# Patient Record
Sex: Female | Born: 1940 | Race: White | Hispanic: No | Smoking: Former smoker
Health system: Southern US, Community
[De-identification: ages and names within clinical notes are randomized; demographics above are authoritative.]

## PROBLEM LIST (undated history)

## (undated) DIAGNOSIS — E119 Type 2 diabetes mellitus without complications: Secondary | ICD-10-CM

## (undated) DIAGNOSIS — Z972 Presence of dental prosthetic device (complete) (partial): Secondary | ICD-10-CM

## (undated) DIAGNOSIS — I2694 Multiple subsegmental pulmonary emboli without acute cor pulmonale: Secondary | ICD-10-CM

## (undated) DIAGNOSIS — F329 Major depressive disorder, single episode, unspecified: Secondary | ICD-10-CM

## (undated) DIAGNOSIS — J449 Chronic obstructive pulmonary disease, unspecified: Secondary | ICD-10-CM

## (undated) DIAGNOSIS — H353131 Nonexudative age-related macular degeneration, bilateral, early dry stage: Secondary | ICD-10-CM

## (undated) DIAGNOSIS — J45909 Unspecified asthma, uncomplicated: Secondary | ICD-10-CM

## (undated) DIAGNOSIS — F419 Anxiety disorder, unspecified: Secondary | ICD-10-CM

## (undated) DIAGNOSIS — C3490 Malignant neoplasm of unspecified part of unspecified bronchus or lung: Secondary | ICD-10-CM

## (undated) DIAGNOSIS — I10 Essential (primary) hypertension: Secondary | ICD-10-CM

## (undated) DIAGNOSIS — K08109 Complete loss of teeth, unspecified cause, unspecified class: Secondary | ICD-10-CM

## (undated) DIAGNOSIS — M8580 Other specified disorders of bone density and structure, unspecified site: Secondary | ICD-10-CM

## (undated) DIAGNOSIS — I719 Aortic aneurysm of unspecified site, without rupture: Secondary | ICD-10-CM

## (undated) DIAGNOSIS — H9191 Unspecified hearing loss, right ear: Secondary | ICD-10-CM

## (undated) DIAGNOSIS — Z974 Presence of external hearing-aid: Secondary | ICD-10-CM

## (undated) DIAGNOSIS — R42 Dizziness and giddiness: Secondary | ICD-10-CM

## (undated) DIAGNOSIS — E1169 Type 2 diabetes mellitus with other specified complication: Secondary | ICD-10-CM

## (undated) DIAGNOSIS — F32A Depression, unspecified: Secondary | ICD-10-CM

## (undated) DIAGNOSIS — Z7901 Long term (current) use of anticoagulants: Secondary | ICD-10-CM

## (undated) DIAGNOSIS — I8393 Asymptomatic varicose veins of bilateral lower extremities: Secondary | ICD-10-CM

## (undated) DIAGNOSIS — E785 Hyperlipidemia, unspecified: Secondary | ICD-10-CM

## (undated) DIAGNOSIS — C801 Malignant (primary) neoplasm, unspecified: Secondary | ICD-10-CM

## (undated) DIAGNOSIS — G510 Bell's palsy: Secondary | ICD-10-CM

## (undated) HISTORY — DX: Malignant (primary) neoplasm, unspecified: C80.1

## (undated) HISTORY — DX: Type 2 diabetes mellitus without complications: E11.9

## (undated) HISTORY — PX: PARTIAL HYSTERECTOMY: SHX80

## (undated) HISTORY — DX: Depression, unspecified: F32.A

## (undated) HISTORY — PX: TUBAL LIGATION: SHX77

## (undated) HISTORY — DX: Bell's palsy: G51.0

## (undated) HISTORY — PX: OTHER SURGICAL HISTORY: SHX169

## (undated) HISTORY — DX: Chronic obstructive pulmonary disease, unspecified: J44.9

## (undated) HISTORY — DX: Essential (primary) hypertension: I10

## (undated) HISTORY — PX: CATARACT EXTRACTION W/ INTRAOCULAR LENS  IMPLANT, BILATERAL: SHX1307

## (undated) HISTORY — DX: Anxiety disorder, unspecified: F41.9

## (undated) HISTORY — DX: Aortic aneurysm of unspecified site, without rupture: I71.9

## (undated) HISTORY — DX: Hyperlipidemia, unspecified: E78.5

## (undated) HISTORY — PX: ABDOMINAL HYSTERECTOMY: SHX81

---
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
## 1974-09-07 HISTORY — PX: BREAST EXCISIONAL BIOPSY: SUR124

## 2005-02-18 ENCOUNTER — Ambulatory Visit: Payer: Self-pay | Admitting: General Practice

## 2007-02-09 ENCOUNTER — Ambulatory Visit: Payer: Self-pay

## 2008-01-26 ENCOUNTER — Ambulatory Visit: Payer: Self-pay | Admitting: Gastroenterology

## 2009-02-07 ENCOUNTER — Ambulatory Visit: Payer: Self-pay | Admitting: Family Medicine

## 2010-01-26 ENCOUNTER — Emergency Department: Payer: Self-pay | Admitting: Emergency Medicine

## 2010-02-06 ENCOUNTER — Ambulatory Visit: Payer: Self-pay | Admitting: Family Medicine

## 2012-09-07 HISTORY — PX: COLONOSCOPY: SHX174

## 2012-12-19 ENCOUNTER — Ambulatory Visit: Payer: Self-pay | Admitting: Family Medicine

## 2013-01-05 ENCOUNTER — Ambulatory Visit: Payer: Self-pay | Admitting: Family Medicine

## 2013-02-05 ENCOUNTER — Ambulatory Visit: Payer: Self-pay | Admitting: Family Medicine

## 2013-03-07 ENCOUNTER — Ambulatory Visit: Payer: Self-pay | Admitting: Family Medicine

## 2013-04-11 ENCOUNTER — Ambulatory Visit: Payer: Self-pay | Admitting: Family Medicine

## 2013-05-04 LAB — HM DEXA SCAN

## 2013-05-08 ENCOUNTER — Ambulatory Visit: Payer: Self-pay | Admitting: Family Medicine

## 2013-05-11 ENCOUNTER — Ambulatory Visit: Payer: Self-pay | Admitting: Family Medicine

## 2013-06-01 ENCOUNTER — Ambulatory Visit: Payer: Self-pay | Admitting: Family Medicine

## 2013-11-23 ENCOUNTER — Ambulatory Visit: Payer: Self-pay | Admitting: Family Medicine

## 2013-11-23 LAB — HM MAMMOGRAPHY

## 2014-06-14 LAB — LIPID PANEL
Cholesterol: 269 mg/dL — AB (ref 0–200)
HDL: 57 mg/dL (ref 35–70)
LDL Cholesterol: 181 mg/dL
Triglycerides: 156 mg/dL (ref 40–160)

## 2014-06-14 LAB — BASIC METABOLIC PANEL
Creatinine: 0.8 mg/dL (ref <=1.1)
Glucose: 129 mg/dL

## 2014-06-14 LAB — HEMOGLOBIN A1C: HEMOGLOBIN A1C: 6.6 % — AB (ref 4.0–6.0)

## 2014-07-20 ENCOUNTER — Ambulatory Visit: Payer: Self-pay | Admitting: Gastroenterology

## 2014-07-20 LAB — HM COLONOSCOPY

## 2014-12-31 DIAGNOSIS — E119 Type 2 diabetes mellitus without complications: Secondary | ICD-10-CM | POA: Insufficient documentation

## 2014-12-31 DIAGNOSIS — M8580 Other specified disorders of bone density and structure, unspecified site: Secondary | ICD-10-CM | POA: Insufficient documentation

## 2014-12-31 DIAGNOSIS — F329 Major depressive disorder, single episode, unspecified: Secondary | ICD-10-CM | POA: Insufficient documentation

## 2014-12-31 DIAGNOSIS — J45909 Unspecified asthma, uncomplicated: Secondary | ICD-10-CM | POA: Insufficient documentation

## 2014-12-31 DIAGNOSIS — I10 Essential (primary) hypertension: Secondary | ICD-10-CM | POA: Insufficient documentation

## 2014-12-31 DIAGNOSIS — F32A Depression, unspecified: Secondary | ICD-10-CM | POA: Insufficient documentation

## 2014-12-31 DIAGNOSIS — Z0000 Encounter for general adult medical examination without abnormal findings: Secondary | ICD-10-CM | POA: Insufficient documentation

## 2014-12-31 DIAGNOSIS — E7849 Other hyperlipidemia: Secondary | ICD-10-CM | POA: Insufficient documentation

## 2015-01-07 DIAGNOSIS — E1143 Type 2 diabetes mellitus with diabetic autonomic (poly)neuropathy: Secondary | ICD-10-CM | POA: Diagnosis not present

## 2015-01-07 DIAGNOSIS — E784 Other hyperlipidemia: Secondary | ICD-10-CM | POA: Diagnosis not present

## 2015-01-07 DIAGNOSIS — F325 Major depressive disorder, single episode, in full remission: Secondary | ICD-10-CM | POA: Diagnosis not present

## 2015-01-07 DIAGNOSIS — I10 Essential (primary) hypertension: Secondary | ICD-10-CM | POA: Diagnosis not present

## 2015-01-07 DIAGNOSIS — M8580 Other specified disorders of bone density and structure, unspecified site: Secondary | ICD-10-CM | POA: Diagnosis not present

## 2015-04-04 ENCOUNTER — Other Ambulatory Visit: Payer: Self-pay | Admitting: Family Medicine

## 2015-04-04 DIAGNOSIS — E119 Type 2 diabetes mellitus without complications: Secondary | ICD-10-CM

## 2015-05-30 ENCOUNTER — Other Ambulatory Visit: Payer: Self-pay | Admitting: Family Medicine

## 2015-06-17 ENCOUNTER — Encounter: Payer: Self-pay | Admitting: Family Medicine

## 2015-06-17 ENCOUNTER — Ambulatory Visit (INDEPENDENT_AMBULATORY_CARE_PROVIDER_SITE_OTHER): Payer: Medicare Other | Admitting: Family Medicine

## 2015-06-17 VITALS — BP 120/82 | HR 64 | Ht 63.0 in | Wt 139.0 lb

## 2015-06-17 DIAGNOSIS — Z1239 Encounter for other screening for malignant neoplasm of breast: Secondary | ICD-10-CM | POA: Diagnosis not present

## 2015-06-17 DIAGNOSIS — R928 Other abnormal and inconclusive findings on diagnostic imaging of breast: Secondary | ICD-10-CM

## 2015-06-17 DIAGNOSIS — E119 Type 2 diabetes mellitus without complications: Secondary | ICD-10-CM

## 2015-06-17 DIAGNOSIS — Z23 Encounter for immunization: Secondary | ICD-10-CM | POA: Diagnosis not present

## 2015-06-17 DIAGNOSIS — E784 Other hyperlipidemia: Secondary | ICD-10-CM

## 2015-06-17 DIAGNOSIS — E7849 Other hyperlipidemia: Secondary | ICD-10-CM

## 2015-06-17 DIAGNOSIS — I10 Essential (primary) hypertension: Secondary | ICD-10-CM | POA: Diagnosis not present

## 2015-06-17 DIAGNOSIS — F419 Anxiety disorder, unspecified: Secondary | ICD-10-CM | POA: Diagnosis not present

## 2015-06-17 DIAGNOSIS — J4520 Mild intermittent asthma, uncomplicated: Secondary | ICD-10-CM

## 2015-06-17 DIAGNOSIS — M8580 Other specified disorders of bone density and structure, unspecified site: Secondary | ICD-10-CM | POA: Diagnosis not present

## 2015-06-17 LAB — POCT URINALYSIS DIPSTICK
Bilirubin, UA: NEGATIVE
GLUCOSE UA: NEGATIVE
Ketones, UA: NEGATIVE
Leukocytes, UA: NEGATIVE
Nitrite, UA: NEGATIVE
PH UA: 6
Protein, UA: NEGATIVE
RBC UA: NEGATIVE
SPEC GRAV UA: 1.01
UROBILINOGEN UA: 0.2

## 2015-06-17 MED ORDER — LISINOPRIL-HYDROCHLOROTHIAZIDE 20-25 MG PO TABS: 1.0000 | ORAL_TABLET | Freq: Every day | ORAL | Status: DC

## 2015-06-17 MED ORDER — ALBUTEROL SULFATE HFA 108 (90 BASE) MCG/ACT IN AERS: 1.0000 | INHALATION_SPRAY | Freq: Four times a day (QID) | RESPIRATORY_TRACT | Status: DC

## 2015-06-17 MED ORDER — METFORMIN HCL 500 MG PO TABS: 500.0000 mg | ORAL_TABLET | Freq: Two times a day (BID) | ORAL | Status: DC

## 2015-06-17 MED ORDER — FLUOXETINE HCL 10 MG PO CAPS: 10.0000 mg | ORAL_CAPSULE | Freq: Every day | ORAL | Status: DC

## 2015-06-17 MED ORDER — ALENDRONATE SODIUM 70 MG PO TABS: 70.0000 mg | ORAL_TABLET | ORAL | Status: DC

## 2015-06-17 NOTE — Progress Notes (Signed)
Patient: Jill Guzman, Female    DOB: 09-24-1940, 74 y.o.   MRN: [id number] Visit Date: 06/17/2015  Today's Provider: Otilio Miu, MD   Chief Complaint  Patient presents with  . Medicare Wellness    needs mammo  . Diabetes  . Hypertension  . Anxiety   Subjective:   Initial preventative physical exam Jill Guzman is a 74 y.o. female who presents today for her Initial Preventative Physical Exam. She feels well. She reports exercising as with yard work. She reports she is sleeping well.  Diabetes She presents for her follow-up diabetic visit. She has type 2 diabetes mellitus. Her disease course has been improving. Pertinent negatives for hypoglycemia include no confusion, dizziness, headaches, nervousness/anxiousness, seizures, speech difficulty or tremors. Pertinent negatives for diabetes include no blurred vision, no chest pain, no fatigue, no foot paresthesias, no foot ulcerations, no polydipsia, no polyphagia, no polyuria, no visual change, no weakness and no weight loss. There are no hypoglycemic complications. Symptoms are stable. There are no diabetic complications. Pertinent negatives for diabetic complications include no CVA, PVD or retinopathy. Current diabetic treatment includes oral agent (monotherapy). Her weight is stable. She is following a generally healthy diet. She participates in exercise intermittently. Her breakfast blood glucose is taken between 7-8 am. Her breakfast blood glucose range is generally 130-140 mg/dl. An ACE inhibitor/angiotensin II receptor blocker is being taken. Eye exam is not current.  Hypertension This is a chronic problem. The current episode started more than 1 year ago. The problem has been gradually improving since onset. The problem is controlled. Associated symptoms include anxiety. Pertinent negatives include no blurred vision, chest pain, headaches, malaise/fatigue, neck pain, orthopnea, palpitations, PND or shortness of breath. There are no  associated agents to hypertension. Risk factors for coronary artery disease include diabetes mellitus, post-menopausal state and dyslipidemia. Past treatments include diuretics. The current treatment provides mild improvement. There is no history of angina, kidney disease, CAD/MI, CVA, heart failure, left ventricular hypertrophy, PVD, renovascular disease or retinopathy. There is no history of chronic renal disease.  Anxiety Presents for follow-up visit. Patient reports no chest pain, compulsions, confusion, depressed mood, dizziness, excessive worry, feeling of choking, insomnia, muscle tension, nausea, nervous/anxious behavior, palpitations, shortness of breath or suicidal ideas. Symptoms occur occasionally. The severity of symptoms is mild. The quality of sleep is fair.   There are no known risk factors. There is no history of anemia, anxiety/panic attacks, arrhythmia, asthma, bipolar disorder, CAD, CHF or depression. Past treatments include SSRIs.    Review of Systems  Constitutional: Negative for fever, chills, weight loss, malaise/fatigue, fatigue and unexpected weight change.  HENT: Negative for ear pain, hearing loss, nosebleeds, sneezing, sore throat and trouble swallowing.   Eyes: Negative for blurred vision, photophobia, pain, redness, itching and visual disturbance.  Respiratory: Negative for cough, chest tightness, shortness of breath and wheezing.   Cardiovascular: Negative for chest pain, palpitations, orthopnea, leg swelling and PND.  Gastrointestinal: Negative for nausea, vomiting, abdominal pain, diarrhea, constipation, blood in stool and rectal pain.  Endocrine: Negative for cold intolerance, heat intolerance, polydipsia, polyphagia and polyuria.  Genitourinary: Negative for dysuria, hematuria, flank pain, vaginal bleeding, vaginal discharge, difficulty urinating, menstrual problem and pelvic pain.  Musculoskeletal: Negative for back pain, joint swelling, neck pain and neck  stiffness.  Skin: Negative for color change and rash.  Allergic/Immunologic: Negative for immunocompromised state.  Neurological: Negative for dizziness, tremors, seizures, syncope, facial asymmetry, speech difficulty, weakness, light-headedness, numbness and headaches.  Hematological: Does not bruise/bleed  easily.  Psychiatric/Behavioral: Negative for suicidal ideas, hallucinations, behavioral problems, confusion, sleep disturbance, self-injury and agitation. The patient is not nervous/anxious and does not have insomnia.     Social History   Social History  . Marital Status: Married    Spouse Name: N/A  . Number of Children: N/A  . Years of Education: N/A   Occupational History  . Not on file.   Social History Main Topics  . Smoking status: Former Research scientist (life sciences)  . Smokeless tobacco: Not on file  . Alcohol Use: No  . Drug Use: No  . Sexual Activity: No   Other Topics Concern  . Not on file   Social History Narrative    Patient Active Problem List   Diagnosis Date Noted  . 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 Routine general medical examination at a health care facility 12/31/2014  . Diabetes ([place]) 12/31/2014  . BP (high blood pressure) 12/31/2014  . Osteopenia 12/31/2014    Past Surgical History  Procedure Laterality Date  . Cyst on bladder removed    . Partial hysterectomy    . Cyst removed breast Left   . Colonoscopy  2014    normal    Her family history includes Diabetes in her mother; Stroke in her mother.    Previous Medications   ACCU-CHEK AVIVA PLUS TEST STRIP    USE ONE STRIP IN VITRO ONCE DAILY   FLAXSEED, LINSEED, (FLAX SEED OIL) 1000 MG CAPS    Take 1 capsule by mouth daily.    Patient Care Team: Juline Patch, MD as PCP - General (Family Medicine)     Objective:   Vitals: BP 120/82 mmHg  Pulse 64  Ht '5\' 3"'$  (1.6 m)  Wt 139 lb (63.05 kg)  BMI  24.63 kg/m2  Physical Exam  Constitutional: She is oriented to person, place, and time. She appears well-developed and well-nourished.  HENT:  Head: Normocephalic.  Right Ear: External ear normal.  Left Ear: External ear normal.  Nose: Nose normal.  Eyes: Conjunctivae and EOM are normal. Pupils are equal, round, and reactive to light.  Neck: Normal range of motion. Neck supple.  Cardiovascular: Normal rate, regular rhythm, normal heart sounds and intact distal pulses.   Pulmonary/Chest: Effort normal and breath sounds normal. Right breast exhibits no inverted nipple, no mass, no nipple discharge, no skin change and no tenderness. Left breast exhibits no inverted nipple, no mass, no nipple discharge, no skin change and no tenderness. Breasts are symmetrical.  Abdominal: Soft. Bowel sounds are normal.  Genitourinary: Vagina normal and uterus normal.  Musculoskeletal: Normal range of motion.  Neurological: She is alert and oriented to person, place, and time. She has normal reflexes.  Skin: Skin is warm and dry.  Psychiatric: She has a normal mood and affect. Her behavior is normal. Judgment and thought content normal.     No exam data present  Activities of Daily Living In your present state of health, do you have any difficulty performing the following activities: 06/17/2015  Hearing? N  Vision? N  Difficulty concentrating or making decisions? N  Walking or climbing stairs? N  Dressing or bathing? N  Doing errands, shopping? N    Fall Risk Assessment Fall Risk  06/17/2015  Falls in the past year? No     Patient reports there are safety devices in place in shower at home.  Depression Screen PHQ 2/9 Scores 06/17/2015  PHQ -  2 Score 1    Cognitive Testing - 6-CIT   Correct? Score   What year is it? yes 0 Yes = 0    No = 4  What month is it? yes 0 Yes = 0    No = 3  Remember:     Pia [address], [place], [place]     What time is it? yes 0 Yes = 0    No = 3  Count  backwards from 20 to 1 yes 0 Correct = 0    1 error = 2   More than 1 error = 4  Say the months of the year in reverse. yes 0 Correct = 0    1 error = 2   More than 1 error = 4  What address did I ask you to remember? yes 0 Correct = 0  1 error = 2    2 error = 4    3 error = 6    4 error = 8    All wrong = 10       TOTAL SCORE  0/28   Interpretation:  Normal  Normal (0-7) Abnormal (8-28)     Assessment & Plan:     Initial Preventative Physical Exam  Reviewed patient's Family Medical History Reviewed and updated list of patient's medical providers Assessment of cognitive impairment was done Assessed patient's functional ability Established a written schedule for health screening [place] Completed and Reviewed  Exercise Activities and Dietary recommendations Goals    None      Immunization History  Administered Date(s) Administered  . Influenza-Unspecified 06/07/2015  . Pneumococcal Conjugate-13 06/17/2015  . Pneumococcal Polysaccharide-23 06/14/2014  . Td 09/07/2006  . Zoster 09/07/2012    Health Maintenance  Topic Date Due  . FOOT EXAM  12/11/1952  . OPHTHALMOLOGY EXAM  12/11/1952  . URINE MICROALBUMIN  12/11/1952  . HEMOGLOBIN A1C  12/14/2014  . MAMMOGRAM  11/24/2015  . INFLUENZA VACCINE  04/07/2016  . TETANUS/TDAP  09/07/2016  . COLONOSCOPY  07/20/2024  . DEXA SCAN  Completed  . ZOSTAVAX  Completed  . PNA vac Low Risk Adult  Completed      Discussed health benefits of physical activity, and encouraged her to engage in regular exercise appropriate for her age and condition.    ------------------------------------------------------------------------------------------------------------   Problem List Items Addressed This Visit      Cardiovascular and Mediastinum   BP (high blood pressure) - Primary   Relevant Medications   lisinopril-hydrochlorothiazide (PRINZIDE,ZESTORETIC) 20-25 MG tablet   Other Relevant Orders   Lipid Profile    Renal Function Panel   POCT Urinalysis Dipstick (Completed)     Respiratory   AB (asthmatic bronchitis)   Relevant Medications   albuterol (PROVENTIL HFA;VENTOLIN HFA) 108 (90 BASE) MCG/ACT inhaler     Endocrine   Diabetes (HCC)   Relevant Medications   lisinopril-hydrochlorothiazide (PRINZIDE,ZESTORETIC) 20-25 MG tablet   metFORMIN (GLUCOPHAGE) 500 MG tablet   Other Relevant Orders   HgB A1c   Lipid Profile     Musculoskeletal and Integument   Osteopenia   Relevant Medications   alendronate (FOSAMAX) 70 MG tablet   Other Relevant Orders   DG Bone Density     Other   Familial multiple lipoprotein-type hyperlipidemia   Relevant Medications   lisinopril-hydrochlorothiazide (PRINZIDE,ZESTORETIC) 20-25 MG tablet   Other Relevant Orders   Lipid Profile    Other Visit Diagnoses    Chronic anxiety  Relevant Medications    FLUoxetine (PROZAC) 10 MG capsule    Breast cancer screening        Relevant Orders    MM Digital Screening    Need for pneumococcal vaccination        Relevant Orders    Pneumococcal conjugate vaccine 13-valent (Completed)    Abnormal mammogram of both breasts        Relevant Orders    MM Digital Diagnostic Bilat    US BREAST LTD UNI LEFT INC AXILLA    US BREAST LTD UNI RIGHT INC AXILLA        Otilio Miu, MD [facility]  06/17/2015

## 2015-06-18 LAB — LIPID PANEL
Chol/HDL Ratio: 4.5 ratio units — ABNORMAL HIGH (ref 0.0–4.4)
Cholesterol, Total: 272 mg/dL — ABNORMAL HIGH (ref 100–199)
HDL: 60 mg/dL (ref >39)
LDL CALC: 185 mg/dL — AB (ref 0–99)
Triglycerides: 137 mg/dL (ref 0–149)
VLDL CHOLESTEROL CAL: 27 mg/dL (ref 5–40)

## 2015-06-18 LAB — RENAL FUNCTION PANEL
ALBUMIN: 4.7 g/dL (ref 3.5–4.8)
BUN/Creatinine Ratio: 21 (ref 11–26)
BUN: 15 mg/dL (ref 8–27)
CO2: 26 mmol/L (ref 18–29)
CREATININE: 0.71 mg/dL (ref 0.57–1.00)
Calcium: 10.6 mg/dL — ABNORMAL HIGH (ref 8.7–10.3)
Chloride: 97 mmol/L (ref 97–108)
GFR calc Af Amer: 98 mL/min/{1.73_m2} (ref >59)
GFR calc non Af Amer: 85 mL/min/{1.73_m2} (ref >59)
GLUCOSE: 148 mg/dL — AB (ref 65–99)
Phosphorus: 2.9 mg/dL (ref 2.5–4.5)
Potassium: 4.6 mmol/L (ref 3.5–5.2)
SODIUM: 140 mmol/L (ref 134–144)

## 2015-06-18 LAB — HEMOGLOBIN A1C
Est. average glucose Bld gHb Est-mCnc: 160 mg/dL
Hgb A1c MFr Bld: 7.2 % — ABNORMAL HIGH (ref 4.8–5.6)

## 2015-06-28 ENCOUNTER — Ambulatory Visit
Admission: RE | Admit: 2015-06-28 | Discharge: 2015-06-28 | Disposition: A | Discharge status: Home / Self Care | Payer: Medicare Other | Source: Ambulatory Visit | Attending: Family Medicine | Admitting: Family Medicine

## 2015-06-28 ENCOUNTER — Other Ambulatory Visit: Payer: Self-pay | Admitting: Family Medicine

## 2015-06-28 DIAGNOSIS — R928 Other abnormal and inconclusive findings on diagnostic imaging of breast: Secondary | ICD-10-CM | POA: Diagnosis not present

## 2015-06-28 DIAGNOSIS — R921 Mammographic calcification found on diagnostic imaging of breast: Secondary | ICD-10-CM | POA: Diagnosis not present

## 2015-07-03 ENCOUNTER — Ambulatory Visit
Admission: RE | Admit: 2015-07-03 | Discharge: 2015-07-03 | Disposition: A | Discharge status: Home / Self Care | Payer: Medicare Other | Source: Ambulatory Visit | Attending: Family Medicine | Admitting: Family Medicine

## 2015-07-03 DIAGNOSIS — Z780 Asymptomatic menopausal state: Secondary | ICD-10-CM | POA: Diagnosis not present

## 2015-07-03 DIAGNOSIS — M8580 Other specified disorders of bone density and structure, unspecified site: Secondary | ICD-10-CM | POA: Diagnosis not present

## 2015-08-19 ENCOUNTER — Other Ambulatory Visit: Payer: Self-pay

## 2015-08-20 ENCOUNTER — Other Ambulatory Visit: Payer: Medicare Other

## 2015-08-20 DIAGNOSIS — E785 Hyperlipidemia, unspecified: Secondary | ICD-10-CM | POA: Diagnosis not present

## 2015-08-20 DIAGNOSIS — E119 Type 2 diabetes mellitus without complications: Secondary | ICD-10-CM

## 2015-08-21 LAB — LIPID PANEL
CHOL/HDL RATIO: 4.7 ratio — AB (ref 0.0–4.4)
Cholesterol, Total: 238 mg/dL — ABNORMAL HIGH (ref 100–199)
HDL: 51 mg/dL (ref >39)
LDL CALC: 161 mg/dL — AB (ref 0–99)
TRIGLYCERIDES: 132 mg/dL (ref 0–149)
VLDL CHOLESTEROL CAL: 26 mg/dL (ref 5–40)

## 2015-08-21 LAB — HEMOGLOBIN A1C
Est. average glucose Bld gHb Est-mCnc: 157 mg/dL
HEMOGLOBIN A1C: 7.1 % — AB (ref 4.8–5.6)

## 2015-08-23 ENCOUNTER — Other Ambulatory Visit: Payer: Self-pay

## 2015-10-25 ENCOUNTER — Other Ambulatory Visit: Payer: Self-pay | Admitting: Family Medicine

## 2015-11-04 ENCOUNTER — Encounter: Payer: Self-pay | Admitting: Family Medicine

## 2015-11-04 ENCOUNTER — Ambulatory Visit (INDEPENDENT_AMBULATORY_CARE_PROVIDER_SITE_OTHER): Payer: Medicare Other | Admitting: Family Medicine

## 2015-11-04 VITALS — BP 138/80 | HR 76 | Temp 98.1°F | Ht 63.0 in | Wt 135.0 lb

## 2015-11-04 DIAGNOSIS — J219 Acute bronchiolitis, unspecified: Secondary | ICD-10-CM

## 2015-11-04 MED ORDER — ALBUTEROL SULFATE HFA 108 (90 BASE) MCG/ACT IN AERS: 2.0000 | INHALATION_SPRAY | Freq: Four times a day (QID) | RESPIRATORY_TRACT | Status: DC | PRN

## 2015-11-04 MED ORDER — GUAIFENESIN-CODEINE 100-10 MG/5ML PO SYRP: 5.0000 mL | ORAL_SOLUTION | Freq: Three times a day (TID) | ORAL | Status: DC | PRN

## 2015-11-04 MED ORDER — DOXYCYCLINE HYCLATE 100 MG PO TABS: 100.0000 mg | ORAL_TABLET | Freq: Two times a day (BID) | ORAL | Status: DC

## 2015-11-04 NOTE — Progress Notes (Signed)
Name: Jill Guzman   MRN: [id number]    DOB: [birth year]   Date:11/04/2015       Progress Note  Subjective  Chief Complaint  Chief Complaint  Patient presents with  . Bronchitis    cough and cong- yellow production    Cough This is a new problem. The current episode started in the past 7 days. The problem has been gradually worsening. The problem occurs every few minutes. The cough is productive of purulent sputum (yellow-green). Associated symptoms include chills, a fever, myalgias, nasal congestion, postnasal drip, shortness of breath and wheezing. Pertinent negatives include no chest pain, ear congestion, ear pain, headaches, heartburn, hemoptysis, rash, sore throat or weight loss. The symptoms are aggravated by cold air. She has tried a beta-agonist inhaler for the symptoms. The treatment provided mild relief. Her past medical history is significant for asthma. There is no history of environmental allergies.    No problem-specific assessment & plan notes found for this encounter.   Past Medical History  Diagnosis Date  . COPD (chronic obstructive pulmonary disease) ([place])   . Hypertension   . Hyperlipidemia   . Diabetes mellitus without complication ([place])   . Anxiety     Past Surgical History  Procedure Laterality Date  . Cyst on bladder removed    . Partial hysterectomy    . Cyst removed breast Left   . Colonoscopy  2014    normal  . Breast excisional biopsy Left 1976    neg    Family History  Problem Relation Age of Onset  . Diabetes Mother   . Stroke Mother     Social History   Social History  . Marital Status: Married    Spouse Name: N/A  . Number of Children: N/A  . Years of Education: N/A   Occupational History  . Not on file.   Social History Main Topics  . Smoking status: Former Research scientist (life sciences)  . Smokeless tobacco: Not on file  . Alcohol Use: No  . Drug Use: No  . Sexual Activity: No   Other Topics Concern  . Not on file   Social History  Narrative    Allergies  Allergen Reactions  . Penicillins   . Sulfa Antibiotics      Review of Systems  Constitutional: Positive for fever and chills. Negative for weight loss and malaise/fatigue.  HENT: Positive for postnasal drip. Negative for ear discharge, ear pain and sore throat.   Eyes: Negative for blurred vision.  Respiratory: Positive for cough, shortness of breath and wheezing. Negative for hemoptysis and sputum production.   Cardiovascular: Negative for chest pain, palpitations and leg swelling.  Gastrointestinal: Negative for heartburn, nausea, abdominal pain, diarrhea, constipation, blood in stool and melena.  Genitourinary: Negative for dysuria, urgency, frequency and hematuria.  Musculoskeletal: Positive for myalgias. Negative for back pain, joint pain and neck pain.  Skin: Negative for rash.  Neurological: Negative for dizziness, tingling, sensory change, focal weakness and headaches.  Endo/Heme/Allergies: Negative for environmental allergies and polydipsia. Does not bruise/bleed easily.  Psychiatric/Behavioral: Negative for depression and suicidal ideas. The patient is not nervous/anxious and does not have insomnia.      Objective  Filed Vitals:   11/04/15 1141  BP: 138/80  Pulse: 76  Temp: 98.1 F (36.7 C)  TempSrc: Oral  Height: '5\' 3"'$  (1.6 m)  Weight: 135 lb (61.236 kg)    Physical Exam  Constitutional: She is well-developed, well-nourished, and in no distress. No distress.  HENT:  Head:  Normocephalic and atraumatic.  Right Ear: External ear normal.  Left Ear: External ear normal.  Nose: Nose normal.  Mouth/Throat: Oropharynx is clear and moist.  Eyes: Conjunctivae and EOM are normal. Pupils are equal, round, and reactive to light. Right eye exhibits no discharge. Left eye exhibits no discharge.  Neck: Normal range of motion. Neck supple. No JVD present. No thyromegaly present.  Cardiovascular: Normal rate, regular rhythm, normal heart sounds and  intact distal pulses.  Exam reveals no gallop and no friction rub.   No murmur heard. Pulmonary/Chest: Effort normal. No respiratory distress. She has wheezes. She has no rales. She exhibits no tenderness.  Abdominal: Soft. Bowel sounds are normal. She exhibits no mass. There is no tenderness. There is no guarding.  Musculoskeletal: Normal range of motion. She exhibits no edema.  Lymphadenopathy:    She has no cervical adenopathy.  Neurological: She is alert. She has normal reflexes.  Skin: Skin is warm and dry. She is not diaphoretic.  Psychiatric: Mood and affect normal.      Assessment & Plan  Problem List Items Addressed This Visit    None    Visit Diagnoses    Bronchiolitis    -  Primary    Relevant Medications    guaiFENesin-codeine (ROBITUSSIN AC) 100-10 MG/5ML syrup    doxycycline (VIBRA-TABS) 100 MG tablet    albuterol (PROVENTIL HFA;VENTOLIN HFA) 108 (90 Base) MCG/ACT inhaler         Dr. Ashlyne Olenick [facility]  11/04/2015

## 2015-12-02 ENCOUNTER — Encounter: Payer: Self-pay | Admitting: Family Medicine

## 2015-12-02 ENCOUNTER — Ambulatory Visit (INDEPENDENT_AMBULATORY_CARE_PROVIDER_SITE_OTHER): Payer: Medicare Other | Admitting: Family Medicine

## 2015-12-02 VITALS — BP 138/80 | HR 60 | Ht 63.0 in | Wt 138.0 lb

## 2015-12-02 DIAGNOSIS — M8580 Other specified disorders of bone density and structure, unspecified site: Secondary | ICD-10-CM | POA: Diagnosis not present

## 2015-12-02 DIAGNOSIS — F329 Major depressive disorder, single episode, unspecified: Secondary | ICD-10-CM

## 2015-12-02 DIAGNOSIS — M545 Low back pain, unspecified: Secondary | ICD-10-CM

## 2015-12-02 DIAGNOSIS — F419 Anxiety disorder, unspecified: Secondary | ICD-10-CM | POA: Diagnosis not present

## 2015-12-02 DIAGNOSIS — E784 Other hyperlipidemia: Secondary | ICD-10-CM

## 2015-12-02 DIAGNOSIS — I10 Essential (primary) hypertension: Secondary | ICD-10-CM | POA: Diagnosis not present

## 2015-12-02 DIAGNOSIS — E119 Type 2 diabetes mellitus without complications: Secondary | ICD-10-CM | POA: Diagnosis not present

## 2015-12-02 DIAGNOSIS — E7849 Other hyperlipidemia: Secondary | ICD-10-CM

## 2015-12-02 DIAGNOSIS — J4520 Mild intermittent asthma, uncomplicated: Secondary | ICD-10-CM

## 2015-12-02 DIAGNOSIS — F32A Depression, unspecified: Secondary | ICD-10-CM

## 2015-12-02 MED ORDER — CYCLOBENZAPRINE HCL 10 MG PO TABS: 10.0000 mg | ORAL_TABLET | Freq: Three times a day (TID) | ORAL | Status: DC | PRN

## 2015-12-02 MED ORDER — METFORMIN HCL 500 MG PO TABS: 500.0000 mg | ORAL_TABLET | Freq: Two times a day (BID) | ORAL | Status: DC

## 2015-12-02 MED ORDER — LISINOPRIL-HYDROCHLOROTHIAZIDE 20-25 MG PO TABS: 1.0000 | ORAL_TABLET | Freq: Every day | ORAL | Status: DC

## 2015-12-02 MED ORDER — ALBUTEROL SULFATE HFA 108 (90 BASE) MCG/ACT IN AERS: 1.0000 | INHALATION_SPRAY | Freq: Four times a day (QID) | RESPIRATORY_TRACT | Status: DC

## 2015-12-02 MED ORDER — OMEGA 3 1000 MG PO CAPS: 1.0000 | ORAL_CAPSULE | Freq: Every day | ORAL | Status: DC

## 2015-12-02 MED ORDER — ALENDRONATE SODIUM 70 MG PO TABS: 70.0000 mg | ORAL_TABLET | ORAL | Status: DC

## 2015-12-02 MED ORDER — FLUOXETINE HCL 10 MG PO CAPS: 10.0000 mg | ORAL_CAPSULE | Freq: Every day | ORAL | Status: DC

## 2015-12-02 NOTE — Progress Notes (Signed)
Name: Jill Guzman   MRN: [id number]    DOB: [birth year]   Date:12/02/2015       Progress Note  Subjective  Chief Complaint  Chief Complaint  Patient presents with  . Hypertension  . Diabetes  . Osteoporosis  . Anxiety    Hypertension This is a chronic problem. The current episode started more than 1 year ago. The problem has been waxing and waning since onset. The problem is controlled. Associated symptoms include anxiety. Pertinent negatives include no blurred vision, chest pain, headaches, malaise/fatigue, neck pain, orthopnea, palpitations, peripheral edema, PND, shortness of breath or sweats. There are no associated agents to hypertension. Risk factors for coronary artery disease include diabetes mellitus, dyslipidemia and post-menopausal state. Past treatments include ACE inhibitors and diuretics. The current treatment provides mild improvement. There are no compliance problems.  There is no history of angina, kidney disease, CAD/MI, CVA, heart failure, left ventricular hypertrophy, PVD, renovascular disease or retinopathy. There is no history of chronic renal disease or a hypertension causing med.  Diabetes She presents for her follow-up diabetic visit. She has type 2 diabetes mellitus. Her disease course has been improving. Pertinent negatives for hypoglycemia include no confusion, dizziness, headaches, hunger, mood changes, nervousness/anxiousness, pallor, seizures, sleepiness, speech difficulty, sweats or tremors. Pertinent negatives for diabetes include no blurred vision, no chest pain, no fatigue, no foot paresthesias, no foot ulcerations, no polydipsia, no polyphagia, no polyuria, no visual change, no weakness and no weight loss. There are no hypoglycemic complications. Symptoms are stable. Pertinent negatives for diabetic complications include no CVA, PVD or retinopathy. Risk factors for coronary artery disease include diabetes mellitus and dyslipidemia. Current diabetic treatment  includes oral agent (monotherapy). She is compliant with treatment all of the time. Her weight is stable. She is following a generally healthy diet. She has not had a previous visit with a dietitian. Her home blood glucose trend is fluctuating minimally. Her breakfast blood glucose is taken between 7-8 am. Her breakfast blood glucose range is generally 110-130 mg/dl. An ACE inhibitor/angiotensin II receptor blocker is being taken. She does not see a podiatrist.Eye exam is not current.  Anxiety Presents for follow-up visit. Symptoms include depressed mood and excessive worry. Patient reports no chest pain, confusion, decreased concentration, dizziness, insomnia, irritability, nausea, nervous/anxious behavior, palpitations, shortness of breath or suicidal ideas. Symptoms occur most days. The severity of symptoms is mild.   Her past medical history is significant for anxiety/panic attacks and depression. There is no history of anemia, arrhythmia, asthma or bipolar disorder. Past treatments include SSRIs. Compliance with prior treatments has been good.  Back Pain This is a recurrent problem. The current episode started more than 1 year ago. The problem has been waxing and waning since onset. The pain is present in the lumbar spine. The pain does not radiate. Pertinent negatives include no abdominal pain, chest pain, dysuria, fever, headaches, tingling, weakness or weight loss.    No problem-specific assessment & plan notes found for this encounter.   Past Medical History  Diagnosis Date  . COPD (chronic obstructive pulmonary disease) ([place])   . Hypertension   . Hyperlipidemia   . Diabetes mellitus without complication ([place])   . Anxiety     Past Surgical History  Procedure Laterality Date  . Cyst on bladder removed    . Partial hysterectomy    . Cyst removed breast Left   . Colonoscopy  2014    normal  . Breast excisional biopsy Left 1976  neg    Family History  Problem Relation Age of  Onset  . Diabetes Mother   . Stroke Mother     Social History   Social History  . Marital Status: Married    Spouse Name: N/A  . Number of Children: N/A  . Years of Education: N/A   Occupational History  . Not on file.   Social History Main Topics  . Smoking status: Former Research scientist (life sciences)  . Smokeless tobacco: Not on file  . Alcohol Use: No  . Drug Use: No  . Sexual Activity: No   Other Topics Concern  . Not on file   Social History Narrative    Allergies  Allergen Reactions  . Penicillins   . Sulfa Antibiotics      Review of Systems  Constitutional: Negative for fever, chills, weight loss, malaise/fatigue, irritability and fatigue.  HENT: Negative for ear discharge, ear pain and sore throat.   Eyes: Negative for blurred vision.  Respiratory: Negative for cough, sputum production, shortness of breath and wheezing.   Cardiovascular: Negative for chest pain, palpitations, orthopnea, leg swelling and PND.  Gastrointestinal: Negative for heartburn, nausea, abdominal pain, diarrhea, constipation, blood in stool and melena.  Genitourinary: Negative for dysuria, urgency, frequency and hematuria.  Musculoskeletal: Negative for myalgias, back pain, joint pain and neck pain.  Skin: Negative for pallor and rash.  Neurological: Negative for dizziness, tingling, tremors, sensory change, focal weakness, seizures, speech difficulty, weakness and headaches.  Endo/Heme/Allergies: Negative for environmental allergies, polydipsia and polyphagia. Does not bruise/bleed easily.  Psychiatric/Behavioral: Negative for depression, suicidal ideas, confusion and decreased concentration. The patient is not nervous/anxious and does not have insomnia.      Objective  Filed Vitals:   12/02/15 0911  BP: 138/80  Pulse: 60  Height: '5\' 3"'$  (1.6 m)  Weight: 138 lb (62.596 kg)    Physical Exam  Constitutional: She is well-developed, well-nourished, and in no distress. No distress.  HENT:  Head:  Normocephalic and atraumatic.  Right Ear: External ear normal.  Left Ear: External ear normal.  Nose: Nose normal.  Mouth/Throat: Oropharynx is clear and moist.  Eyes: Conjunctivae and EOM are normal. Pupils are equal, round, and reactive to light. Right eye exhibits no discharge. Left eye exhibits no discharge.  Neck: Normal range of motion. Neck supple. No JVD present. No thyromegaly present.  Cardiovascular: Normal rate, regular rhythm, normal heart sounds and intact distal pulses.  Exam reveals no gallop and no friction rub.   No murmur heard. Pulmonary/Chest: Effort normal and breath sounds normal.  Abdominal: Soft. Bowel sounds are normal. She exhibits no mass. There is no tenderness. There is no guarding.  Musculoskeletal: Normal range of motion. She exhibits no edema.  Lymphadenopathy:    She has no cervical adenopathy.  Neurological: She is alert. She has normal reflexes.  Skin: Skin is warm and dry. She is not diaphoretic.  Psychiatric: Mood and affect normal.  Nursing note and vitals reviewed.     Assessment & Plan  Problem List Items Addressed This Visit      Cardiovascular and Mediastinum   BP (high blood pressure) - Primary   Relevant Medications   lisinopril-hydrochlorothiazide (PRINZIDE,ZESTORETIC) 20-25 MG tablet   Other Relevant Orders   Renal Function Panel     Respiratory   AB (asthmatic bronchitis)   Relevant Medications   albuterol (PROVENTIL HFA;VENTOLIN HFA) 108 (90 Base) MCG/ACT inhaler     Endocrine   Diabetes (HCC)   Relevant Medications   lisinopril-hydrochlorothiazide (  PRINZIDE,ZESTORETIC) 20-25 MG tablet   metFORMIN (GLUCOPHAGE) 500 MG tablet     Musculoskeletal and Integument   Osteopenia   Relevant Medications   alendronate (FOSAMAX) 70 MG tablet     Other   Familial multiple lipoprotein-type hyperlipidemia   Relevant Medications   lisinopril-hydrochlorothiazide (PRINZIDE,ZESTORETIC) 20-25 MG tablet   Omega 3 1000 MG CAPS   Other  Relevant Orders   Lipid Profile   Clinical depression   Relevant Medications   FLUoxetine (PROZAC) 10 MG capsule    Other Visit Diagnoses    Chronic anxiety        Relevant Medications    FLUoxetine (PROZAC) 10 MG capsule    Bilateral low back pain without sciatica        Relevant Medications    cyclobenzaprine (FLEXERIL) 10 MG tablet      Added A1C to labs   Dr. Mac[facility]  12/02/2015

## 2015-12-03 LAB — RENAL FUNCTION PANEL
Albumin: 4.3 g/dL (ref 3.5–4.8)
BUN / CREAT RATIO: 13 (ref 11–26)
BUN: 10 mg/dL (ref 8–27)
CALCIUM: 9.4 mg/dL (ref 8.7–10.3)
CHLORIDE: 100 mmol/L (ref 96–106)
CO2: 23 mmol/L (ref 18–29)
Creatinine, Ser: 0.8 mg/dL (ref 0.57–1.00)
GFR calc non Af Amer: 74 mL/min/{1.73_m2} (ref >59)
GFR, EST AFRICAN AMERICAN: 85 mL/min/{1.73_m2} (ref >59)
GLUCOSE: 122 mg/dL — AB (ref 65–99)
Phosphorus: 2.6 mg/dL (ref 2.5–4.5)
Potassium: 4.5 mmol/L (ref 3.5–5.2)
SODIUM: 140 mmol/L (ref 134–144)

## 2015-12-03 LAB — LIPID PANEL
CHOLESTEROL TOTAL: 256 mg/dL — AB (ref 100–199)
Chol/HDL Ratio: 4.7 ratio units — ABNORMAL HIGH (ref 0.0–4.4)
HDL: 55 mg/dL (ref >39)
LDL Calculated: 177 mg/dL — ABNORMAL HIGH (ref 0–99)
Triglycerides: 121 mg/dL (ref 0–149)
VLDL Cholesterol Cal: 24 mg/dL (ref 5–40)

## 2015-12-03 LAB — HGB A1C W/O EAG: HEMOGLOBIN A1C: 7.1 % — AB (ref 4.8–5.6)

## 2016-01-18 ENCOUNTER — Other Ambulatory Visit: Payer: Self-pay | Admitting: Family Medicine

## 2016-01-28 ENCOUNTER — Other Ambulatory Visit: Payer: Self-pay

## 2016-04-06 DIAGNOSIS — E119 Type 2 diabetes mellitus without complications: Secondary | ICD-10-CM | POA: Diagnosis not present

## 2016-04-27 ENCOUNTER — Other Ambulatory Visit: Payer: Self-pay

## 2016-04-28 ENCOUNTER — Other Ambulatory Visit: Payer: Self-pay | Admitting: Family Medicine

## 2016-07-14 ENCOUNTER — Encounter: Payer: Self-pay | Admitting: Family Medicine

## 2016-07-14 ENCOUNTER — Ambulatory Visit (INDEPENDENT_AMBULATORY_CARE_PROVIDER_SITE_OTHER): Payer: Medicare Other | Admitting: Family Medicine

## 2016-07-14 VITALS — BP 120/88 | HR 76 | Ht 63.0 in | Wt 132.0 lb

## 2016-07-14 DIAGNOSIS — E784 Other hyperlipidemia: Secondary | ICD-10-CM | POA: Diagnosis not present

## 2016-07-14 DIAGNOSIS — Z23 Encounter for immunization: Secondary | ICD-10-CM | POA: Diagnosis not present

## 2016-07-14 DIAGNOSIS — I10 Essential (primary) hypertension: Secondary | ICD-10-CM | POA: Diagnosis not present

## 2016-07-14 DIAGNOSIS — E119 Type 2 diabetes mellitus without complications: Secondary | ICD-10-CM | POA: Diagnosis not present

## 2016-07-14 DIAGNOSIS — F419 Anxiety disorder, unspecified: Secondary | ICD-10-CM | POA: Diagnosis not present

## 2016-07-14 DIAGNOSIS — J4520 Mild intermittent asthma, uncomplicated: Secondary | ICD-10-CM

## 2016-07-14 DIAGNOSIS — M8580 Other specified disorders of bone density and structure, unspecified site: Secondary | ICD-10-CM | POA: Diagnosis not present

## 2016-07-14 DIAGNOSIS — J449 Chronic obstructive pulmonary disease, unspecified: Secondary | ICD-10-CM | POA: Insufficient documentation

## 2016-07-14 DIAGNOSIS — E7849 Other hyperlipidemia: Secondary | ICD-10-CM

## 2016-07-14 MED ORDER — LISINOPRIL-HYDROCHLOROTHIAZIDE 20-25 MG PO TABS: 1.0000 | ORAL_TABLET | Freq: Every day | ORAL | 1 refills | Status: DC

## 2016-07-14 MED ORDER — GLUCOSE BLOOD VI STRP: ORAL_STRIP | 6 refills | Status: DC

## 2016-07-14 MED ORDER — OMEGA 3 1000 MG PO CAPS: 1.0000 | ORAL_CAPSULE | Freq: Every day | ORAL | 3 refills | Status: DC

## 2016-07-14 MED ORDER — FLUOXETINE HCL 10 MG PO CAPS: 10.0000 mg | ORAL_CAPSULE | Freq: Every day | ORAL | 1 refills | Status: DC

## 2016-07-14 MED ORDER — ALBUTEROL SULFATE HFA 108 (90 BASE) MCG/ACT IN AERS: 1.0000 | INHALATION_SPRAY | Freq: Four times a day (QID) | RESPIRATORY_TRACT | 11 refills | Status: DC

## 2016-07-14 MED ORDER — METFORMIN HCL 500 MG PO TABS: 500.0000 mg | ORAL_TABLET | Freq: Two times a day (BID) | ORAL | 1 refills | Status: DC

## 2016-07-14 NOTE — Progress Notes (Signed)
Name: Jill Guzman   MRN: [id number]    DOB: October 20, 1940   Date:07/14/2016       Progress Note  Subjective  Chief Complaint  Chief Complaint  Patient presents with  . Diabetes  . COPD  . Hypertension  . Hyperlipidemia  . Anxiety  . Dizziness    roll over in bed on R) side or walking down hall- gets dizzy    Diabetes  She presents for her follow-up diabetic visit. She has type 2 diabetes mellitus. Her disease course has been stable. Hypoglycemia symptoms include dizziness and mood changes. Pertinent negatives for hypoglycemia include no confusion, headaches, nervousness/anxiousness, pallor, seizures, sleepiness, speech difficulty, sweats or tremors. Pertinent negatives for diabetes include no blurred vision, no chest pain, no fatigue, no foot paresthesias, no foot ulcerations, no polydipsia, no polyphagia, no polyuria, no visual change, no weakness and no weight loss. There are no hypoglycemic complications. Symptoms are stable. There are no diabetic complications. Pertinent negatives for diabetic complications include no CVA, impotence, PVD or retinopathy. Current diabetic treatment includes oral agent (monotherapy). Her weight is stable. She is following a generally healthy diet. She participates in exercise daily. Her home blood glucose trend is fluctuating minimally. Her breakfast blood glucose is taken between 8-9 am. Her breakfast blood glucose range is generally 130-140 mg/dl. An ACE inhibitor/angiotensin II receptor blocker is being taken. She does not see a podiatrist.Eye exam is not current.  Hypertension  This is a chronic problem. The current episode started in the past 7 days. The problem has been waxing and waning since onset. The problem is controlled. Associated symptoms include anxiety. Pertinent negatives include no blurred vision, chest pain, headaches, malaise/fatigue, neck pain, orthopnea, palpitations, peripheral edema, PND, shortness of breath or sweats. There are no  associated agents to hypertension. Risk factors for coronary artery disease include diabetes mellitus and dyslipidemia. Past treatments include ACE inhibitors and diuretics. The current treatment provides mild improvement. There are no compliance problems.  There is no history of angina, kidney disease, CAD/MI, CVA, heart failure, left ventricular hypertrophy, PVD, renovascular disease or retinopathy. There is no history of chronic renal disease or a hypertension causing med.  Hyperlipidemia  This is a chronic problem. The problem is controlled. Recent lipid tests were reviewed and are normal. She has no history of chronic renal disease, diabetes, hypothyroidism, liver disease or nephrotic syndrome. Factors aggravating her hyperlipidemia include thiazides. Pertinent negatives include no chest pain, focal sensory loss, focal weakness, leg pain, myalgias or shortness of breath. The current treatment provides mild improvement of lipids. There are no compliance problems.  Risk factors for coronary artery disease include diabetes mellitus, dyslipidemia, hypertension, stress and post-menopausal.  Anxiety  Presents for follow-up visit. Symptoms include depressed mood and dizziness. Patient reports no chest pain, compulsions, confusion, decreased concentration, dry mouth, excessive worry, feeling of choking, hyperventilation, impotence, insomnia, irritability, malaise, muscle tension, nausea, nervous/anxious behavior, obsessions, palpitations, panic, restlessness, shortness of breath or suicidal ideas. The severity of symptoms is mild. The quality of sleep is good.    Dizziness  This is a new problem. The current episode started more than 1 month ago. The problem occurs intermittently. The problem has been waxing and waning. Associated symptoms include vertigo. Pertinent negatives include no abdominal pain, arthralgias, chest pain, chills, congestion, coughing, fatigue, fever, headaches, joint swelling, myalgias,  nausea, neck pain, rash, sore throat, visual change or weakness.    No problem-specific Assessment & Plan notes found for this encounter.   Past Medical  History:  Diagnosis Date  . Anxiety   . COPD (chronic obstructive pulmonary disease) ([place])   . Diabetes mellitus without complication ([place])   . Hyperlipidemia   . Hypertension     Past Surgical History:  Procedure Laterality Date  . BREAST EXCISIONAL BIOPSY Left 1976   neg  . COLONOSCOPY  2014   normal  . cyst on bladder removed    . cyst removed breast Left   . PARTIAL HYSTERECTOMY      Family History  Problem Relation Age of Onset  . Diabetes Mother   . Stroke Mother     Social History   Social History  . Marital status: Married    Spouse name: N/A  . Number of children: N/A  . Years of education: N/A   Occupational History  . Not on file.   Social History Main Topics  . Smoking status: Former Research scientist (life sciences)  . Smokeless tobacco: Not on file  . Alcohol use No  . Drug use: No  . Sexual activity: No   Other Topics Concern  . Not on file   Social History Narrative  . No narrative on file    Allergies  Allergen Reactions  . Penicillins   . Sulfa Antibiotics      Review of Systems  Constitutional: Negative for chills, fatigue, fever, irritability, malaise/fatigue and weight loss.  HENT: Negative for congestion, ear discharge, ear pain and sore throat.   Eyes: Negative for blurred vision.  Respiratory: Negative for cough, sputum production, shortness of breath and wheezing.   Cardiovascular: Negative for chest pain, palpitations, orthopnea, leg swelling and PND.  Gastrointestinal: Negative for abdominal pain, blood in stool, constipation, diarrhea, heartburn, melena and nausea.  Genitourinary: Negative for dysuria, frequency, hematuria, impotence and urgency.  Musculoskeletal: Negative for arthralgias, back pain, joint pain, joint swelling, myalgias and neck pain.  Skin: Negative for pallor and rash.   Neurological: Positive for dizziness and vertigo. Negative for tingling, tremors, sensory change, focal weakness, seizures, speech difficulty, weakness and headaches.  Endo/Heme/Allergies: Negative for environmental allergies, polydipsia and polyphagia. Does not bruise/bleed easily.  Psychiatric/Behavioral: Negative for confusion, decreased concentration, depression and suicidal ideas. The patient is not nervous/anxious and does not have insomnia.      Objective  Vitals:   07/14/16 0937  BP: 120/88  Pulse: 76  Weight: 132 lb (59.9 kg)  Height: '5\' 3"'$  (1.6 m)    Physical Exam  Constitutional: She is oriented to person, place, and time and well-developed, well-nourished, and in no distress. No distress.  HENT:  Head: Normocephalic and atraumatic.  Right Ear: Tympanic membrane, external ear and ear canal normal.  Left Ear: Tympanic membrane, external ear and ear canal normal.  Nose: Nose normal.  Mouth/Throat: Uvula is midline and oropharynx is clear and moist. No oropharyngeal exudate, posterior oropharyngeal edema or posterior oropharyngeal erythema.  Eyes: Conjunctivae and EOM are normal. Pupils are equal, round, and reactive to light. Right eye exhibits no discharge. Left eye exhibits no discharge.  Neck: Normal range of motion. Neck supple. No JVD present. No thyromegaly present.  Cardiovascular: Normal rate, regular rhythm, S1 normal, S2 normal, normal heart sounds and intact distal pulses.  Exam reveals no gallop, no S3, no S4 and no friction rub.   No murmur heard. Pulses:      Dorsalis pedis pulses are 2+ on the right side, and 2+ on the left side.       Posterior tibial pulses are 2+ on the right side, and  2+ on the left side.  Pulmonary/Chest: Effort normal and breath sounds normal.  Abdominal: Soft. Bowel sounds are normal. She exhibits no mass. There is no hepatosplenomegaly. There is no tenderness. There is no rebound, no guarding and no CVA tenderness.  Musculoskeletal:  Normal range of motion. She exhibits no edema.  Lymphadenopathy:    She has no cervical adenopathy.  Neurological: She is alert and oriented to person, place, and time. She has normal motor skills, normal sensation, normal strength, normal reflexes and intact cranial nerves. No sensory deficit.  Monofilament normal  Skin: Skin is warm, dry and intact. She is not diaphoretic.  Psychiatric: Mood and affect normal.  Nursing note and vitals reviewed.     Assessment & Plan  Problem List Items Addressed This Visit      Cardiovascular and Mediastinum   BP (high blood pressure) - Primary   Relevant Medications   lisinopril-hydrochlorothiazide (PRINZIDE,ZESTORETIC) 20-25 MG tablet   Other Relevant Orders   Renal Function Panel     Respiratory   Chronic obstructive pulmonary disease (HCC)   Relevant Medications   albuterol (PROVENTIL HFA;VENTOLIN HFA) 108 (90 Base) MCG/ACT inhaler     Endocrine   Diabetes (HCC)   Relevant Medications   lisinopril-hydrochlorothiazide (PRINZIDE,ZESTORETIC) 20-25 MG tablet   metFORMIN (GLUCOPHAGE) 500 MG tablet   glucose blood (ACCU-CHEK AVIVA PLUS) test strip   glucose blood (ACCU-CHEK AVIVA PLUS) test strip   Other Relevant Orders   Hemoglobin A1c   Microalbumin / creatinine urine ratio     Musculoskeletal and Integument   Osteopenia     Other   Familial multiple lipoprotein-type hyperlipidemia   Relevant Medications   lisinopril-hydrochlorothiazide (PRINZIDE,ZESTORETIC) 20-25 MG tablet   Omega 3 1000 MG CAPS   Other Relevant Orders   Lipid Profile    Other Visit Diagnoses    AB (asthmatic bronchitis), mild intermittent, uncomplicated       Relevant Medications   albuterol (PROVENTIL HFA;VENTOLIN HFA) 108 (90 Base) MCG/ACT inhaler   Chronic anxiety       Relevant Medications   FLUoxetine (PROZAC) 10 MG capsule   Flu vaccine need       Relevant Orders   Flu Vaccine QUAD 36+ mos PF IM (Fluarix & Fluzone Quad PF) (Completed)         Dr. Carmine [facility]  07/14/16

## 2016-07-15 LAB — RENAL FUNCTION PANEL
Albumin: 4.8 g/dL (ref 3.5–4.8)
BUN / CREAT RATIO: 25 (ref 12–28)
BUN: 18 mg/dL (ref 8–27)
CO2: 24 mmol/L (ref 18–29)
Calcium: 10.3 mg/dL (ref 8.7–10.3)
Chloride: 94 mmol/L — ABNORMAL LOW (ref 96–106)
Creatinine, Ser: 0.71 mg/dL (ref 0.57–1.00)
GFR calc Af Amer: 98 mL/min/{1.73_m2} (ref >59)
GFR, EST NON AFRICAN AMERICAN: 85 mL/min/{1.73_m2} (ref >59)
GLUCOSE: 126 mg/dL — AB (ref 65–99)
PHOSPHORUS: 2.6 mg/dL (ref 2.5–4.5)
POTASSIUM: 4.6 mmol/L (ref 3.5–5.2)
SODIUM: 139 mmol/L (ref 134–144)

## 2016-07-15 LAB — HEMOGLOBIN A1C
ESTIMATED AVERAGE GLUCOSE: 157 mg/dL
HEMOGLOBIN A1C: 7.1 % — AB (ref 4.8–5.6)

## 2016-07-15 LAB — LIPID PANEL
CHOL/HDL RATIO: 4.7 ratio — AB (ref 0.0–4.4)
Cholesterol, Total: 289 mg/dL — ABNORMAL HIGH (ref 100–199)
HDL: 62 mg/dL (ref >39)
LDL CALC: 198 mg/dL — AB (ref 0–99)
TRIGLYCERIDES: 145 mg/dL (ref 0–149)
VLDL Cholesterol Cal: 29 mg/dL (ref 5–40)

## 2016-07-15 LAB — MICROALBUMIN / CREATININE URINE RATIO
Creatinine, Urine: 87.7 mg/dL
MICROALBUM., U, RANDOM: 4.1 ug/mL
Microalb/Creat Ratio: 4.7 mg/g creat (ref 0.0–30.0)

## 2017-01-22 ENCOUNTER — Other Ambulatory Visit: Payer: Self-pay

## 2017-01-22 DIAGNOSIS — I10 Essential (primary) hypertension: Secondary | ICD-10-CM

## 2017-01-22 DIAGNOSIS — F419 Anxiety disorder, unspecified: Secondary | ICD-10-CM

## 2017-01-22 DIAGNOSIS — E119 Type 2 diabetes mellitus without complications: Secondary | ICD-10-CM

## 2017-01-22 MED ORDER — FLUOXETINE HCL 10 MG PO CAPS: 10.0000 mg | ORAL_CAPSULE | Freq: Every day | ORAL | 0 refills | Status: DC

## 2017-01-22 MED ORDER — METFORMIN HCL 500 MG PO TABS: 500.0000 mg | ORAL_TABLET | Freq: Two times a day (BID) | ORAL | 0 refills | Status: DC

## 2017-01-22 MED ORDER — LISINOPRIL-HYDROCHLOROTHIAZIDE 20-25 MG PO TABS: 1.0000 | ORAL_TABLET | Freq: Every day | ORAL | 0 refills | Status: DC

## 2017-02-09 ENCOUNTER — Encounter: Payer: Self-pay | Admitting: Family Medicine

## 2017-02-09 ENCOUNTER — Ambulatory Visit (INDEPENDENT_AMBULATORY_CARE_PROVIDER_SITE_OTHER): Payer: Medicare Other | Admitting: Family Medicine

## 2017-02-09 VITALS — BP 130/70 | HR 64 | Ht 63.0 in | Wt 133.0 lb

## 2017-02-09 DIAGNOSIS — E7849 Other hyperlipidemia: Secondary | ICD-10-CM

## 2017-02-09 DIAGNOSIS — F419 Anxiety disorder, unspecified: Secondary | ICD-10-CM | POA: Insufficient documentation

## 2017-02-09 DIAGNOSIS — F3341 Major depressive disorder, recurrent, in partial remission: Secondary | ICD-10-CM

## 2017-02-09 DIAGNOSIS — E119 Type 2 diabetes mellitus without complications: Secondary | ICD-10-CM | POA: Diagnosis not present

## 2017-02-09 DIAGNOSIS — E784 Other hyperlipidemia: Secondary | ICD-10-CM | POA: Diagnosis not present

## 2017-02-09 DIAGNOSIS — R413 Other amnesia: Secondary | ICD-10-CM | POA: Diagnosis not present

## 2017-02-09 DIAGNOSIS — I10 Essential (primary) hypertension: Secondary | ICD-10-CM | POA: Diagnosis not present

## 2017-02-09 DIAGNOSIS — J4520 Mild intermittent asthma, uncomplicated: Secondary | ICD-10-CM | POA: Diagnosis not present

## 2017-02-09 MED ORDER — LISINOPRIL-HYDROCHLOROTHIAZIDE 20-25 MG PO TABS: 1.0000 | ORAL_TABLET | Freq: Every day | ORAL | 0 refills | Status: DC

## 2017-02-09 MED ORDER — OMEGA 3 1000 MG PO CAPS: 1.0000 | ORAL_CAPSULE | Freq: Every day | ORAL | 3 refills | Status: DC

## 2017-02-09 MED ORDER — ALBUTEROL SULFATE HFA 108 (90 BASE) MCG/ACT IN AERS: 1.0000 | INHALATION_SPRAY | Freq: Four times a day (QID) | RESPIRATORY_TRACT | 11 refills | Status: DC

## 2017-02-09 MED ORDER — FLUOXETINE HCL 10 MG PO CAPS: 10.0000 mg | ORAL_CAPSULE | Freq: Every day | ORAL | 0 refills | Status: DC

## 2017-02-09 MED ORDER — METFORMIN HCL 500 MG PO TABS: 500.0000 mg | ORAL_TABLET | Freq: Two times a day (BID) | ORAL | 0 refills | Status: DC

## 2017-02-09 NOTE — Progress Notes (Signed)
Name: Jill Guzman   MRN: [id number]    DOB: 1940/11/01   Date:02/09/2017       Progress Note  Subjective  Chief Complaint  Chief Complaint  Patient presents with  . Depression  . Diabetes  . Hypertension  . Memory Loss    "have noticed a change in my memory- where did the thought go"    Depression       The patient presents with depression.  This is a chronic problem.  The current episode started more than 1 year ago.   The onset quality is sudden.   The problem occurs daily.  The problem has been gradually improving since onset.  Associated symptoms include sad.  Associated symptoms include no decreased concentration, no fatigue, no helplessness, no hopelessness, does not have insomnia, not irritable, no restlessness, no decreased interest, no appetite change, no body aches, no myalgias, no headaches, no indigestion and no suicidal ideas.( Grief mode over daughter's death/october 2015-11-27)     The symptoms are aggravated by family issues.  Past treatments include SSRIs - Selective serotonin reuptake inhibitors.  Previous treatment provided no relief relief.  Risk factors include major life event.   Past medical history includes depression.     Pertinent negatives include no anxiety. Diabetes  She presents for her follow-up diabetic visit. She has type 2 diabetes mellitus. Her disease course has been stable. Pertinent negatives for hypoglycemia include no confusion, dizziness, headaches, hunger, mood changes, nervousness/anxiousness, pallor, seizures, sleepiness, speech difficulty, sweats or tremors. Pertinent negatives for diabetes include no blurred vision, no chest pain, no fatigue, no foot paresthesias, no foot ulcerations, no polydipsia, no polyphagia, no polyuria, no visual change, no weakness and no weight loss. There are no hypoglycemic complications. Symptoms are stable. There are no diabetic complications. Pertinent negatives for diabetic complications include no CVA, PVD or retinopathy.  Risk factors for coronary artery disease include hypertension. Current diabetic treatment includes oral agent (monotherapy). She is compliant with treatment all of the time. Her weight is stable. She is following a generally healthy diet. Her home blood glucose trend is fluctuating minimally. Her breakfast blood glucose range is generally 110-130 mg/dl. An ACE inhibitor/angiotensin II receptor blocker is being taken. She does not see a podiatrist.Eye exam is current.  Hypertension  This is a chronic problem. The current episode started more than 1 year ago. The problem has been gradually improving since onset. The problem is controlled. Associated symptoms include malaise/fatigue. Pertinent negatives include no anxiety, blurred vision, chest pain, headaches, neck pain, orthopnea, palpitations, peripheral edema, PND, shortness of breath or sweats. Agents associated with hypertension include NSAIDs. There are no compliance problems.  There is no history of angina, kidney disease, CAD/MI, CVA, heart failure, left ventricular hypertrophy, PVD or retinopathy. There is no history of chronic renal disease, a hypertension causing med or renovascular disease.    No problem-specific Assessment & Plan notes found for this encounter.   Past Medical History:  Diagnosis Date  . Anxiety   . COPD (chronic obstructive pulmonary disease) ([place])   . Diabetes mellitus without complication ([place])   . Hyperlipidemia   . Hypertension     Past Surgical History:  Procedure Laterality Date  . BREAST EXCISIONAL BIOPSY Left 1976   neg  . COLONOSCOPY  11/26/2012   normal  . cyst on bladder removed    . cyst removed breast Left   . PARTIAL HYSTERECTOMY      Family History  Problem Relation Age of Onset  .  Diabetes Mother   . Stroke Mother     Social History   Social History  . Marital status: Married    Spouse name: N/A  . Number of children: N/A  . Years of education: N/A   Occupational History  . Not on file.    Social History Main Topics  . Smoking status: Former Research scientist (life sciences)  . Smokeless tobacco: Not on file  . Alcohol use No  . Drug use: No  . Sexual activity: No   Other Topics Concern  . Not on file   Social History Narrative  . No narrative on file    Allergies  Allergen Reactions  . Penicillins   . Sulfa Antibiotics     Outpatient Medications Prior to Visit  Medication Sig Dispense Refill  . glucose blood (ACCU-CHEK AVIVA PLUS) test strip USE ONE STRIP IN VITRO ONCE DAILY 100 each 6  . albuterol (PROVENTIL HFA;VENTOLIN HFA) 108 (90 Base) MCG/ACT inhaler Inhale 1 puff into the lungs QID. 1 Inhaler 11  . FLUoxetine (PROZAC) 10 MG capsule Take 1 capsule (10 mg total) by mouth daily. 30 capsule 0  . lisinopril-hydrochlorothiazide (PRINZIDE,ZESTORETIC) 20-25 MG tablet Take 1 tablet by mouth daily. 30 tablet 0  . metFORMIN (GLUCOPHAGE) 500 MG tablet Take 1 tablet (500 mg total) by mouth 2 (two) times daily. 30 tablet 0  . Omega 3 1000 MG CAPS Take 1 capsule (1,000 mg total) by mouth daily. 90 each 3  . glucose blood (ACCU-CHEK AVIVA PLUS) test strip USE ONE STRIP TO CHECK GLUCOSE ONCE DAILY 50 each 6   No facility-administered medications prior to visit.     Review of Systems  Constitutional: Positive for malaise/fatigue. Negative for appetite change, chills, fatigue, fever and weight loss.  HENT: Negative for ear discharge, ear pain and sore throat.   Eyes: Negative for blurred vision.  Respiratory: Negative for cough, sputum production, shortness of breath and wheezing.   Cardiovascular: Negative for chest pain, palpitations, orthopnea, leg swelling and PND.  Gastrointestinal: Negative for abdominal pain, blood in stool, constipation, diarrhea, heartburn, melena and nausea.  Genitourinary: Negative for dysuria, frequency, hematuria and urgency.  Musculoskeletal: Negative for back pain, joint pain, myalgias and neck pain.  Skin: Negative for pallor and rash.  Neurological: Negative  for dizziness, tingling, tremors, sensory change, focal weakness, seizures, speech difficulty, weakness and headaches.  Endo/Heme/Allergies: Negative for environmental allergies, polydipsia and polyphagia. Does not bruise/bleed easily.  Psychiatric/Behavioral: Positive for depression. Negative for confusion, decreased concentration and suicidal ideas. The patient is not nervous/anxious and does not have insomnia.      Objective  Vitals:   02/09/17 0829  BP: 130/70  Pulse: 64  Weight: 133 lb (60.3 kg)  Height: 5\' 3"  (1.6 m)    Physical Exam  Constitutional: She is well-developed, well-nourished, and in no distress. She is not irritable. No distress.  HENT:  Head: Normocephalic and atraumatic.  Right Ear: External ear normal.  Left Ear: External ear normal.  Nose: Nose normal.  Mouth/Throat: Oropharynx is clear and moist.  Eyes: Conjunctivae and EOM are normal. Pupils are equal, round, and reactive to light. Right eye exhibits no discharge. Left eye exhibits no discharge.  Neck: Normal range of motion. Neck supple. No JVD present. No thyromegaly present.  Cardiovascular: Normal rate, regular rhythm, normal heart sounds and intact distal pulses.  Exam reveals no gallop and no friction rub.   No murmur heard. Pulmonary/Chest: Effort normal and breath sounds normal. She has no wheezes. She has  no rales.  Abdominal: Soft. Bowel sounds are normal. She exhibits no mass. There is no tenderness. There is no guarding.  Musculoskeletal: Normal range of motion. She exhibits no edema.  Lymphadenopathy:    She has no cervical adenopathy.  Neurological: She is alert. She has normal reflexes.  Skin: Skin is warm and dry. She is not diaphoretic.  Psychiatric: Mood and affect normal.  Nursing note and vitals reviewed.     Assessment & Plan  Problem List Items Addressed This Visit      Cardiovascular and Mediastinum   BP (high blood pressure) - Primary   Relevant Medications    lisinopril-hydrochlorothiazide (PRINZIDE,ZESTORETIC) 20-25 MG tablet   Other Relevant Orders   Renal Function Panel     Respiratory   AB (asthmatic bronchitis), mild intermittent, uncomplicated   Relevant Medications   albuterol (PROVENTIL HFA;VENTOLIN HFA) 108 (90 Base) MCG/ACT inhaler     Endocrine   Diabetes (HCC)   Relevant Medications   lisinopril-hydrochlorothiazide (PRINZIDE,ZESTORETIC) 20-25 MG tablet   metFORMIN (GLUCOPHAGE) 500 MG tablet   Other Relevant Orders   Renal Function Panel   Hemoglobin A1c   Lipid Profile     Other   Familial multiple lipoprotein-type hyperlipidemia   Relevant Medications   lisinopril-hydrochlorothiazide (PRINZIDE,ZESTORETIC) 20-25 MG tablet   Omega 3 1000 MG CAPS   Other Relevant Orders   Lipid Profile   Clinical depression   Relevant Medications   FLUoxetine (PROZAC) 10 MG capsule   Chronic anxiety   Relevant Medications   FLUoxetine (PROZAC) 10 MG capsule    Other Visit Diagnoses    Memory change       Relevant Orders   Ambulatory referral to Neurology      Meds ordered this encounter  Medications  . FLUoxetine (PROZAC) 10 MG capsule    Sig: Take 1 capsule (10 mg total) by mouth daily.    Dispense:  30 capsule    Refill:  0  . lisinopril-hydrochlorothiazide (PRINZIDE,ZESTORETIC) 20-25 MG tablet    Sig: Take 1 tablet by mouth daily.    Dispense:  30 tablet    Refill:  0  . Omega 3 1000 MG CAPS    Sig: Take 1 capsule (1,000 mg total) by mouth daily.    Dispense:  90 each    Refill:  3  . metFORMIN (GLUCOPHAGE) 500 MG tablet    Sig: Take 1 tablet (500 mg total) by mouth 2 (two) times daily.    Dispense:  30 tablet    Refill:  0  . albuterol (PROVENTIL HFA;VENTOLIN HFA) 108 (90 Base) MCG/ACT inhaler    Sig: Inhale 1 puff into the lungs QID.    Dispense:  1 Inhaler    Refill:  11      Dr. Mac[facility]  02/09/17

## 2017-02-10 ENCOUNTER — Other Ambulatory Visit: Payer: Self-pay | Admitting: Family Medicine

## 2017-02-10 DIAGNOSIS — E119 Type 2 diabetes mellitus without complications: Secondary | ICD-10-CM

## 2017-02-10 LAB — RENAL FUNCTION PANEL
Albumin: 4.7 g/dL (ref 3.5–4.8)
BUN / CREAT RATIO: 19 (ref 12–28)
BUN: 13 mg/dL (ref 8–27)
CALCIUM: 10.4 mg/dL — AB (ref 8.7–10.3)
CO2: 26 mmol/L (ref 18–29)
CREATININE: 0.67 mg/dL (ref 0.57–1.00)
Chloride: 100 mmol/L (ref 96–106)
GFR, EST AFRICAN AMERICAN: 100 mL/min/{1.73_m2} (ref >59)
GFR, EST NON AFRICAN AMERICAN: 87 mL/min/{1.73_m2} (ref >59)
Glucose: 149 mg/dL — ABNORMAL HIGH (ref 65–99)
Phosphorus: 2.4 mg/dL — ABNORMAL LOW (ref 2.5–4.5)
Potassium: 4.1 mmol/L (ref 3.5–5.2)
SODIUM: 141 mmol/L (ref 134–144)

## 2017-02-10 LAB — HEMOGLOBIN A1C
Est. average glucose Bld gHb Est-mCnc: 157 mg/dL
Hgb A1c MFr Bld: 7.1 % — ABNORMAL HIGH (ref 4.8–5.6)

## 2017-02-10 LAB — LIPID PANEL
CHOL/HDL RATIO: 4.1 ratio (ref 0.0–4.4)
Cholesterol, Total: 244 mg/dL — ABNORMAL HIGH (ref 100–199)
HDL: 60 mg/dL (ref >39)
LDL Calculated: 156 mg/dL — ABNORMAL HIGH (ref 0–99)
TRIGLYCERIDES: 138 mg/dL (ref 0–149)
VLDL CHOLESTEROL CAL: 28 mg/dL (ref 5–40)

## 2017-02-22 ENCOUNTER — Other Ambulatory Visit: Payer: Self-pay

## 2017-03-25 ENCOUNTER — Other Ambulatory Visit: Payer: Self-pay | Admitting: Family Medicine

## 2017-03-25 DIAGNOSIS — F419 Anxiety disorder, unspecified: Secondary | ICD-10-CM

## 2017-04-09 ENCOUNTER — Other Ambulatory Visit: Payer: Self-pay

## 2017-04-15 DIAGNOSIS — R42 Dizziness and giddiness: Secondary | ICD-10-CM | POA: Diagnosis not present

## 2017-04-15 DIAGNOSIS — R2689 Other abnormalities of gait and mobility: Secondary | ICD-10-CM | POA: Diagnosis not present

## 2017-04-15 DIAGNOSIS — G479 Sleep disorder, unspecified: Secondary | ICD-10-CM | POA: Diagnosis not present

## 2017-04-15 DIAGNOSIS — R413 Other amnesia: Secondary | ICD-10-CM | POA: Diagnosis not present

## 2017-07-02 DIAGNOSIS — M5126 Other intervertebral disc displacement, lumbar region: Secondary | ICD-10-CM | POA: Diagnosis not present

## 2017-07-02 DIAGNOSIS — M545 Low back pain: Secondary | ICD-10-CM | POA: Diagnosis not present

## 2017-07-02 DIAGNOSIS — M6281 Muscle weakness (generalized): Secondary | ICD-10-CM | POA: Diagnosis not present

## 2017-08-05 ENCOUNTER — Other Ambulatory Visit: Payer: Self-pay

## 2017-08-06 ENCOUNTER — Other Ambulatory Visit: Payer: Self-pay | Admitting: Family Medicine

## 2017-08-06 DIAGNOSIS — E119 Type 2 diabetes mellitus without complications: Secondary | ICD-10-CM

## 2017-08-09 ENCOUNTER — Other Ambulatory Visit: Payer: Self-pay

## 2017-08-26 ENCOUNTER — Encounter: Payer: Self-pay | Admitting: Family Medicine

## 2017-08-26 ENCOUNTER — Ambulatory Visit (INDEPENDENT_AMBULATORY_CARE_PROVIDER_SITE_OTHER): Payer: Medicare Other | Admitting: Family Medicine

## 2017-08-26 ENCOUNTER — Ambulatory Visit (INDEPENDENT_AMBULATORY_CARE_PROVIDER_SITE_OTHER): Payer: Medicare Other

## 2017-08-26 VITALS — BP 120/70 | HR 80 | Ht 63.0 in | Wt 134.0 lb

## 2017-08-26 VITALS — BP 120/70 | HR 84 | Temp 98.2°F | Resp 16 | Ht 63.0 in | Wt 134.0 lb

## 2017-08-26 DIAGNOSIS — Z1239 Encounter for other screening for malignant neoplasm of breast: Secondary | ICD-10-CM

## 2017-08-26 DIAGNOSIS — J449 Chronic obstructive pulmonary disease, unspecified: Secondary | ICD-10-CM | POA: Diagnosis not present

## 2017-08-26 DIAGNOSIS — E119 Type 2 diabetes mellitus without complications: Secondary | ICD-10-CM | POA: Diagnosis not present

## 2017-08-26 DIAGNOSIS — Z0000 Encounter for general adult medical examination without abnormal findings: Secondary | ICD-10-CM | POA: Diagnosis not present

## 2017-08-26 DIAGNOSIS — M8580 Other specified disorders of bone density and structure, unspecified site: Secondary | ICD-10-CM | POA: Diagnosis not present

## 2017-08-26 DIAGNOSIS — R05 Cough: Secondary | ICD-10-CM | POA: Diagnosis not present

## 2017-08-26 DIAGNOSIS — E7849 Other hyperlipidemia: Secondary | ICD-10-CM | POA: Diagnosis not present

## 2017-08-26 DIAGNOSIS — I10 Essential (primary) hypertension: Secondary | ICD-10-CM

## 2017-08-26 DIAGNOSIS — R058 Other specified cough: Secondary | ICD-10-CM

## 2017-08-26 DIAGNOSIS — T464X5A Adverse effect of angiotensin-converting-enzyme inhibitors, initial encounter: Secondary | ICD-10-CM

## 2017-08-26 DIAGNOSIS — F3341 Major depressive disorder, recurrent, in partial remission: Secondary | ICD-10-CM

## 2017-08-26 DIAGNOSIS — J4520 Mild intermittent asthma, uncomplicated: Secondary | ICD-10-CM

## 2017-08-26 DIAGNOSIS — Z1231 Encounter for screening mammogram for malignant neoplasm of breast: Secondary | ICD-10-CM

## 2017-08-26 MED ORDER — ALBUTEROL SULFATE HFA 108 (90 BASE) MCG/ACT IN AERS: 1.0000 | INHALATION_SPRAY | Freq: Four times a day (QID) | RESPIRATORY_TRACT | 11 refills | Status: DC

## 2017-08-26 MED ORDER — ESCITALOPRAM OXALATE 10 MG PO TABS: 10.0000 mg | ORAL_TABLET | Freq: Every day | ORAL | 6 refills | Status: DC

## 2017-08-26 MED ORDER — METFORMIN HCL 500 MG PO TABS: 500.0000 mg | ORAL_TABLET | Freq: Two times a day (BID) | ORAL | 6 refills | Status: DC

## 2017-08-26 MED ORDER — LOSARTAN POTASSIUM-HCTZ 100-25 MG PO TABS: 1.0000 | ORAL_TABLET | Freq: Every day | ORAL | 6 refills | Status: DC

## 2017-08-26 MED ORDER — GLUCOSE BLOOD VI STRP: ORAL_STRIP | 11 refills | Status: DC

## 2017-08-26 MED ORDER — OMEGA 3 1000 MG PO CAPS: 1.0000 | ORAL_CAPSULE | Freq: Every day | ORAL | 3 refills | Status: DC

## 2017-08-26 NOTE — Progress Notes (Signed)
Name: Jill Guzman   MRN: [id number]    DOB: 1940/11/05   Date:08/26/2017       Progress Note  Subjective  Chief Complaint  Chief Complaint  Patient presents with  . Depression    came off of prozac- wants to try Lexapro  . Hypertension  . Diabetes    Depression       The patient presents with depression.  This is a chronic problem.  The current episode started more than 1 year ago.   The onset quality is gradual.   The problem occurs intermittently.  Associated symptoms include hopelessness, decreased interest and sad.  Associated symptoms include no decreased concentration, no fatigue, no helplessness, does not have insomnia, not irritable, no restlessness, no appetite change, no body aches, no myalgias, no headaches, no indigestion and no suicidal ideas.     Exacerbated by: holidays.  Past treatments include SSRIs - Selective serotonin reuptake inhibitors (tapered off prozac).  Compliance with treatment is good.  Past compliance problems include medication issues.  Previous treatment provided mild relief.  Risk factors include a change in medication usage/dosage and major life event.   Past medical history includes depression.     Pertinent negatives include no hypothyroidism and no anxiety. Hypertension  This is a chronic problem. The current episode started more than 1 year ago. The problem is unchanged. The problem is controlled. Pertinent negatives include no anxiety, blurred vision, chest pain, headaches, malaise/fatigue, neck pain, orthopnea, palpitations, peripheral edema, PND, shortness of breath or sweats. There are no associated agents to hypertension. There are no known risk factors for coronary artery disease. Past treatments include ACE inhibitors and diuretics. The current treatment provides mild improvement. There are no compliance problems.  There is no history of angina, kidney disease, CAD/MI, CVA, heart failure, left ventricular hypertrophy, PVD or retinopathy. There is no  history of chronic renal disease, a hypertension causing med or renovascular disease.  Diabetes  She presents for her follow-up diabetic visit. She has type 2 diabetes mellitus. Her disease course has been stable. Pertinent negatives for hypoglycemia include no confusion, dizziness, headaches, hunger, mood changes, nervousness/anxiousness, pallor, sleepiness, speech difficulty or sweats. Pertinent negatives for diabetes include no blurred vision, no chest pain, no fatigue, no foot paresthesias, no foot ulcerations, no polydipsia, no polyphagia, no polyuria, no visual change, no weakness and no weight loss. There are no hypoglycemic complications. Symptoms are stable. Pertinent negatives for diabetic complications include no autonomic neuropathy, CVA, heart disease, impotence, nephropathy, peripheral neuropathy, PVD or retinopathy. Risk factors for coronary artery disease include diabetes mellitus, dyslipidemia and hypertension.  Hyperlipidemia  This is a chronic problem. The current episode started more than 1 year ago. The problem is uncontrolled. Recent lipid tests were reviewed and are high. Exacerbating diseases include diabetes. She has no history of chronic renal disease, hypothyroidism, liver disease, obesity or nephrotic syndrome. There are no known factors aggravating her hyperlipidemia. Pertinent negatives include no chest pain, focal weakness, myalgias or shortness of breath. Current antihyperlipidemic treatment includes diet change. There are no compliance problems.  Risk factors for coronary artery disease include hypertension, dyslipidemia and post-menopausal.  Cough  This is a chronic problem. The current episode started more than 1 year ago. The problem has been waxing and waning. The problem occurs every few hours. The cough is non-productive. Pertinent negatives include no chest pain, chills, ear pain, fever, headaches, heartburn, myalgias, rash, sore throat, shortness of breath, sweats,  weight loss or wheezing. Nothing aggravates the  symptoms. She has tried a beta-agonist inhaler for the symptoms. Her past medical history is significant for asthma. There is no history of environmental allergies.  Asthma  There is no cough, shortness of breath, sputum production or wheezing. Pertinent negatives include no appetite change, chest pain, ear pain, fever, headaches, heartburn, malaise/fatigue, myalgias, PND, sore throat, sweats or weight loss. Her past medical history is significant for asthma.    No problem-specific Assessment & Plan notes found for this encounter.   Past Medical History:  Diagnosis Date  . Anxiety   . COPD (chronic obstructive pulmonary disease) ([place])   . Diabetes mellitus without complication ([place])   . Hyperlipidemia   . Hypertension     Past Surgical History:  Procedure Laterality Date  . BREAST EXCISIONAL BIOPSY Left 1976   neg  . COLONOSCOPY  2014   normal  . cyst on bladder removed    . cyst removed breast Left   . PARTIAL HYSTERECTOMY      Family History  Problem Relation Age of Onset  . Diabetes Mother   . Stroke Mother   . Healthy Daughter   . Cancer Son   . Hypertension Son   . Rheum arthritis Daughter   . Hypertension Daughter   . Arthritis Daughter   . COPD Son     Social History   Socioeconomic History  . Marital status: Divorced    Spouse name: Not on file  . Number of children: 5  . Years of education: Not on file  . Highest education level: 12th grade  Social Needs  . Financial resource strain: Not hard at all  . Food insecurity - worry: Never true  . Food insecurity - inability: Never true  . Transportation needs - medical: No  . Transportation needs - non-medical: No  Occupational History  . Occupation: Retired  Tobacco Use  . Smoking status: Former Smoker    Packs/day: 2.00    Years: 10.00    Pack years: 20.00    Types: Cigarettes    Last attempt to quit: 2001    Years since quitting: 17.9  . Smokeless  tobacco: Never Used  . Tobacco comment: Smoking cessation materials not required  Substance and Sexual Activity  . Alcohol use: No    Alcohol/week: 0.0 oz  . Drug use: No  . Sexual activity: No  Other Topics Concern  . Not on file  Social History Narrative  . Not on file    Allergies  Allergen Reactions  . Penicillins   . Sulfa Antibiotics     Outpatient Medications Prior to Visit  Medication Sig Dispense Refill  . ACCU-CHEK AVIVA PLUS test strip USE ONE STRIP TO CHECK GLUCOSE ONCE DAILY 50 each 6  . albuterol (PROVENTIL HFA;VENTOLIN HFA) 108 (90 Base) MCG/ACT inhaler Inhale 1 puff into the lungs QID. 1 Inhaler 11  . FLUoxetine (PROZAC) 10 MG capsule TAKE 1 CAPSULE BY MOUTH ONCE DAILY 30 capsule 1  . lisinopril-hydrochlorothiazide (PRINZIDE,ZESTORETIC) 20-25 MG tablet Take 1 tablet by mouth daily. 30 tablet 0  . metFORMIN (GLUCOPHAGE) 500 MG tablet TAKE 1 TABLET BY MOUTH TWICE DAILY 180 tablet 0  . Omega 3 1000 MG CAPS Take 1 capsule (1,000 mg total) by mouth daily. 90 each 3   No facility-administered medications prior to visit.     Review of Systems  Constitutional: Negative for appetite change, chills, fatigue, fever, malaise/fatigue and weight loss.  HENT: Negative for ear discharge, ear pain and sore throat.  Eyes: Negative for blurred vision.  Respiratory: Negative for cough, sputum production, shortness of breath and wheezing.   Cardiovascular: Negative for chest pain, palpitations, orthopnea, leg swelling and PND.  Gastrointestinal: Negative for abdominal pain, blood in stool, constipation, diarrhea, heartburn, melena and nausea.  Genitourinary: Negative for dysuria, frequency, hematuria, impotence and urgency.  Musculoskeletal: Negative for back pain, joint pain, myalgias and neck pain.  Skin: Negative for pallor and rash.  Neurological: Negative for dizziness, tingling, sensory change, focal weakness, speech difficulty, weakness and headaches.   Endo/Heme/Allergies: Negative for environmental allergies, polydipsia and polyphagia. Does not bruise/bleed easily.  Psychiatric/Behavioral: Positive for depression. Negative for confusion, decreased concentration and suicidal ideas. The patient is not nervous/anxious and does not have insomnia.      Objective  Vitals:   08/26/17 0902  BP: 120/70  Pulse: 80  Weight: 134 lb (60.8 kg)  Height: 5\' 3"  (1.6 m)    Physical Exam  Constitutional: She is well-developed, well-nourished, and in no distress. She is not irritable. No distress.  HENT:  Head: Normocephalic and atraumatic.  Right Ear: External ear normal.  Left Ear: External ear normal.  Nose: Nose normal.  Mouth/Throat: Oropharynx is clear and moist.  Eyes: Conjunctivae and EOM are normal. Pupils are equal, round, and reactive to light. Right eye exhibits no discharge. Left eye exhibits no discharge.  Neck: Normal range of motion. Neck supple. No JVD present. No thyromegaly present.  Cardiovascular: Normal rate, regular rhythm, normal heart sounds and intact distal pulses. Exam reveals no gallop and no friction rub.  No murmur heard. Pulmonary/Chest: Effort normal and breath sounds normal. No respiratory distress. She has no wheezes. She has no rales. She exhibits no tenderness. Right breast exhibits no inverted nipple, no mass, no nipple discharge, no skin change and no tenderness. Left breast exhibits no inverted nipple, no mass, no nipple discharge, no skin change and no tenderness. Breasts are symmetrical.  Abdominal: Soft. Bowel sounds are normal. She exhibits no mass. There is no tenderness. There is no guarding.  Musculoskeletal: Normal range of motion. She exhibits no edema.  Lymphadenopathy:    She has no cervical adenopathy.  Neurological: She is alert. She has normal sensation, normal strength, normal reflexes and intact cranial nerves.  Monofilament normal  Skin: Skin is warm and dry. She is not diaphoretic.   Psychiatric: Mood and affect normal.  Nursing note and vitals reviewed.     Assessment & Plan  Problem List Items Addressed This Visit      Cardiovascular and Mediastinum   BP (high blood pressure)   Relevant Medications   losartan-hydrochlorothiazide (HYZAAR) 100-25 MG tablet   Other Relevant Orders   Renal Function Panel     Respiratory   Chronic obstructive pulmonary disease (HCC)   Relevant Medications   albuterol (PROVENTIL HFA;VENTOLIN HFA) 108 (90 Base) MCG/ACT inhaler   AB (asthmatic bronchitis), mild intermittent, uncomplicated   Relevant Medications   albuterol (PROVENTIL HFA;VENTOLIN HFA) 108 (90 Base) MCG/ACT inhaler     Endocrine   Diabetes ([place]) - Primary   Relevant Medications   metFORMIN (GLUCOPHAGE) 500 MG tablet   glucose blood (ACCU-CHEK AVIVA PLUS) test strip   losartan-hydrochlorothiazide (HYZAAR) 100-25 MG tablet   Other Relevant Orders   Renal Function Panel   Lipid Profile   Hemoglobin A1c     Other   Familial multiple lipoprotein-type hyperlipidemia   Relevant Medications   Omega 3 1000 MG CAPS   losartan-hydrochlorothiazide (HYZAAR) 100-25 MG tablet   Other  Relevant Orders   Lipid Profile   Clinical depression   Relevant Medications   escitalopram (LEXAPRO) 10 MG tablet    Other Visit Diagnoses    Breast cancer screening       Relevant Orders   MM Digital Screening   Cough due to ACE inhibitor       switch losartin      Meds ordered this encounter  Medications  . albuterol (PROVENTIL HFA;VENTOLIN HFA) 108 (90 Base) MCG/ACT inhaler    Sig: Inhale 1 puff into the lungs QID.    Dispense:  1 Inhaler    Refill:  11  . Omega 3 1000 MG CAPS    Sig: Take 1 capsule (1,000 mg total) by mouth daily.    Dispense:  90 each    Refill:  3  . metFORMIN (GLUCOPHAGE) 500 MG tablet    Sig: Take 1 tablet (500 mg total) by mouth 2 (two) times daily.    Dispense:  180 tablet    Refill:  6  . glucose blood (ACCU-CHEK AVIVA PLUS) test strip     Sig: USE ONE STRIP TO CHECK GLUCOSE ONCE DAILY    Dispense:  50 each    Refill:  11    Please consider 90 day supplies to promote better adherence  . losartan-hydrochlorothiazide (HYZAAR) 100-25 MG tablet    Sig: Take 1 tablet by mouth daily.    Dispense:  30 tablet    Refill:  6  . escitalopram (LEXAPRO) 10 MG tablet    Sig: Take 1 tablet (10 mg total) by mouth daily.    Dispense:  30 tablet    Refill:  6      Dr. Otilio Miu [facility]  08/26/17

## 2017-08-26 NOTE — Patient Instructions (Signed)

## 2017-08-26 NOTE — Progress Notes (Signed)
Subjective:   LYNDSI ALTIC is a 76 y.o. female who presents for Medicare Annual (Subsequent) preventive examination.  Review of Systems:  N/A Cardiac Risk Factors include: sedentary lifestyle;family history of premature cardiovascular disease;dyslipidemia;diabetes mellitus;hypertension;advanced age (>54men, >67 women)     Objective:     Vitals: BP 120/70 (BP Location: Left Arm, Patient Position: Sitting, Cuff Size: Normal)   Pulse 84   Temp 98.2 F (36.8 C) (Oral)   Resp 16   Ht 5\' 3"  (1.6 m)   Wt 134 lb (60.8 kg)   BMI 23.74 kg/m   Body mass index is 23.74 kg/m.  Advanced Directives 08/26/2017  Does Patient Have a Medical Advance Directive? Yes  Type of Paramedic of [place];Living will  Copy of [place] in Chart? No - copy requested    Tobacco Social History   Tobacco Use  Smoking Status Former Smoker  . Packs/day: 2.00  . Years: 10.00  . Pack years: 20.00  . Types: Cigarettes  . Last attempt to quit: 2001  . Years since quitting: 17.9  Smokeless Tobacco Never Used  Tobacco Comment   Smoking cessation materials not required     Counseling given: No Comment: Smoking cessation materials not required   Clinical Intake:  Pre-visit preparation completed: Yes  Pain : No/denies pain     BMI - recorded: 23.74 Nutritional Status: BMI of 19-24  Normal Nutritional Risks: None Diabetes: Yes CBG done?: No Did pt. bring in CBG monitor from home?: No  How often do you need to have someone help you when you read instructions, pamphlets, or other written materials from your doctor or pharmacy?: 1 - Never  Interpreter Needed?: No  Information entered by :: AEversole, LPN  Past Medical History:  Diagnosis Date  . Anxiety   . COPD (chronic obstructive pulmonary disease) ([place])   . Diabetes mellitus without complication ([place])   . Hyperlipidemia   . Hypertension    Past Surgical History:  Procedure  Laterality Date  . BREAST EXCISIONAL BIOPSY Left 1976   neg  . COLONOSCOPY  2014   normal  . cyst on bladder removed    . cyst removed breast Left   . PARTIAL HYSTERECTOMY     Family History  Problem Relation Age of Onset  . Diabetes Mother   . Stroke Mother   . Healthy Daughter   . Cancer Son   . Hypertension Son   . Rheum arthritis Daughter   . Hypertension Daughter   . Arthritis Daughter   . COPD Son    Social History   Socioeconomic History  . Marital status: Divorced    Spouse name: None  . Number of children: 5  . Years of education: None  . Highest education level: 12th grade  Social Needs  . Financial resource strain: Not hard at all  . Food insecurity - worry: Never true  . Food insecurity - inability: Never true  . Transportation needs - medical: No  . Transportation needs - non-medical: No  Occupational History  . Occupation: Retired  Tobacco Use  . Smoking status: Former Smoker    Packs/day: 2.00    Years: 10.00    Pack years: 20.00    Types: Cigarettes    Last attempt to quit: 2001    Years since quitting: 17.9  . Smokeless tobacco: Never Used  . Tobacco comment: Smoking cessation materials not required  Substance and Sexual Activity  . Alcohol use: No  Alcohol/week: 0.0 oz  . Drug use: No  . Sexual activity: No  Other Topics Concern  . None  Social History Narrative  . None    Outpatient Encounter Medications as of 08/26/2017  Medication Sig  . albuterol (PROVENTIL HFA;VENTOLIN HFA) 108 (90 Base) MCG/ACT inhaler Inhale 1 puff into the lungs QID.  Marland Kitchen escitalopram (LEXAPRO) 10 MG tablet Take 1 tablet (10 mg total) by mouth daily.  Marland Kitchen glucose blood (ACCU-CHEK AVIVA PLUS) test strip USE ONE STRIP TO CHECK GLUCOSE ONCE DAILY  . losartan-hydrochlorothiazide (HYZAAR) 100-25 MG tablet Take 1 tablet by mouth daily.  . metFORMIN (GLUCOPHAGE) 500 MG tablet Take 1 tablet (500 mg total) by mouth 2 (two) times daily.  . Omega 3 1000 MG CAPS Take 1  capsule (1,000 mg total) by mouth daily.   No facility-administered encounter medications on file as of 08/26/2017.     Activities of Daily Living In your present state of health, do you have any difficulty performing the following activities: 08/26/2017  Hearing? 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 N  Difficulty concentrating or making decisions? Y  Comment short term memory loss  Walking or climbing stairs? Y  Comment exhaustion  Dressing or bathing? N  Doing errands, shopping? N  Preparing Food and eating ? N  Comment full set upper and lower dentures  Using the Toilet? N  In the past six months, have you accidently leaked urine? Y  Comment Stress incontinence. Wears pad  Do you have problems with loss of bowel control? N  Managing your Medications? N  Managing your Finances? N  Housekeeping or managing your Housekeeping? N  Some recent data might be hidden    Patient Care Team: Juline Patch, MD as PCP - General (Family Medicine)    Assessment:   This is a routine wellness examination for Reva.  Exercise Activities and Dietary recommendations Current Exercise Habits: The patient does not participate in regular exercise at present  Goals    . Exercise 150 min/wk Moderate Activity     Recommend to exercise at least 150 minutes per week       Fall Risk Fall Risk  08/26/2017 08/26/2017 02/09/2017 12/02/2015 11/04/2015  Falls in the past year? Yes Yes No No No  Comment lost balance when she got up to go to the bathroom - - - -  Number falls in past yr: 1 1 - - -  Injury with Fall? No No - - -   Is the patient's home free of loose throw rugs in walkways, pet beds, electrical cords, etc?   yes      Grab bars in the bathroom? yes      Handrails on the stairs?   yes. Stairs are only locate on the exterior of the home.      Adequate lighting?   yes   Uses shower chair when showering. Denies use of elevated toilet seat.  Depression Screen PHQ  2/9 Scores 08/26/2017 08/26/2017 08/26/2017 02/09/2017  PHQ - 2 Score 1 1 1 2   PHQ- 9 Score 1 3 - 5     Cognitive Function     6CIT Screen 08/26/2017  What Year? 0 points  What month? 0 points  What time? 0 points  Count back from 20 0 points  Months in reverse 0 points  Repeat phrase 6 points  Total Score 6    Immunization History  Administered Date(s) Administered  . Influenza,inj,Quad PF,6+  Mos 07/14/2016  . Influenza-Unspecified 06/07/2015  . Pneumococcal Conjugate-13 06/17/2015  . Pneumococcal Polysaccharide-23 06/14/2014  . Td 09/07/2006  . Zoster 09/07/2012    Qualifies for Shingles Vaccine? Yes. Education has been provided regarding the importance of this vaccine. Pt has been advised to call her insurance company to determine her out of pocket expense. Advised she may also receive this vaccine at her local pharmacy or Health Dept. Verbalized acceptance and understanding.  Screening Tests Health Maintenance  Topic Date Due  . MAMMOGRAM  06/27/2016  . TETANUS/TDAP  09/07/2016  . OPHTHALMOLOGY EXAM  04/06/2017  . DEXA SCAN  07/02/2017  . HEMOGLOBIN A1C  08/11/2017  . FOOT EXAM  02/09/2018  . COLONOSCOPY  06/22/2026  . INFLUENZA VACCINE  Completed  . PNA vac Low Risk Adult  Completed    Cancer Screenings: Breast:  Up to date on Mammogram? No. Completed 06/17/15. Repeat mammogram every year. Dr. [name] ordered repeat mammogram today. Up to date of Bone Density/Dexa? Yes  Completed 07/03/15. History of osteopenia. Ordered repeat DEXA today. Message sent to referral coordinator for scheduling. Colorectal: Completed 06/22/16. Repeat every 10 years     Plan:   I have personally reviewed and addressed the Medicare Annual Wellness questionnaire and have noted the following in the patient's chart:  A. Medical and social history B. Use of alcohol, tobacco or illicit drugs  C. Current medications and supplements D. Functional ability and status E.  Nutritional  status F.  Physical activity G. Advance directives H. List of other physicians I.  Hospitalizations, surgeries, and ER visits in previous 12 months J.  [place] such as hearing and vision if needed, cognitive and depression L. Referrals and appointments - none  In addition, I have reviewed and discussed with patient certain preventive protocols, quality metrics, and best practice recommendations. A written personalized care plan for preventive services as well as general preventive health recommendations were provided to patient.  Signed,  Aleatha Borer, LPN Nurse Health Advisor  MD Recommendations: Due for repeat DEXA d/t h/o osteopenia. Dexa ordered today.

## 2017-08-26 NOTE — Patient Instructions (Addendum)
[name] , Thank you for taking time to come for your Medicare Wellness Visit. I appreciate your ongoing commitment to your health goals. Please review the following plan we discussed and let me know if I can assist you in the future.   Screening recommendations/referrals: Colonoscopy: Completed Mammogram: Ordered today. Please call [phone] to schedule your mammogram.  Bone Density: Ordered today. Recommended yearly ophthalmology/optometry visit for glaucoma screening and checkup Recommended yearly dental visit for hygiene and checkup  Vaccinations: Influenza vaccine: Completed Pneumococcal vaccine: Completed series Tdap vaccine: Declined. Please call your insurance company to determine your out of pocket expense. You may also receive this vaccine at your local pharmacy or Health Dept. Shingles vaccine: Up to date    Advanced directives: Please bring a copy of your POA (Power of [place]) and/or Living Will to your next appointment.   Conditions/risks identified: Recommend to exercise at least 150 minutes per week  Next appointment: Please schedule your Annual Wellness Visit with your Nurse Health Advisor in one year.  Preventive Care 22 Years and Older, Female Preventive care refers to lifestyle choices and visits with your health care provider that can promote health and wellness. What does preventive care include?  A yearly physical exam. This is also called an annual well check.  Dental exams once or twice a year.  Routine eye exams. Ask your health care provider how often you should have your eyes checked.  Personal lifestyle choices, including:  Daily care of your teeth and gums.  Regular physical activity.  Eating a healthy diet.  Avoiding tobacco and drug use.  Limiting alcohol use.  Practicing safe sex.  Taking low-dose aspirin every day.  Taking vitamin and mineral supplements as recommended by your health care provider. What happens during an  annual well check? The services and screenings done by your health care provider during your annual well check will depend on your age, overall health, lifestyle risk factors, and family history of disease. Counseling  Your health care provider may ask you questions about your:  Alcohol use.  Tobacco use.  Drug use.  Emotional well-being.  Home and relationship well-being.  Sexual activity.  Eating habits.  History of falls.  Memory and ability to understand (cognition).  Work and work Statistician.  Reproductive health. Screening  You may have the following tests or measurements:  Height, weight, and BMI.  Blood pressure.  Lipid and cholesterol levels. These may be checked every 5 years, or more frequently if you are over 46 years old.  Skin check.  Lung cancer screening. You may have this screening every year starting at age 32 if you have a 30-pack-year history of smoking and currently smoke or have quit within the past 15 years.  Fecal occult blood test (FOBT) of the stool. You may have this test every year starting at age 51.  Flexible sigmoidoscopy or colonoscopy. You may have a sigmoidoscopy every 5 years or a colonoscopy every 10 years starting at age 65.  Hepatitis C blood test.  Hepatitis B blood test.  Sexually transmitted disease (STD) testing.  Diabetes screening. This is done by checking your blood sugar (glucose) after you have not eaten for a while (fasting). You may have this done every 1-3 years.  Bone density scan. This is done to screen for osteoporosis. You may have this done starting at age 87.  Mammogram. This may be done every 1-2 years. Talk to your health care provider about how often you should have regular mammograms.  Talk with your health care provider about your test results, treatment options, and if necessary, the need for more tests. Vaccines  Your health care provider may recommend certain vaccines, such as:  Influenza  vaccine. This is recommended every year.  Tetanus, diphtheria, and acellular pertussis (Tdap, Td) vaccine. You may need a Td booster every 10 years.  Zoster vaccine. You may need this after age 58.  Pneumococcal 13-valent conjugate (PCV13) vaccine. One dose is recommended after age 2.  Pneumococcal polysaccharide (PPSV23) vaccine. One dose is recommended after age 97. Talk to your health care provider about which screenings and vaccines you need and how often you need them. This information is not intended to replace advice given to you by your health care provider. Make sure you discuss any questions you have with your health care provider. Document Released: 09/20/2015 Document Revised: 05/13/2016 Document Reviewed: 06/25/2015 Elsevier Interactive Patient Education  2017 [employer] [employer] Prevention in the Home Falls can cause injuries. They can happen to people of all ages. There are many things you can do to make your home safe and to help prevent falls. What can I do on the outside of my home?  Regularly fix the edges of walkways and driveways and fix any cracks.  Remove anything that might make you trip as you walk through a door, such as a raised step or threshold.  Trim any bushes or trees on the path to your home.  Use bright outdoor lighting.  Clear any walking paths of anything that might make someone trip, such as rocks or tools.  Regularly check to see if handrails are loose or broken. Make sure that both sides of any steps have handrails.  Any raised decks and porches should have guardrails on the edges.  Have any leaves, snow, or ice cleared regularly.  Use sand or salt on walking paths during winter.  Clean up any spills in your garage right away. This includes oil or grease spills. What can I do in the bathroom?  Use night lights.  Install grab bars by the toilet and in the tub and shower. Do not use towel bars as grab bars.  Use non-skid mats or decals  in the tub or shower.  If you need to sit down in the shower, use a plastic, non-slip stool.  Keep the floor dry. Clean up any water that spills on the floor as soon as it happens.  Remove soap buildup in the tub or shower regularly.  Attach bath mats securely with double-sided non-slip rug tape.  Do not have throw rugs and other things on the floor that can make you trip. What can I do in the bedroom?  Use night lights.  Make sure that you have a light by your bed that is easy to reach.  Do not use any sheets or blankets that are too big for your bed. They should not hang down onto the floor.  Have a firm chair that has side arms. You can use this for support while you get dressed.  Do not have throw rugs and other things on the floor that can make you trip. What can I do in the kitchen?  Clean up any spills right away.  Avoid walking on wet floors.  Keep items that you use a lot in easy-to-reach places.  If you need to reach something above you, use a strong step stool that has a grab bar.  Keep electrical cords out of the way.  Do not use floor polish or wax that makes floors slippery. If you must use wax, use non-skid floor wax.  Do not have throw rugs and other things on the floor that can make you trip. What can I do with my stairs?  Do not leave any items on the stairs.  Make sure that there are handrails on both sides of the stairs and use them. Fix handrails that are broken or loose. Make sure that handrails are as long as the stairways.  Check any carpeting to make sure that it is firmly attached to the stairs. Fix any carpet that is loose or worn.  Avoid having throw rugs at the top or bottom of the stairs. If you do have throw rugs, attach them to the floor with carpet tape.  Make sure that you have a light switch at the top of the stairs and the bottom of the stairs. If you do not have them, ask someone to add them for you. What else can I do to help  prevent falls?  Wear shoes that:  Do not have high heels.  Have rubber bottoms.  Are comfortable and fit you well.  Are closed at the toe. Do not wear sandals.  If you use a stepladder:  Make sure that it is fully opened. Do not climb a closed stepladder.  Make sure that both sides of the stepladder are locked into place.  Ask someone to hold it for you, if possible.  Clearly mark and make sure that you can see:  Any grab bars or handrails.  First and last steps.  Where the edge of each step is.  Use tools that help you move around (mobility aids) if they are needed. These include:  Canes.  Walkers.  Scooters.  Crutches.  Turn on the lights when you go into a dark area. Replace any light bulbs as soon as they burn out.  Set up your furniture so you have a clear path. Avoid moving your furniture around.  If any of your floors are uneven, fix them.  If there are any pets around you, be aware of where they are.  Review your medicines with your doctor. Some medicines can make you feel dizzy. This can increase your chance of falling. Ask your doctor what other things that you can do to help prevent falls. This information is not intended to replace advice given to you by your health care provider. Make sure you discuss any questions you have with your health care provider. Document Released: 06/20/2009 Document Revised: 01/30/2016 Document Reviewed: 09/28/2014 Elsevier Interactive Patient Education  2017 [employer].

## 2017-08-27 ENCOUNTER — Other Ambulatory Visit: Payer: Self-pay

## 2017-08-27 DIAGNOSIS — E119 Type 2 diabetes mellitus without complications: Secondary | ICD-10-CM

## 2017-08-27 LAB — RENAL FUNCTION PANEL
Albumin: 4.9 g/dL — ABNORMAL HIGH (ref 3.5–4.8)
BUN / CREAT RATIO: 24 (ref 12–28)
BUN: 18 mg/dL (ref 8–27)
CALCIUM: 10.6 mg/dL — AB (ref 8.7–10.3)
CO2: 25 mmol/L (ref 20–29)
Chloride: 99 mmol/L (ref 96–106)
Creatinine, Ser: 0.74 mg/dL (ref 0.57–1.00)
GFR calc Af Amer: 92 mL/min/{1.73_m2} (ref >59)
GFR, EST NON AFRICAN AMERICAN: 80 mL/min/{1.73_m2} (ref >59)
GLUCOSE: 151 mg/dL — AB (ref 65–99)
PHOSPHORUS: 2.9 mg/dL (ref 2.5–4.5)
POTASSIUM: 4.4 mmol/L (ref 3.5–5.2)
SODIUM: 141 mmol/L (ref 134–144)

## 2017-08-27 LAB — HEMOGLOBIN A1C
Est. average glucose Bld gHb Est-mCnc: 157 mg/dL
Hgb A1c MFr Bld: 7.1 % — ABNORMAL HIGH (ref 4.8–5.6)

## 2017-08-27 LAB — LIPID PANEL
CHOL/HDL RATIO: 3.8 ratio (ref 0.0–4.4)
Cholesterol, Total: 260 mg/dL — ABNORMAL HIGH (ref 100–199)
HDL: 68 mg/dL (ref >39)
LDL CALC: 170 mg/dL — AB (ref 0–99)
TRIGLYCERIDES: 111 mg/dL (ref 0–149)
VLDL Cholesterol Cal: 22 mg/dL (ref 5–40)

## 2017-09-22 ENCOUNTER — Ambulatory Visit
Admission: RE | Admit: 2017-09-22 | Discharge: 2017-09-22 | Disposition: A | Discharge status: Home / Self Care | Payer: Medicare Other | Source: Ambulatory Visit | Attending: Family Medicine | Admitting: Family Medicine

## 2017-09-22 DIAGNOSIS — Z780 Asymptomatic menopausal state: Secondary | ICD-10-CM | POA: Insufficient documentation

## 2017-09-22 DIAGNOSIS — Z7984 Long term (current) use of oral hypoglycemic drugs: Secondary | ICD-10-CM | POA: Diagnosis not present

## 2017-09-22 DIAGNOSIS — E119 Type 2 diabetes mellitus without complications: Secondary | ICD-10-CM | POA: Insufficient documentation

## 2017-09-22 DIAGNOSIS — M810 Age-related osteoporosis without current pathological fracture: Secondary | ICD-10-CM | POA: Diagnosis not present

## 2017-09-22 DIAGNOSIS — Z1239 Encounter for other screening for malignant neoplasm of breast: Secondary | ICD-10-CM

## 2017-09-22 DIAGNOSIS — M8580 Other specified disorders of bone density and structure, unspecified site: Secondary | ICD-10-CM | POA: Diagnosis present

## 2017-09-22 DIAGNOSIS — Z1231 Encounter for screening mammogram for malignant neoplasm of breast: Secondary | ICD-10-CM | POA: Insufficient documentation

## 2017-09-22 DIAGNOSIS — Z79899 Other long term (current) drug therapy: Secondary | ICD-10-CM | POA: Insufficient documentation

## 2017-11-01 DIAGNOSIS — M810 Age-related osteoporosis without current pathological fracture: Secondary | ICD-10-CM | POA: Insufficient documentation

## 2017-11-01 DIAGNOSIS — E1169 Type 2 diabetes mellitus with other specified complication: Secondary | ICD-10-CM | POA: Insufficient documentation

## 2017-11-01 DIAGNOSIS — E118 Type 2 diabetes mellitus with unspecified complications: Secondary | ICD-10-CM | POA: Diagnosis not present

## 2017-11-01 DIAGNOSIS — E119 Type 2 diabetes mellitus without complications: Secondary | ICD-10-CM | POA: Insufficient documentation

## 2017-11-24 ENCOUNTER — Other Ambulatory Visit: Payer: Self-pay

## 2017-11-24 DIAGNOSIS — E119 Type 2 diabetes mellitus without complications: Secondary | ICD-10-CM | POA: Diagnosis not present

## 2017-11-24 LAB — HM DIABETES EYE EXAM

## 2017-12-01 ENCOUNTER — Ambulatory Visit (INDEPENDENT_AMBULATORY_CARE_PROVIDER_SITE_OTHER): Payer: Medicare Other | Admitting: Family Medicine

## 2017-12-01 ENCOUNTER — Other Ambulatory Visit: Payer: Self-pay

## 2017-12-01 ENCOUNTER — Encounter: Payer: Self-pay | Admitting: Family Medicine

## 2017-12-01 ENCOUNTER — Other Ambulatory Visit
Admission: RE | Admit: 2017-12-01 | Discharge: 2017-12-01 | Disposition: A | Discharge status: Home / Self Care | Payer: Medicare Other | Source: Ambulatory Visit | Attending: Family Medicine | Admitting: Family Medicine

## 2017-12-01 VITALS — BP 130/82 | HR 72 | Temp 97.9°F | Ht 63.0 in | Wt 136.0 lb

## 2017-12-01 DIAGNOSIS — R42 Dizziness and giddiness: Secondary | ICD-10-CM

## 2017-12-01 DIAGNOSIS — I10 Essential (primary) hypertension: Secondary | ICD-10-CM

## 2017-12-01 DIAGNOSIS — R270 Ataxia, unspecified: Secondary | ICD-10-CM | POA: Diagnosis not present

## 2017-12-01 LAB — PHOSPHORUS: Phosphorus: 2.9 mg/dL (ref 2.5–4.6)

## 2017-12-01 LAB — CALCIUM: Calcium: 10 mg/dL (ref 8.9–10.3)

## 2017-12-01 NOTE — Progress Notes (Signed)
Name: Jill Guzman   MRN: [id number]    DOB: April 23, 1941   Date:12/01/2017       Progress Note  Subjective  Chief Complaint  Chief Complaint  Patient presents with  . Hypertension    was taken off of Losartan- HCTZ d/t calcium being elevated. Since has been on losartan 100mg , but feeling "off- balance" and having numbness in R) arm. also having cough and cong    Hypertension  This is a chronic problem. The current episode started more than 1 year ago. The problem has been gradually worsening since onset. The problem is controlled. Associated symptoms include headaches. Pertinent negatives include no anxiety, blurred vision, chest pain, malaise/fatigue, neck pain, orthopnea, palpitations, peripheral edema, PND, shortness of breath or sweats. There are no associated agents to hypertension. Past treatments include ACE inhibitors and diuretics. The current treatment provides mild improvement. There are no compliance problems.  There is no history of angina, kidney disease, CAD/MI, CVA, heart failure, left ventricular hypertrophy, PVD or retinopathy. There is no history of chronic renal disease, a hypertension causing med or renovascular disease.  Dizziness  This is a recurrent problem. Episode onset: 6-7 months. The problem occurs daily. The problem has been gradually worsening. Associated symptoms include congestion, coughing, headaches and vertigo. Pertinent negatives include no abdominal pain, anorexia, arthralgias, change in bowel habit, chest pain, chills, diaphoresis, fatigue, fever, joint swelling, myalgias, nausea, neck pain, numbness, rash, sore throat, swollen glands, urinary symptoms, visual change, vomiting or weakness. Exacerbated by: turning and moving too fast. The treatment provided mild relief.    No problem-specific Assessment & Plan notes found for this encounter.   Past Medical History:  Diagnosis Date  . Anxiety   . COPD (chronic obstructive pulmonary disease) ([place])   .  Diabetes mellitus without complication ([place])   . Hyperlipidemia   . Hypertension     Past Surgical History:  Procedure Laterality Date  . ABDOMINAL HYSTERECTOMY    . BREAST EXCISIONAL BIOPSY Left 1976   neg  . COLONOSCOPY  2014   normal  . cyst on bladder removed    . cyst removed breast Left   . PARTIAL HYSTERECTOMY      Family History  Problem Relation Age of Onset  . Diabetes Mother   . Stroke Mother   . Healthy Daughter   . Cancer Son   . Hypertension Son   . Rheum arthritis Daughter   . Hypertension Daughter   . Arthritis Daughter   . COPD Son   . Breast cancer Neg Hx     Social History   Socioeconomic History  . Marital status: Divorced    Spouse name: Not on file  . Number of children: 5  . Years of education: Not on file  . Highest education level: 12th grade  Occupational History  . Occupation: Retired  Scientific laboratory technician  . Financial resource strain: Not hard at all  . Food insecurity:    Worry: Never true    Inability: Never true  . Transportation needs:    Medical: No    Non-medical: No  Tobacco Use  . Smoking status: Former Smoker    Packs/day: 2.00    Years: 10.00    Pack years: 20.00    Types: Cigarettes    Last attempt to quit: 2001    Years since quitting: 18.2  . Smokeless tobacco: Never Used  . Tobacco comment: Smoking cessation materials not required  Substance and Sexual Activity  . Alcohol use: No  Alcohol/week: 0.0 oz  . Drug use: No  . Sexual activity: Never  Lifestyle  . Physical activity:    Days per week: 0 days    Minutes per session: 0 min  . Stress: Not at all  Relationships  . Social connections:    Talks on phone: More than three times a week    Gets together: Once a week    Attends religious service: More than 4 times per year    Active member of club or organization: No    Attends meetings of clubs or organizations: Never    Relationship status: Divorced  . Intimate partner violence:    Fear of current or ex  partner: No    Emotionally abused: No    Physically abused: No    Forced sexual activity: No  Other Topics Concern  . Not on file  Social History Narrative  . Not on file    Allergies  Allergen Reactions  . Penicillins   . Sulfa Antibiotics     Outpatient Medications Prior to Visit  Medication Sig Dispense Refill  . albuterol (PROVENTIL HFA;VENTOLIN HFA) 108 (90 Base) MCG/ACT inhaler Inhale 1 puff into the lungs QID. 1 Inhaler 11  . Calcium Carbonate-Vitamin D (CALCIUM HIGH POTENCY/VITAMIN D) 600-200 MG-UNIT TABS Take 1 tablet by mouth daily.    Marland Kitchen escitalopram (LEXAPRO) 10 MG tablet Take 1 tablet (10 mg total) by mouth daily. 30 tablet 6  . glucose blood (ACCU-CHEK AVIVA PLUS) test strip USE ONE STRIP TO CHECK GLUCOSE ONCE DAILY 50 each 11  . losartan (COZAAR) 100 MG tablet Take 100 mg by mouth daily.    . metFORMIN (GLUCOPHAGE) 500 MG tablet Take 1 tablet (500 mg total) by mouth 2 (two) times daily. 180 tablet 6  . Omega 3 1000 MG CAPS Take 1 capsule (1,000 mg total) by mouth daily. (Patient not taking: Reported on 12/01/2017) 90 each 3  . losartan-hydrochlorothiazide (HYZAAR) 100-25 MG tablet Take 1 tablet by mouth daily. 30 tablet 6   No facility-administered medications prior to visit.     Review of Systems  Constitutional: Negative for chills, diaphoresis, fatigue, fever, malaise/fatigue and weight loss.  HENT: Positive for congestion. Negative for ear discharge, ear pain and sore throat.   Eyes: Negative for blurred vision.  Respiratory: Positive for cough. Negative for sputum production, shortness of breath and wheezing.   Cardiovascular: Negative for chest pain, palpitations, orthopnea, leg swelling and PND.  Gastrointestinal: Negative for abdominal pain, anorexia, blood in stool, change in bowel habit, constipation, diarrhea, heartburn, melena, nausea and vomiting.  Genitourinary: Negative for dysuria, frequency, hematuria and urgency.  Musculoskeletal: Negative for  arthralgias, back pain, joint pain, joint swelling, myalgias and neck pain.  Skin: Negative for rash.  Neurological: Positive for dizziness, vertigo and headaches. Negative for tingling, sensory change, focal weakness, weakness and numbness.  Endo/Heme/Allergies: Negative for environmental allergies and polydipsia. Does not bruise/bleed easily.  Psychiatric/Behavioral: Negative for depression and suicidal ideas. The patient is not nervous/anxious and does not have insomnia.      Objective  Vitals:   12/01/17 1148  BP: 130/82  Pulse: 72  Temp: 97.9 F (36.6 C)  TempSrc: Oral  Weight: 136 lb (61.7 kg)  Height: 5\' 3"  (1.6 m)    Physical Exam  Constitutional: She is well-developed, well-nourished, and in no distress. No distress.  HENT:  Head: Normocephalic and atraumatic.  Right Ear: External ear normal.  Left Ear: External ear normal.  Nose: Nose normal.  Mouth/Throat:  Oropharynx is clear and moist.  Eyes: Pupils are equal, round, and reactive to light. Conjunctivae and EOM are normal. Right eye exhibits no discharge. Left eye exhibits no discharge.  Neck: Normal range of motion. Neck supple. No JVD present. No thyromegaly present.  Cardiovascular: Normal rate, regular rhythm, normal heart sounds and intact distal pulses. Exam reveals no gallop and no friction rub.  No murmur heard. Pulmonary/Chest: Effort normal and breath sounds normal. She has no wheezes. She has no rales.  Abdominal: Soft. Bowel sounds are normal. She exhibits no mass. There is no tenderness. There is no guarding.  Musculoskeletal: Normal range of motion. She exhibits no edema.  Lymphadenopathy:    She has no cervical adenopathy.  Neurological: She is alert. She has normal strength, normal reflexes and intact cranial nerves. A sensory deficit is present. No cranial nerve deficit.  Decreased sensory right arm  Skin: Skin is warm and dry. She is not diaphoretic.  Psychiatric: Mood and affect normal.  Nursing  note and vitals reviewed.     Assessment & Plan  Problem List Items Addressed This Visit      Cardiovascular and Mediastinum   BP (high blood pressure) - Primary   Relevant Medications   losartan (COZAAR) 100 MG tablet    Other Visit Diagnoses    Hypercalcemia       calcium/phosphorus   Dizziness       Relevant Orders   Ambulatory referral to Neurology   Ambulatory referral to ENT   Ataxia       Relevant Orders   Ambulatory referral to Neurology   Vertigo       Relevant Orders   Ambulatory referral to ENT      No orders of the defined types were placed in this encounter.     Dr. [facility]  12/01/17

## 2017-12-03 ENCOUNTER — Other Ambulatory Visit: Payer: Self-pay | Admitting: Otolaryngology

## 2017-12-03 DIAGNOSIS — H8112 Benign paroxysmal vertigo, left ear: Secondary | ICD-10-CM | POA: Diagnosis not present

## 2017-12-03 DIAGNOSIS — H903 Sensorineural hearing loss, bilateral: Secondary | ICD-10-CM

## 2017-12-10 ENCOUNTER — Ambulatory Visit
Admission: RE | Admit: 2017-12-10 | Discharge: 2017-12-10 | Disposition: A | Discharge status: Home / Self Care | Payer: Medicare Other | Source: Ambulatory Visit | Attending: Otolaryngology | Admitting: Otolaryngology

## 2017-12-10 DIAGNOSIS — J323 Chronic sphenoidal sinusitis: Secondary | ICD-10-CM | POA: Insufficient documentation

## 2017-12-10 DIAGNOSIS — R51 Headache: Secondary | ICD-10-CM | POA: Insufficient documentation

## 2017-12-10 DIAGNOSIS — H903 Sensorineural hearing loss, bilateral: Secondary | ICD-10-CM | POA: Diagnosis not present

## 2017-12-10 MED ORDER — GADOBENATE DIMEGLUMINE 529 MG/ML IV SOLN
12.0000 mL | Freq: Once | INTRAVENOUS | Status: AC | PRN
  Administered 2017-12-10: 12 mL via INTRAVENOUS

## 2017-12-21 DIAGNOSIS — H8112 Benign paroxysmal vertigo, left ear: Secondary | ICD-10-CM | POA: Diagnosis not present

## 2017-12-21 DIAGNOSIS — R42 Dizziness and giddiness: Secondary | ICD-10-CM | POA: Diagnosis not present

## 2017-12-22 ENCOUNTER — Ambulatory Visit: Payer: Medicare Other

## 2017-12-23 DIAGNOSIS — R2689 Other abnormalities of gait and mobility: Secondary | ICD-10-CM | POA: Diagnosis not present

## 2017-12-23 DIAGNOSIS — R42 Dizziness and giddiness: Secondary | ICD-10-CM | POA: Diagnosis not present

## 2017-12-23 DIAGNOSIS — G479 Sleep disorder, unspecified: Secondary | ICD-10-CM | POA: Diagnosis not present

## 2017-12-23 DIAGNOSIS — R413 Other amnesia: Secondary | ICD-10-CM | POA: Diagnosis not present

## 2017-12-30 DIAGNOSIS — R42 Dizziness and giddiness: Secondary | ICD-10-CM | POA: Diagnosis not present

## 2017-12-30 DIAGNOSIS — H8112 Benign paroxysmal vertigo, left ear: Secondary | ICD-10-CM | POA: Diagnosis not present

## 2018-01-05 DIAGNOSIS — R200 Anesthesia of skin: Secondary | ICD-10-CM | POA: Diagnosis not present

## 2018-01-05 DIAGNOSIS — R202 Paresthesia of skin: Secondary | ICD-10-CM | POA: Diagnosis not present

## 2018-01-05 DIAGNOSIS — M79641 Pain in right hand: Secondary | ICD-10-CM | POA: Diagnosis not present

## 2018-01-12 ENCOUNTER — Encounter: Payer: Self-pay | Admitting: Family Medicine

## 2018-01-12 ENCOUNTER — Ambulatory Visit (INDEPENDENT_AMBULATORY_CARE_PROVIDER_SITE_OTHER): Payer: Medicare Other | Admitting: Family Medicine

## 2018-01-12 VITALS — BP 138/98 | HR 72 | Ht 63.0 in | Wt 134.0 lb

## 2018-01-12 DIAGNOSIS — I10 Essential (primary) hypertension: Secondary | ICD-10-CM | POA: Diagnosis not present

## 2018-01-12 MED ORDER — AMLODIPINE BESYLATE 2.5 MG PO TABS: 2.5000 mg | ORAL_TABLET | Freq: Every day | ORAL | 1 refills | Status: DC

## 2018-01-12 MED ORDER — LOSARTAN POTASSIUM 100 MG PO TABS: 100.0000 mg | ORAL_TABLET | Freq: Every day | ORAL | 1 refills | Status: DC

## 2018-01-12 NOTE — Progress Notes (Signed)
Name: Jill Guzman   MRN: [id number]    DOB: 1941-06-15   Date:01/12/2018       Progress Note  Subjective  Chief Complaint  Chief Complaint  Patient presents with  . Hypertension    Hypertension  This is a chronic problem. The current episode started more than 1 year ago. The problem is unchanged. The problem is controlled. Associated symptoms include neck pain. Pertinent negatives include no anxiety, blurred vision, chest pain, headaches, malaise/fatigue, orthopnea, palpitations, peripheral edema, PND, shortness of breath or sweats. There are no associated agents to hypertension. Past treatments include angiotensin blockers. The current treatment provides moderate improvement. There are no compliance problems.  There is no history of angina, kidney disease, CAD/MI, CVA, heart failure, left ventricular hypertrophy, PVD or retinopathy. There is no history of chronic renal disease, a hypertension causing med or renovascular disease.    No problem-specific Assessment & Plan notes found for this encounter.   Past Medical History:  Diagnosis Date  . Anxiety   . COPD (chronic obstructive pulmonary disease) ([place])   . Diabetes mellitus without complication ([place])   . Hyperlipidemia   . Hypertension     Past Surgical History:  Procedure Laterality Date  . ABDOMINAL HYSTERECTOMY    . BREAST EXCISIONAL BIOPSY Left 1976   neg  . COLONOSCOPY  2014   normal  . cyst on bladder removed    . cyst removed breast Left   . PARTIAL HYSTERECTOMY      Family History  Problem Relation Age of Onset  . Diabetes Mother   . Stroke Mother   . Healthy Daughter   . Cancer Son   . Hypertension Son   . Rheum arthritis Daughter   . Hypertension Daughter   . Arthritis Daughter   . COPD Son   . Breast cancer Neg Hx     Social History   Socioeconomic History  . Marital status: Divorced    Spouse name: Not on file  . Number of children: 5  . Years of education: Not on file  . Highest education  level: 12th grade  Occupational History  . Occupation: Retired  Scientific laboratory technician  . Financial resource strain: Not hard at all  . Food insecurity:    Worry: Never true    Inability: Never true  . Transportation needs:    Medical: No    Non-medical: No  Tobacco Use  . Smoking status: Former Smoker    Packs/day: 2.00    Years: 10.00    Pack years: 20.00    Types: Cigarettes    Last attempt to quit: 2001    Years since quitting: 18.3  . Smokeless tobacco: Never Used  . Tobacco comment: Smoking cessation materials not required  Substance and Sexual Activity  . Alcohol use: No    Alcohol/week: 0.0 oz  . Drug use: No  . Sexual activity: Never  Lifestyle  . Physical activity:    Days per week: 0 days    Minutes per session: 0 min  . Stress: Not at all  Relationships  . Social connections:    Talks on phone: More than three times a week    Gets together: Once a week    Attends religious service: More than 4 times per year    Active member of club or organization: No    Attends meetings of clubs or organizations: Never    Relationship status: Divorced  . Intimate partner violence:    Fear of current  or ex partner: No    Emotionally abused: No    Physically abused: No    Forced sexual activity: No  Other Topics Concern  . Not on file  Social History Narrative  . Not on file    Allergies  Allergen Reactions  . Penicillins   . Sulfa Antibiotics     Outpatient Medications Prior to Visit  Medication Sig Dispense Refill  . albuterol (PROVENTIL HFA;VENTOLIN HFA) 108 (90 Base) MCG/ACT inhaler Inhale 1 puff into the lungs QID. 1 Inhaler 11  . Calcium Carbonate-Vitamin D (CALCIUM HIGH POTENCY/VITAMIN D) 600-200 MG-UNIT TABS Take 1 tablet by mouth daily.    Marland Kitchen escitalopram (LEXAPRO) 10 MG tablet Take 1 tablet (10 mg total) by mouth daily. 30 tablet 6  . glucose blood (ACCU-CHEK AVIVA PLUS) test strip USE ONE STRIP TO CHECK GLUCOSE ONCE DAILY 50 each 11  . metFORMIN (GLUCOPHAGE)  500 MG tablet Take 1 tablet (500 mg total) by mouth 2 (two) times daily. 180 tablet 6  . Omega 3 1000 MG CAPS Take 1 capsule (1,000 mg total) by mouth daily. 90 each 3  . losartan (COZAAR) 100 MG tablet Take 100 mg by mouth daily.     No facility-administered medications prior to visit.     Review of Systems  Constitutional: Negative for chills, fever, malaise/fatigue and weight loss.  HENT: Negative for ear discharge, ear pain and sore throat.   Eyes: Negative for blurred vision.  Respiratory: Negative for cough, sputum production, shortness of breath and wheezing.   Cardiovascular: Negative for chest pain, palpitations, orthopnea, leg swelling and PND.  Gastrointestinal: Negative for abdominal pain, blood in stool, constipation, diarrhea, heartburn, melena and nausea.  Genitourinary: Negative for dysuria, frequency, hematuria and urgency.  Musculoskeletal: Positive for neck pain. Negative for back pain, joint pain and myalgias.  Skin: Negative for rash.  Neurological: Negative for dizziness, tingling, sensory change, focal weakness and headaches.  Endo/Heme/Allergies: Negative for environmental allergies and polydipsia. Does not bruise/bleed easily.  Psychiatric/Behavioral: Negative for depression and suicidal ideas. The patient is not nervous/anxious and does not have insomnia.      Objective  Vitals:   01/12/18 1100  BP: (!) 138/98  Pulse: 72  Weight: 134 lb (60.8 kg)  Height: 5\' 3"  (1.6 m)    Physical Exam  Constitutional: She is oriented to person, place, and time. She appears well-developed and well-nourished.  HENT:  Head: Normocephalic.  Right Ear: External ear normal.  Left Ear: External ear normal.  Mouth/Throat: Oropharynx is clear and moist.  Eyes: Pupils are equal, round, and reactive to light. Conjunctivae and EOM are normal. Lids are everted and swept, no foreign bodies found. Left eye exhibits no hordeolum. No foreign body present in the left eye. Right  conjunctiva is not injected. Left conjunctiva is not injected. No scleral icterus.  Neck: Normal range of motion. Neck supple. No JVD present. No tracheal deviation present. No thyromegaly present.  Cardiovascular: Normal rate, regular rhythm, normal heart sounds and intact distal pulses. Exam reveals no gallop and no friction rub.  No murmur heard. Pulmonary/Chest: Effort normal and breath sounds normal. No respiratory distress. She has no wheezes. She has no rales.  Abdominal: Soft. Bowel sounds are normal. She exhibits no mass. There is no hepatosplenomegaly. There is no tenderness. There is no rebound and no guarding.  Musculoskeletal: Normal range of motion. She exhibits no edema or tenderness.  Lymphadenopathy:    She has no cervical adenopathy.  Neurological: She is alert  and oriented to person, place, and time. She has normal strength. She displays normal reflexes. No cranial nerve deficit.  Skin: Skin is warm. No rash noted.  Psychiatric: She has a normal mood and affect. Her mood appears not anxious. She does not exhibit a depressed mood.  Nursing note and vitals reviewed.     Assessment & Plan  Problem List Items Addressed This Visit      Cardiovascular and Mediastinum   BP (high blood pressure) - Primary   Relevant Medications   losartan (COZAAR) 100 MG tablet   amLODipine (NORVASC) 2.5 MG tablet      Meds ordered this encounter  Medications  . losartan (COZAAR) 100 MG tablet    Sig: Take 1 tablet (100 mg total) by mouth daily.    Dispense:  90 tablet    Refill:  1  . amLODipine (NORVASC) 2.5 MG tablet    Sig: Take 1 tablet (2.5 mg total) by mouth daily.    Dispense:  30 tablet    Refill:  1      Dr. [facility]  01/12/18

## 2018-01-28 DIAGNOSIS — M810 Age-related osteoporosis without current pathological fracture: Secondary | ICD-10-CM | POA: Diagnosis not present

## 2018-01-28 DIAGNOSIS — E119 Type 2 diabetes mellitus without complications: Secondary | ICD-10-CM | POA: Diagnosis not present

## 2018-02-04 DIAGNOSIS — M810 Age-related osteoporosis without current pathological fracture: Secondary | ICD-10-CM | POA: Diagnosis not present

## 2018-02-04 DIAGNOSIS — E1169 Type 2 diabetes mellitus with other specified complication: Secondary | ICD-10-CM | POA: Diagnosis not present

## 2018-02-04 DIAGNOSIS — E785 Hyperlipidemia, unspecified: Secondary | ICD-10-CM | POA: Diagnosis not present

## 2018-02-04 DIAGNOSIS — E119 Type 2 diabetes mellitus without complications: Secondary | ICD-10-CM | POA: Diagnosis not present

## 2018-02-04 LAB — HEMOGLOBIN A1C: Hemoglobin A1C: 7.1

## 2018-02-14 ENCOUNTER — Ambulatory Visit (INDEPENDENT_AMBULATORY_CARE_PROVIDER_SITE_OTHER): Payer: Medicare Other | Admitting: Family Medicine

## 2018-02-14 ENCOUNTER — Encounter: Payer: Self-pay | Admitting: Family Medicine

## 2018-02-14 VITALS — BP 120/80 | HR 80 | Ht 63.0 in | Wt 136.0 lb

## 2018-02-14 DIAGNOSIS — E7849 Other hyperlipidemia: Secondary | ICD-10-CM | POA: Diagnosis not present

## 2018-02-14 DIAGNOSIS — M810 Age-related osteoporosis without current pathological fracture: Secondary | ICD-10-CM | POA: Diagnosis not present

## 2018-02-14 DIAGNOSIS — F3341 Major depressive disorder, recurrent, in partial remission: Secondary | ICD-10-CM | POA: Diagnosis not present

## 2018-02-14 DIAGNOSIS — I10 Essential (primary) hypertension: Secondary | ICD-10-CM

## 2018-02-14 DIAGNOSIS — J4520 Mild intermittent asthma, uncomplicated: Secondary | ICD-10-CM

## 2018-02-14 DIAGNOSIS — J0140 Acute pansinusitis, unspecified: Secondary | ICD-10-CM | POA: Diagnosis not present

## 2018-02-14 DIAGNOSIS — E119 Type 2 diabetes mellitus without complications: Secondary | ICD-10-CM | POA: Diagnosis not present

## 2018-02-14 MED ORDER — GLUCOSE BLOOD VI STRP: ORAL_STRIP | 11 refills | Status: DC

## 2018-02-14 MED ORDER — LOSARTAN POTASSIUM 100 MG PO TABS: 100.0000 mg | ORAL_TABLET | Freq: Every day | ORAL | 1 refills | Status: DC

## 2018-02-14 MED ORDER — AMLODIPINE BESYLATE 2.5 MG PO TABS: 2.5000 mg | ORAL_TABLET | Freq: Every day | ORAL | 1 refills | Status: DC

## 2018-02-14 MED ORDER — AZITHROMYCIN 250 MG PO TABS: ORAL_TABLET | ORAL | 0 refills | Status: DC

## 2018-02-14 MED ORDER — OMEGA 3 1000 MG PO CAPS: 1.0000 | ORAL_CAPSULE | Freq: Every day | ORAL | 3 refills | Status: DC

## 2018-02-14 MED ORDER — ESCITALOPRAM OXALATE 10 MG PO TABS: 10.0000 mg | ORAL_TABLET | Freq: Every day | ORAL | 6 refills | Status: DC

## 2018-02-14 MED ORDER — METFORMIN HCL 500 MG PO TABS: 500.0000 mg | ORAL_TABLET | Freq: Two times a day (BID) | ORAL | 6 refills | Status: AC

## 2018-02-14 MED ORDER — ALBUTEROL SULFATE HFA 108 (90 BASE) MCG/ACT IN AERS: 1.0000 | INHALATION_SPRAY | Freq: Four times a day (QID) | RESPIRATORY_TRACT | 11 refills | Status: DC

## 2018-02-14 NOTE — Assessment & Plan Note (Signed)
Controlled on escitalopram 10 mg daily.

## 2018-02-14 NOTE — Progress Notes (Signed)
Name: Jill Guzman   MRN: [id number]    DOB: 1941-07-01   Date:02/14/2018       Progress Note  Subjective  Chief Complaint  Chief Complaint  Patient presents with  . Hypertension    1 month follow up after adding Amlodipine  . Sore Throat    cough and burning throat    Hypertension  This is a chronic problem. The current episode started more than 1 year ago. The problem has been gradually improving (started amlodipine) since onset. The problem is controlled. Associated symptoms include headaches and neck pain. Pertinent negatives include no anxiety, blurred vision, chest pain, malaise/fatigue, orthopnea, palpitations, peripheral edema, PND, shortness of breath or sweats. There are no associated agents to hypertension. Risk factors for coronary artery disease include dyslipidemia. Past treatments include angiotensin blockers and calcium channel blockers. The current treatment provides moderate improvement. There are no compliance problems.  There is no history of angina, kidney disease, CAD/MI, CVA, heart failure, left ventricular hypertrophy, PVD or retinopathy. There is no history of chronic renal disease, a hypertension causing med or renovascular disease.  Sinusitis  This is a new problem. The current episode started in the past 7 days (Friday). The problem has been gradually worsening since onset. There has been no fever. Associated symptoms include congestion, coughing, headaches, a hoarse voice, neck pain, sinus pressure and a sore throat. Pertinent negatives include no chills, diaphoresis, ear pain, shortness of breath, sneezing or swollen glands. (Post nasal drainage/Frontal) Past treatments include acetaminophen. The treatment provided mild relief.  Hyperlipidemia  This is a chronic problem. The current episode started more than 1 year ago. The problem is controlled. Recent lipid tests were reviewed and are normal. She has no history of chronic renal disease, diabetes, hypothyroidism,  liver disease, obesity or nephrotic syndrome. There are no known factors aggravating her hyperlipidemia. Pertinent negatives include no chest pain, focal sensory loss, focal weakness, leg pain, myalgias or shortness of breath. Treatments tried: omega 3. The current treatment provides moderate improvement of lipids. There are no compliance problems.  There are no known risk factors for coronary artery disease.  Asthma  She complains of cough, frequent throat clearing and hoarse voice. There is no chest tightness, difficulty breathing, hemoptysis, shortness of breath, sputum production or wheezing. This is a recurrent problem. The current episode started more than 1 year ago. The problem occurs intermittently. The problem has been waxing and waning. The cough is non-productive. Associated symptoms include headaches, nasal congestion, postnasal drip and a sore throat. Pertinent negatives include no appetite change, chest pain, dyspnea on exertion, ear pain, fever, heartburn, malaise/fatigue, myalgias, PND, rhinorrhea, sneezing, sweats or weight loss. Her symptoms are aggravated by pollen and change in weather. She reports moderate improvement on treatment. Her symptoms are not alleviated by beta-agonist. Her past medical history is significant for asthma.  Depression         This is a new problem.  The current episode started more than 1 year ago. The problem is unchanged.  Associated symptoms include fatigue and headaches.  Associated symptoms include no decreased concentration, no helplessness, no hopelessness, does not have insomnia, not irritable, no restlessness, no decreased interest, no appetite change, no body aches, no myalgias, no indigestion, not sad and no suicidal ideas.     The symptoms are aggravated by nothing.  Past treatments include SSRIs - Selective serotonin reuptake inhibitors.  Compliance with treatment is good.  Previous treatment provided moderate relief.   Pertinent negatives include no  hypothyroidism and no anxiety.   Recurrent major depressive disorder, in partial remission (HCC) Controlled on escitalopram 10 mg daily.  Diabetes Controlled on metformen 500mg  bid and will check A1C and renal function.  BP (high blood pressure) Controlled on losartin 100mg  and amlodipine 2.5 mg and will check renal panel.  AB (asthmatic bronchitis) Controlled on Proventil inhaler prn basis.  Familial multiple lipoprotein-type hyperlipidemia Controlled by diet and OTC omega 3.   Past Medical History:  Diagnosis Date  . Anxiety   . COPD (chronic obstructive pulmonary disease) ([place])   . Diabetes mellitus without complication ([place])   . Hyperlipidemia   . Hypertension     Past Surgical History:  Procedure Laterality Date  . ABDOMINAL HYSTERECTOMY    . BREAST EXCISIONAL BIOPSY Left 1976   neg  . COLONOSCOPY  2014   normal  . cyst on bladder removed    . cyst removed breast Left   . PARTIAL HYSTERECTOMY      Family History  Problem Relation Age of Onset  . Diabetes Mother   . Stroke Mother   . Healthy Daughter   . Cancer Son   . Hypertension Son   . Rheum arthritis Daughter   . Hypertension Daughter   . Arthritis Daughter   . COPD Son   . Breast cancer Neg Hx     Social History   Socioeconomic History  . Marital status: Divorced    Spouse name: Not on file  . Number of children: 5  . Years of education: Not on file  . Highest education level: 12th grade  Occupational History  . Occupation: Retired  Scientific laboratory technician  . Financial resource strain: Not hard at all  . Food insecurity:    Worry: Never true    Inability: Never true  . Transportation needs:    Medical: No    Non-medical: No  Tobacco Use  . Smoking status: Former Smoker    Packs/day: 2.00    Years: 10.00    Pack years: 20.00    Types: Cigarettes    Last attempt to quit: 2001    Years since quitting: 18.4  . Smokeless tobacco: Never Used  . Tobacco comment: Smoking cessation materials not  required  Substance and Sexual Activity  . Alcohol use: No    Alcohol/week: 0.0 oz  . Drug use: No  . Sexual activity: Never  Lifestyle  . Physical activity:    Days per week: 0 days    Minutes per session: 0 min  . Stress: Not at all  Relationships  . Social connections:    Talks on phone: More than three times a week    Gets together: Once a week    Attends religious service: More than 4 times per year    Active member of club or organization: No    Attends meetings of clubs or organizations: Never    Relationship status: Divorced  . Intimate partner violence:    Fear of current or ex partner: No    Emotionally abused: No    Physically abused: No    Forced sexual activity: No  Other Topics Concern  . Not on file  Social History Narrative  . Not on file    Allergies  Allergen Reactions  . Penicillins   . Sulfa Antibiotics     Outpatient Medications Prior to Visit  Medication Sig Dispense Refill  . alendronate (FOSAMAX) 70 MG tablet Take 1 tablet by mouth once a week.    Marland Kitchen  Calcium Carbonate-Vitamin D (CALCIUM HIGH POTENCY/VITAMIN D) 600-200 MG-UNIT TABS Take 1 tablet by mouth daily.    Marland Kitchen albuterol (PROVENTIL HFA;VENTOLIN HFA) 108 (90 Base) MCG/ACT inhaler Inhale 1 puff into the lungs QID. 1 Inhaler 11  . amLODipine (NORVASC) 2.5 MG tablet Take 1 tablet (2.5 mg total) by mouth daily. 30 tablet 1  . escitalopram (LEXAPRO) 10 MG tablet Take 1 tablet (10 mg total) by mouth daily. 30 tablet 6  . glucose blood (ACCU-CHEK AVIVA PLUS) test strip USE ONE STRIP TO CHECK GLUCOSE ONCE DAILY 50 each 11  . losartan (COZAAR) 100 MG tablet Take 1 tablet (100 mg total) by mouth daily. 90 tablet 1  . metFORMIN (GLUCOPHAGE) 500 MG tablet Take 1 tablet (500 mg total) by mouth 2 (two) times daily. 180 tablet 6  . Omega 3 1000 MG CAPS Take 1 capsule (1,000 mg total) by mouth daily. 90 each 3   No facility-administered medications prior to visit.     Review of Systems  Constitutional:  Positive for fatigue. Negative for appetite change, chills, diaphoresis, fever, malaise/fatigue and weight loss.  HENT: Positive for congestion, hoarse voice, postnasal drip, sinus pressure and sore throat. Negative for ear discharge, ear pain, rhinorrhea and sneezing.   Eyes: Negative for blurred vision.  Respiratory: Positive for cough. Negative for hemoptysis, sputum production, shortness of breath and wheezing.   Cardiovascular: Negative for chest pain, dyspnea on exertion, palpitations, orthopnea, leg swelling and PND.  Gastrointestinal: Negative for abdominal pain, blood in stool, constipation, diarrhea, heartburn, melena and nausea.  Genitourinary: Negative for dysuria, frequency, hematuria and urgency.  Musculoskeletal: Positive for neck pain. Negative for back pain, joint pain and myalgias.  Skin: Negative for rash.  Neurological: Positive for headaches. Negative for dizziness, tingling, sensory change and focal weakness.  Endo/Heme/Allergies: Negative for environmental allergies and polydipsia. Does not bruise/bleed easily.  Psychiatric/Behavioral: Negative for decreased concentration, depression and suicidal ideas. The patient is not nervous/anxious and does not have insomnia.      Objective  Vitals:   02/14/18 0824  BP: 120/80  Pulse: 80  Weight: 136 lb (61.7 kg)  Height: 5\' 3"  (1.6 m)    Physical Exam  Constitutional: She is not irritable. No distress.  HENT:  Head: Normocephalic and atraumatic.  Right Ear: Hearing, external ear and ear canal normal. Tympanic membrane is retracted.  Left Ear: Hearing, external ear and ear canal normal. Tympanic membrane is retracted.  Nose: Mucosal edema present. Right sinus exhibits maxillary sinus tenderness and frontal sinus tenderness. Left sinus exhibits maxillary sinus tenderness and frontal sinus tenderness.  Mouth/Throat: Posterior oropharyngeal erythema present. No oropharyngeal exudate or posterior oropharyngeal edema.  Food  particle  Eyes: Pupils are equal, round, and reactive to light. Conjunctivae and EOM are normal. Right eye exhibits no discharge. Left eye exhibits no discharge.  Neck: Normal range of motion. Neck supple. No JVD present. No thyromegaly present.  Cardiovascular: Normal rate, regular rhythm, normal heart sounds and intact distal pulses. Exam reveals no gallop and no friction rub.  No murmur heard. Pulmonary/Chest: Effort normal and breath sounds normal.  Abdominal: Soft. Bowel sounds are normal. She exhibits no mass. There is no tenderness. There is no guarding.  Musculoskeletal: Normal range of motion. She exhibits no edema.  Feet:  Right Foot:  Protective Sensation: 10 sites tested. 10 sites sensed.  Skin Integrity: Positive for warmth and dry skin. Negative for ulcer, blister, skin breakdown, erythema or callus.  Left Foot:  Protective Sensation: 10 sites tested.  10 sites sensed.  Skin Integrity: Positive for warmth and dry skin. Negative for ulcer, blister, skin breakdown, erythema or callus.  Lymphadenopathy:       Head (right side): No submandibular and no tonsillar adenopathy present.       Head (left side): No submandibular and no tonsillar adenopathy present.    She has no cervical adenopathy.  Neurological: She is alert. She has normal reflexes.  Skin: Skin is warm and dry. She is not diaphoretic.  Nursing note and vitals reviewed.     Assessment & Plan  Problem List Items Addressed This Visit      Cardiovascular and Mediastinum   BP (high blood pressure)    Controlled on losartin 100mg  and amlodipine 2.5 mg and will check renal panel.      Relevant Medications   losartan (COZAAR) 100 MG tablet   amLODipine (NORVASC) 2.5 MG tablet   Other Relevant Orders   Renal Function Panel     Respiratory   AB (asthmatic bronchitis), mild intermittent, uncomplicated   Relevant Medications   albuterol (PROVENTIL HFA;VENTOLIN HFA) 108 (90 Base) MCG/ACT inhaler   AB (asthmatic  bronchitis)    Controlled on Proventil inhaler prn basis.      Relevant Medications   albuterol (PROVENTIL HFA;VENTOLIN HFA) 108 (90 Base) MCG/ACT inhaler     Endocrine   Diabetes (HCC) - Primary    Controlled on metformen 500mg  bid and will check A1C and renal function.      Relevant Medications   losartan (COZAAR) 100 MG tablet   glucose blood (ACCU-CHEK AVIVA PLUS) test strip   metFORMIN (GLUCOPHAGE) 500 MG tablet   Other Relevant Orders   Hemoglobin A1c (Completed)   Lipid panel   Renal Function Panel     Other   Familial multiple lipoprotein-type hyperlipidemia    Controlled by diet and OTC omega 3.      Relevant Medications   losartan (COZAAR) 100 MG tablet   amLODipine (NORVASC) 2.5 MG tablet   Omega 3 1000 MG CAPS   Other Relevant Orders   Lipid panel   Recurrent major depressive disorder, in partial remission (HCC)    Controlled on escitalopram 10 mg daily.      Relevant Medications   escitalopram (LEXAPRO) 10 MG tablet    Other Visit Diagnoses    Acute non-recurrent pansinusitis       symptomatic acute maxillary and frontal sinusitis treated with aithromycin 250mg .   Relevant Medications   azithromycin (ZITHROMAX) 250 MG tablet   Age-related osteoporosis without current pathological fracture       Fosamx resumed by Dr Honor Junes    Relevant Medications   alendronate (FOSAMAX) 70 MG tablet      Meds ordered this encounter  Medications  . losartan (COZAAR) 100 MG tablet    Sig: Take 1 tablet (100 mg total) by mouth daily.    Dispense:  90 tablet    Refill:  1  . amLODipine (NORVASC) 2.5 MG tablet    Sig: Take 1 tablet (2.5 mg total) by mouth daily.    Dispense:  30 tablet    Refill:  1  . glucose blood (ACCU-CHEK AVIVA PLUS) test strip    Sig: USE ONE STRIP TO CHECK GLUCOSE ONCE DAILY    Dispense:  50 each    Refill:  11    Please consider 90 day supplies to promote better adherence  . metFORMIN (GLUCOPHAGE) 500 MG tablet    Sig: Take 1 tablet  (500  mg total) by mouth 2 (two) times daily.    Dispense:  180 tablet    Refill:  6  . escitalopram (LEXAPRO) 10 MG tablet    Sig: Take 1 tablet (10 mg total) by mouth daily.    Dispense:  30 tablet    Refill:  6  . albuterol (PROVENTIL HFA;VENTOLIN HFA) 108 (90 Base) MCG/ACT inhaler    Sig: Inhale 1 puff into the lungs QID.    Dispense:  1 Inhaler    Refill:  11  . Omega 3 1000 MG CAPS    Sig: Take 1 capsule (1,000 mg total) by mouth daily.    Dispense:  90 each    Refill:  3  . azithromycin (ZITHROMAX) 250 MG tablet    Sig: 2 today then 1 a day for 4 days    Dispense:  6 tablet    Refill:  0      Dr. Mac[facility]  02/14/18

## 2018-02-14 NOTE — Assessment & Plan Note (Signed)
Controlled on metformen 500mg  bid and will check A1C and renal function.

## 2018-02-14 NOTE — Assessment & Plan Note (Signed)
Controlled on Proventil inhaler prn basis.

## 2018-02-14 NOTE — Assessment & Plan Note (Signed)
Controlled on losartin 100mg  and amlodipine 2.5 mg and will check renal panel.

## 2018-02-14 NOTE — Assessment & Plan Note (Signed)
Controlled by diet and OTC omega 3.

## 2018-02-15 LAB — RENAL FUNCTION PANEL
Albumin: 4.3 g/dL (ref 3.5–4.8)
BUN / CREAT RATIO: 17 (ref 12–28)
BUN: 11 mg/dL (ref 8–27)
CALCIUM: 10 mg/dL (ref 8.7–10.3)
CO2: 24 mmol/L (ref 20–29)
CREATININE: 0.66 mg/dL (ref 0.57–1.00)
Chloride: 102 mmol/L (ref 96–106)
GFR calc non Af Amer: 87 mL/min/{1.73_m2} (ref >59)
GFR, EST AFRICAN AMERICAN: 100 mL/min/{1.73_m2} (ref >59)
Glucose: 105 mg/dL — ABNORMAL HIGH (ref 65–99)
Phosphorus: 3 mg/dL (ref 2.5–4.5)
Potassium: 4.4 mmol/L (ref 3.5–5.2)
SODIUM: 143 mmol/L (ref 134–144)

## 2018-02-15 LAB — LIPID PANEL
CHOLESTEROL TOTAL: 228 mg/dL — AB (ref 100–199)
Chol/HDL Ratio: 3.9 ratio (ref 0.0–4.4)
HDL: 59 mg/dL (ref >39)
LDL Calculated: 150 mg/dL — ABNORMAL HIGH (ref 0–99)
TRIGLYCERIDES: 95 mg/dL (ref 0–149)
VLDL CHOLESTEROL CAL: 19 mg/dL (ref 5–40)

## 2018-04-13 ENCOUNTER — Other Ambulatory Visit: Payer: Self-pay

## 2018-04-24 IMAGING — MR MR BRAIN/IAC WO/W
11 of 12 series · 40 of 48 positions shown · IV contrast (multihance)
Comparison: None.

CLINICAL DATA: Right greater than left sensorineural hearing loss.

EXAM:
MRI HEAD WITHOUT AND WITH CONTRAST
TECHNIQUE: Multiplanar, multiecho pulse sequences of the brain and surrounding
structures were obtained without and with intravenous contrast.
CONTRAST:  12mL MULTIHANCE GADOBENATE DIMEGLUMINE 529 MG/ML IV SOLN

[Series 3: DWI · axial · 3.0mm · 1.20mm/px · z∈[-90,+70]mm · 8 of 55 slices shown (1 of 2)]
[im 1/55]
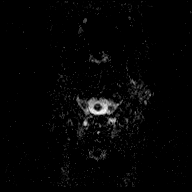
[im 8/55]
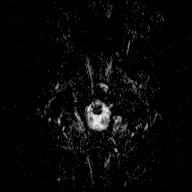
[im 16/55]
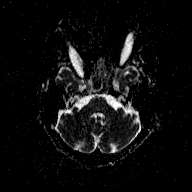
[im 24/55]
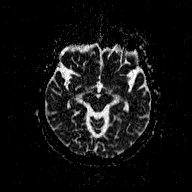
[im 31/55]
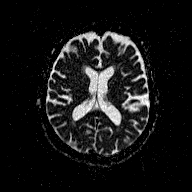
[im 39/55]
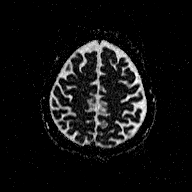
[im 47/55]
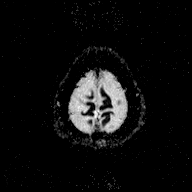
[im 55/55]
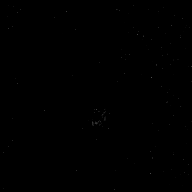

[Series 4: T1 · sagittal · 5.0mm · 0.45mm/px · 4 of 23 slices shown (1 of 3)]
[im 1/23]
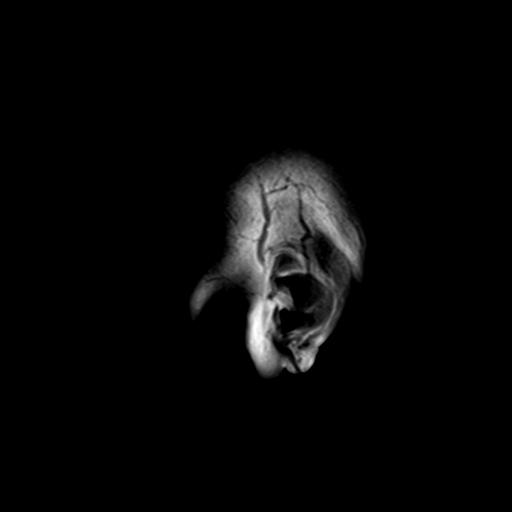
[im 8/23]
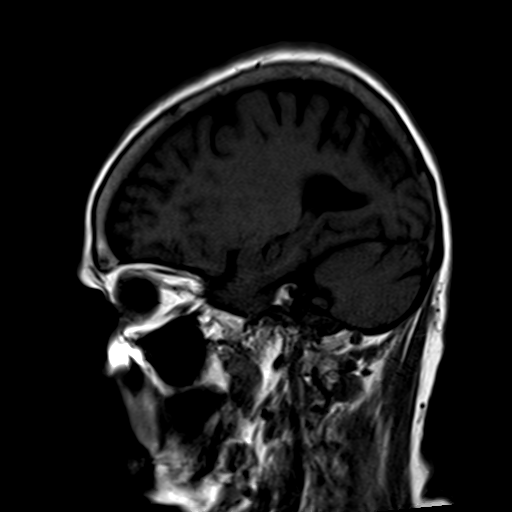
[im 15/23]
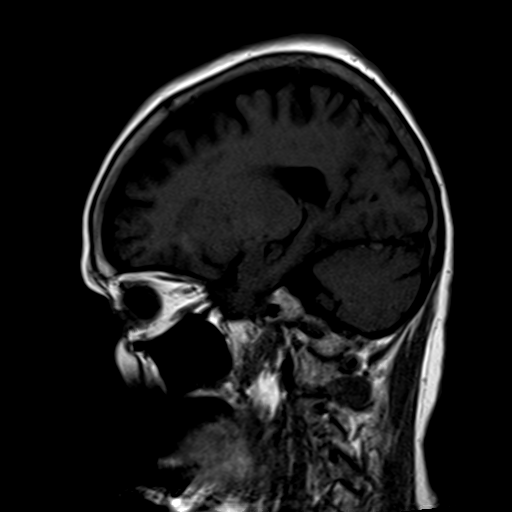
[im 23/23]
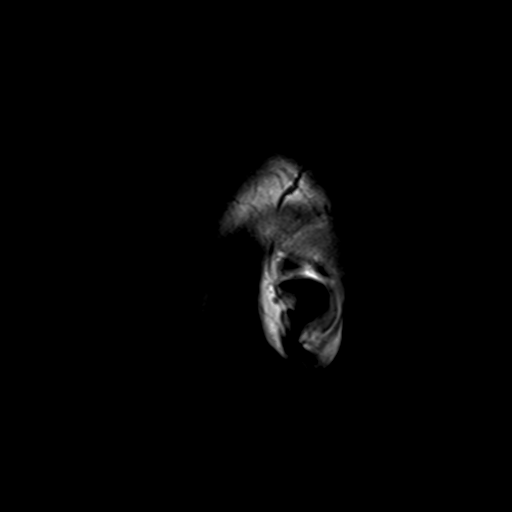

[Series 5: T2 · axial · 5.0mm · 0.72mm/px · z∈[-81,+60]mm · 4 of 23 slices shown (1 of 2)]
[im 1/23]
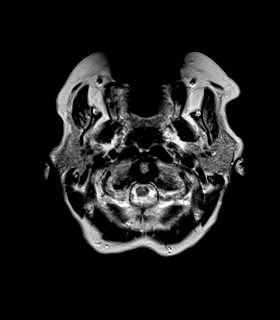
[im 8/23]
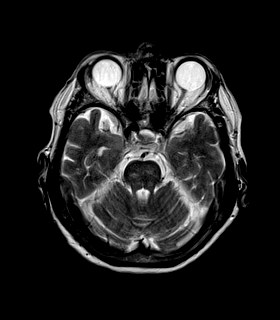
[im 15/23]
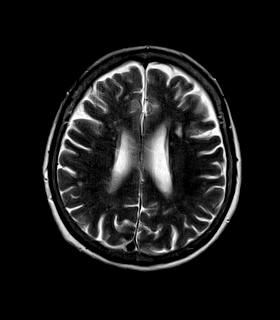
[im 23/23]
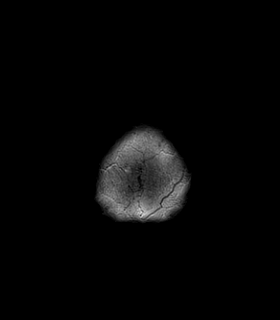

[Series 6: T2 · axial · 5.0mm · 0.72mm/px · z∈[-81,+60]mm · 3 of 23 slices shown (2 of 2)]
[im 1/23]
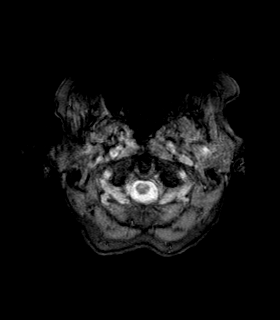
[im 12/23]
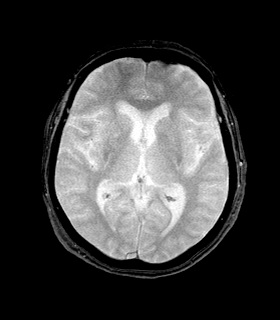
[im 23/23]
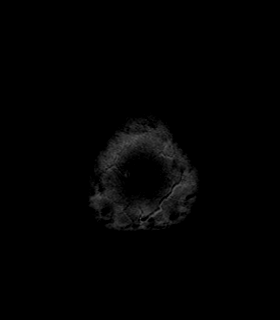

[Series 7: FLAIR · axial · 5.0mm · 0.45mm/px · z∈[-81,+60]mm · 3 of 23 slices shown]
[im 1/23]
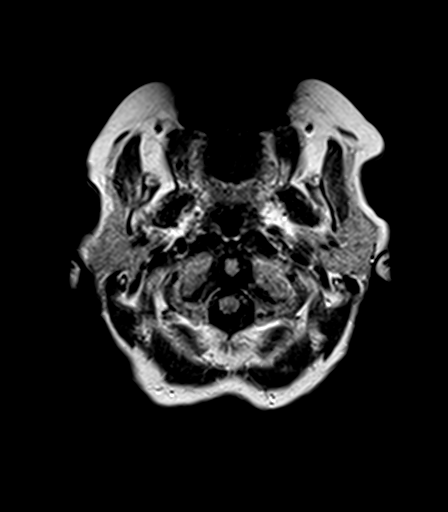
[im 12/23]
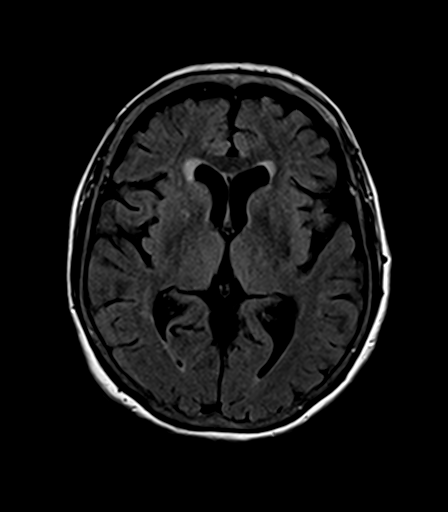
[im 23/23]
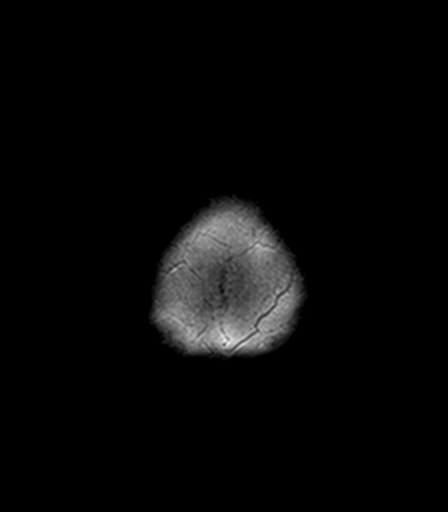

[Series 9: T1 · coronal · 3.0mm · 0.37mm/px · 1 of 11 slices shown (2 of 3)]
[im 1/11]
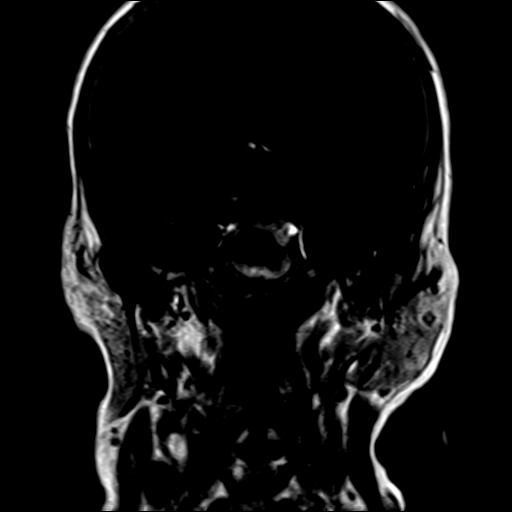

[Series 11: T1 · axial · 3.0mm · 0.37mm/px · 1 of 11 slices shown (3 of 3)]
[im 1/11]
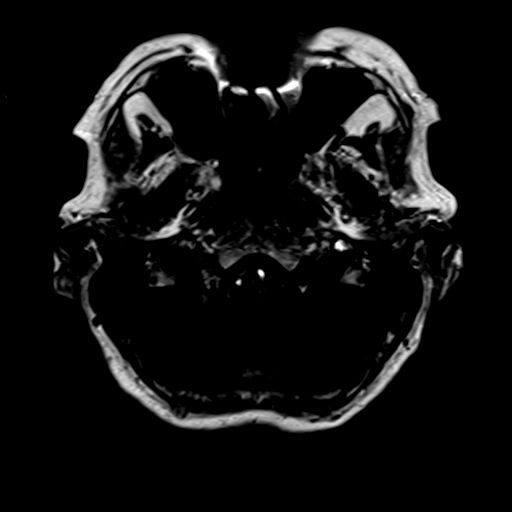

[Series 13: T1 post-contrast · coronal · 3.0mm · 0.37mm/px · 1 of 11 slices shown (1 of 3)]
[im 1/11]
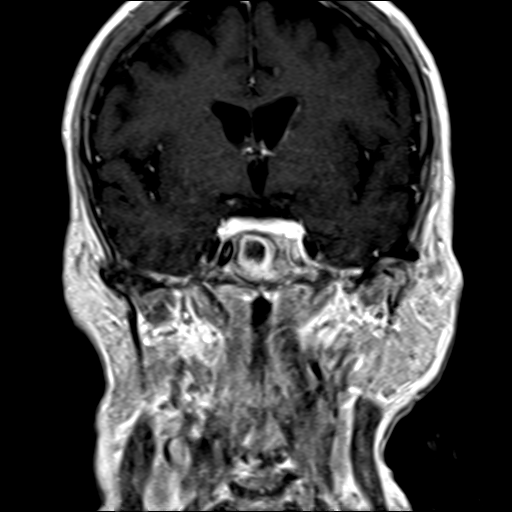

[Series 14: T1 post-contrast · axial · 3.0mm · 1.00mm/px · z∈[-88,+63]mm · 7 of 52 slices shown (2 of 3)]
[im 1/52]
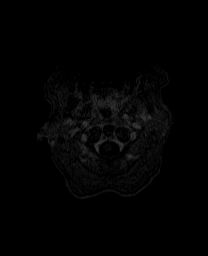
[im 9/52]
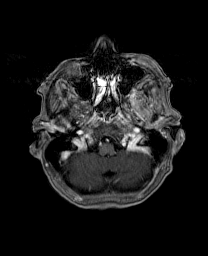
[im 18/52]
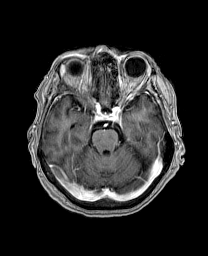
[im 26/52]
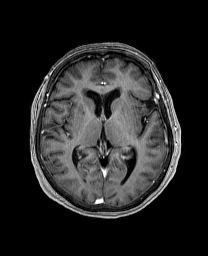
[im 35/52]
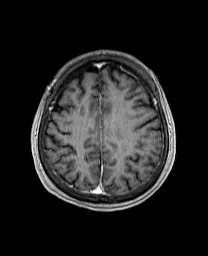
[im 43/52]
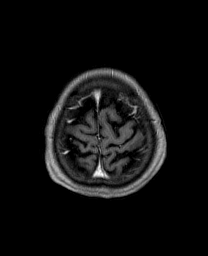
[im 52/52]
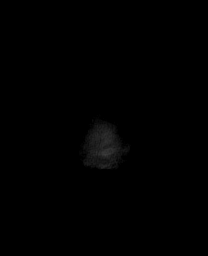

[Series 15: T1 post-contrast · axial · 3.0mm · 0.37mm/px · 1 of 11 slices shown (3 of 3)]
[im 1/11]
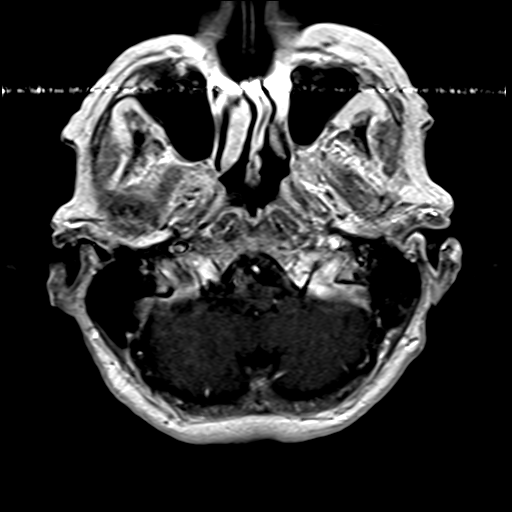

[Series 100: DWI · axial · 3.0mm · 1.20mm/px · z∈[-90,+70]mm · 7 of 55 slices shown (2 of 2)]
[im 1/55]
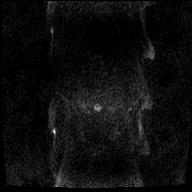
[im 10/55]
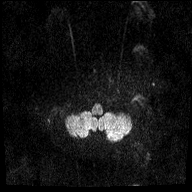
[im 19/55]
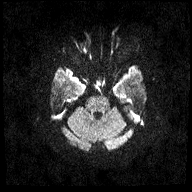
[im 28/55]
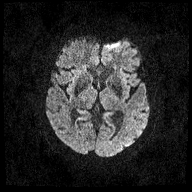
[im 37/55]
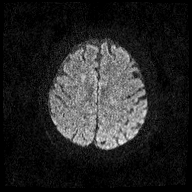
[im 46/55]
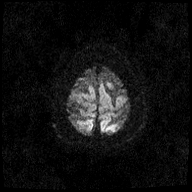
[im 55/55]
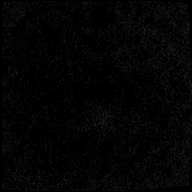

[40 of 48 positions shown; findings below may reference images not displayed]

FINDINGS: The study is mildly motion degraded.

Brain: There is no evidence of acute infarct, intracranial
hemorrhage, mass, midline shift, or extra-axial fluid collection.
Scattered T2 hyperintensities in the cerebral white matter and pons
are nonspecific but compatible with mild chronic small vessel
ischemic disease. There is mild cerebral atrophy. No abnormal
enhancement is identified.

Dedicated imaging through the internal auditory canals demonstrates
a normal course of cranial nerves VII and VIII without evidence of
mass or abnormal enhancement. Inner ear structures demonstrate
normal signal bilaterally. No mass is seen within the
cerebellopontine angles.

Vascular: Major intracranial vascular flow voids are preserved.

Skull and upper cervical spine: Unremarkable bone marrow signal.

Sinuses/Orbits: Near complete opacification of the right sphenoid
sinus by circumferential mucosal thickening and complex central
fluid or other material. Clear mastoid air cells. Bilateral cataract
extraction.

Other: None.
IMPRESSION: 1. Negative IAC imaging.  No vestibular schwannoma.
2. Mild chronic small vessel ischemic disease.
3. Right sphenoid sinusitis.

## 2018-06-10 DIAGNOSIS — E785 Hyperlipidemia, unspecified: Secondary | ICD-10-CM | POA: Diagnosis not present

## 2018-06-10 DIAGNOSIS — E1169 Type 2 diabetes mellitus with other specified complication: Secondary | ICD-10-CM | POA: Diagnosis not present

## 2018-06-10 DIAGNOSIS — E119 Type 2 diabetes mellitus without complications: Secondary | ICD-10-CM | POA: Diagnosis not present

## 2018-06-10 DIAGNOSIS — M810 Age-related osteoporosis without current pathological fracture: Secondary | ICD-10-CM | POA: Diagnosis not present

## 2018-07-01 DIAGNOSIS — M810 Age-related osteoporosis without current pathological fracture: Secondary | ICD-10-CM | POA: Diagnosis not present

## 2018-08-22 ENCOUNTER — Telehealth: Payer: Self-pay

## 2018-08-22 NOTE — Telephone Encounter (Signed)
Needs appt for Labs for Gap in Care before 2020. No VM.

## 2018-08-26 ENCOUNTER — Ambulatory Visit: Payer: Medicare Other | Admitting: Family Medicine

## 2018-09-01 ENCOUNTER — Ambulatory Visit
Admission: RE | Admit: 2018-09-01 | Discharge: 2018-09-01 | Disposition: A | Discharge status: Home / Self Care | Payer: Medicare Other | Source: Ambulatory Visit | Attending: Family Medicine | Admitting: Family Medicine

## 2018-09-01 ENCOUNTER — Encounter: Payer: Self-pay | Admitting: Family Medicine

## 2018-09-01 ENCOUNTER — Ambulatory Visit
Admission: RE | Admit: 2018-09-01 | Discharge: 2018-09-01 | Disposition: A | Discharge status: Home / Self Care | Payer: Medicare Other | Attending: Family Medicine | Admitting: Family Medicine

## 2018-09-01 ENCOUNTER — Ambulatory Visit (INDEPENDENT_AMBULATORY_CARE_PROVIDER_SITE_OTHER): Payer: Medicare Other | Admitting: Family Medicine

## 2018-09-01 VITALS — BP 142/82 | HR 96 | Resp 16 | Ht 63.0 in | Wt 138.0 lb

## 2018-09-01 DIAGNOSIS — M25552 Pain in left hip: Principal | ICD-10-CM

## 2018-09-01 DIAGNOSIS — M25551 Pain in right hip: Secondary | ICD-10-CM

## 2018-09-01 DIAGNOSIS — M1611 Unilateral primary osteoarthritis, right hip: Secondary | ICD-10-CM | POA: Diagnosis not present

## 2018-09-01 DIAGNOSIS — E119 Type 2 diabetes mellitus without complications: Secondary | ICD-10-CM | POA: Diagnosis not present

## 2018-09-01 DIAGNOSIS — E7849 Other hyperlipidemia: Secondary | ICD-10-CM | POA: Diagnosis not present

## 2018-09-01 DIAGNOSIS — M1612 Unilateral primary osteoarthritis, left hip: Secondary | ICD-10-CM | POA: Diagnosis not present

## 2018-09-01 NOTE — Progress Notes (Signed)
Date:  09/01/2018   Name:  Jill Guzman   DOB:  06-18-1941   MRN:  [id number]   Chief Complaint: Diabetes (Ranges 100-130 ) and Hyperlipidemia (MUST order Lipid Panel for GAP)  Diabetes  She presents for her follow-up diabetic visit. She has type 2 diabetes mellitus. Her disease course has been stable. Pertinent negatives for hypoglycemia include no confusion, dizziness, headaches, hunger, mood changes, nervousness/anxiousness, pallor, seizures, sleepiness, speech difficulty, sweats or tremors. Pertinent negatives for diabetes include no blurred vision, no chest pain, no fatigue, no foot paresthesias, no foot ulcerations, no polydipsia, no polyphagia, no polyuria, no visual change, no weakness and no weight loss. There are no hypoglycemic complications. Symptoms are stable. There are no diabetic complications. There are no known risk factors for coronary artery disease. Current diabetic treatment includes oral agent (monotherapy). She is compliant with treatment all of the time. Her weight is stable. She is following a generally healthy diet. Her breakfast blood glucose is taken between 8-9 am. Her breakfast blood glucose range is generally 110-130 mg/dl. Her bedtime blood glucose range is generally 140-180 mg/dl. An ACE inhibitor/angiotensin II receptor blocker is being taken. She does not see a podiatrist.Eye exam is current.  Hyperlipidemia  This is a chronic problem. The current episode started more than 1 year ago. The problem is controlled. Recent lipid tests were reviewed and are normal. Exacerbating diseases include diabetes. She has no history of chronic renal disease, hypothyroidism, liver disease, obesity or nephrotic syndrome. Factors aggravating her hyperlipidemia include thiazides. Pertinent negatives include no chest pain, focal sensory loss, focal weakness, leg pain, myalgias or shortness of breath. Current antihyperlipidemic treatment includes statins. The current treatment  provides moderate improvement of lipids. There are no compliance problems.  Risk factors for coronary artery disease include dyslipidemia, hypertension and obesity.  Hip Pain   The incident occurred more than 1 week ago. There was no injury mechanism. The pain is present in the left hip and right hip. The quality of the pain is described as aching. The pain is at a severity of 3/10. The pain is moderate. Pertinent negatives include no inability to bear weight, loss of motion, loss of sensation, muscle weakness, numbness or tingling. Associated symptoms comments: Left led g seems shorter. The symptoms are aggravated by movement. She has tried acetaminophen for the symptoms.    Review of Systems  Constitutional: Negative.  Negative for chills, fatigue, fever, unexpected weight change and weight loss.  HENT: Negative for congestion, ear discharge, ear pain, rhinorrhea, sinus pressure, sneezing and sore throat.   Eyes: Negative for blurred vision, photophobia, pain, discharge, redness and itching.  Respiratory: Negative for cough, shortness of breath, wheezing and stridor.   Cardiovascular: Negative for chest pain.  Gastrointestinal: Negative for abdominal pain, blood in stool, constipation, diarrhea, nausea and vomiting.  Endocrine: Negative for cold intolerance, heat intolerance, polydipsia, polyphagia and polyuria.  Genitourinary: Negative for dysuria, flank pain, frequency, hematuria, menstrual problem, pelvic pain, urgency, vaginal bleeding and vaginal discharge.  Musculoskeletal: Negative for arthralgias, back pain and myalgias.  Skin: Negative for pallor and rash.  Allergic/Immunologic: Negative for environmental allergies and food allergies.  Neurological: Negative for dizziness, tingling, tremors, focal weakness, seizures, speech difficulty, weakness, light-headedness, numbness and headaches.  Hematological: Negative for adenopathy. Does not bruise/bleed easily.  Psychiatric/Behavioral:  Negative for confusion and dysphoric mood. The patient is not nervous/anxious.     Patient Active Problem List   Diagnosis Date Noted  . Recurrent major depressive disorder,  in partial remission ([place]) 02/14/2018  . Chronic anxiety 02/09/2017  . AB (asthmatic bronchitis), mild intermittent, uncomplicated 18/56/3149  . Chronic obstructive pulmonary disease ([place]) 07/14/2016  . 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 Routine general medical examination at a health care facility 12/31/2014  . Diabetes ([place]) 12/31/2014  . BP (high blood pressure) 12/31/2014  . Osteopenia 12/31/2014    Allergies  Allergen Reactions  . Penicillins   . Sulfa Antibiotics     Past Surgical History:  Procedure Laterality Date  . ABDOMINAL HYSTERECTOMY    . BREAST EXCISIONAL BIOPSY Left 1976   neg  . COLONOSCOPY  2014   normal  . cyst on bladder removed    . cyst removed breast Left   . PARTIAL HYSTERECTOMY      Social History   Tobacco Use  . Smoking status: Former Smoker    Packs/day: 2.00    Years: 10.00    Pack years: 20.00    Types: Cigarettes    Last attempt to quit: 2001    Years since quitting: 18.9  . Smokeless tobacco: Never Used  . Tobacco comment: Smoking cessation materials not required  Substance Use Topics  . Alcohol use: No    Alcohol/week: 0.0 standard drinks  . Drug use: No     Medication list has been reviewed and updated.  Current Meds  Medication Sig  . albuterol (PROVENTIL HFA;VENTOLIN HFA) 108 (90 Base) MCG/ACT inhaler Inhale 1 puff into the lungs QID.  Marland Kitchen amLODipine (NORVASC) 2.5 MG tablet Take 1 tablet (2.5 mg total) by mouth daily.  . Calcium Carbonate-Vitamin D (CALCIUM HIGH POTENCY/VITAMIN D) 600-200 MG-UNIT TABS Take 1 tablet by mouth daily.  Marland Kitchen escitalopram (LEXAPRO) 10 MG tablet Take 1 tablet (10 mg total) by mouth daily.  Marland Kitchen glucose blood (ACCU-CHEK AVIVA  PLUS) test strip USE ONE STRIP TO CHECK GLUCOSE ONCE DAILY  . losartan (COZAAR) 100 MG tablet Take 1 tablet (100 mg total) by mouth daily.  . metFORMIN (GLUCOPHAGE) 500 MG tablet Take 1 tablet (500 mg total) by mouth 2 (two) times daily.  . Omega 3 1000 MG CAPS Take 1 capsule (1,000 mg total) by mouth daily.  . [DISCONTINUED] alendronate (FOSAMAX) 70 MG tablet Take 1 tablet by mouth once a week.    PHQ 2/9 Scores 09/01/2018 02/14/2018 08/26/2017 08/26/2017  PHQ - 2 Score 2 0 1 1  PHQ- 9 Score 10 1 1 3     Physical Exam Vitals signs reviewed.  Constitutional:      General: She is not in acute distress.    Appearance: She is not diaphoretic.  HENT:     Head: Normocephalic and atraumatic.     Right Ear: External ear normal.     Left Ear: External ear normal. There is impacted cerumen.     Nose: Nose normal.  Eyes:     General:        Right eye: No discharge.        Left eye: No discharge.     Conjunctiva/sclera: Conjunctivae normal.     Pupils: Pupils are equal, round, and reactive to light.  Neck:     Musculoskeletal: Normal range of motion and neck supple.     Thyroid: No thyromegaly.     Vascular: No JVD.  Cardiovascular:     Rate and Rhythm: Normal rate and regular rhythm.     Heart sounds: Normal heart sounds. No murmur. No  friction rub. No gallop.   Pulmonary:     Effort: Pulmonary effort is normal.     Breath sounds: Normal breath sounds.  Abdominal:     General: Bowel sounds are normal.     Palpations: Abdomen is soft. There is no mass.     Tenderness: There is no abdominal tenderness. There is no guarding.  Musculoskeletal: Normal range of motion.  Lymphadenopathy:     Cervical: No cervical adenopathy.  Skin:    General: Skin is warm and dry.     Capillary Refill: Capillary refill takes less than 2 seconds.  Neurological:     Mental Status: She is alert.     Deep Tendon Reflexes: Reflexes are normal and symmetric.     BP (!) 142/82   Pulse 96   Resp 16    Ht 5\' 3"  (1.6 m)   Wt 138 lb (62.6 kg)   SpO2 (!) 74%   BMI 24.45 kg/m   Assessment and Plan: 1. Pain of both hip joints New onset.  Patient was told she has had discrepancy in leg length.  Evaluate with bilateral hip  views. - DG HIPS BILAT WITH PELVIS 3-4 VIEWS; Future - DG HIP UNILAT W OR W/O PELVIS 2-3 VIEWS LEFT; Future - DG HIP UNILAT W OR W/O PELVIS 2-3 VIEWS RIGHT; Future  2. Type 2 diabetes mellitus without complication, without long-term current use of insulin (HCC) Chronic.  Controlled.  Will check hemoglobin A1c. - Hemoglobin A1c  3. Familial multiple lipoprotein-type hyperlipidemia Patient has Medicare gaps close concerning hyperlipidemia we will check lipid panel at this time. - Lipid panel

## 2018-09-02 LAB — LIPID PANEL
CHOL/HDL RATIO: 4.3 ratio (ref 0.0–4.4)
Cholesterol, Total: 271 mg/dL — ABNORMAL HIGH (ref 100–199)
HDL: 63 mg/dL (ref >39)
LDL CALC: 186 mg/dL — AB (ref 0–99)
Triglycerides: 110 mg/dL (ref 0–149)
VLDL CHOLESTEROL CAL: 22 mg/dL (ref 5–40)

## 2018-09-02 LAB — HEMOGLOBIN A1C
Est. average glucose Bld gHb Est-mCnc: 157 mg/dL
Hgb A1c MFr Bld: 7.1 % — ABNORMAL HIGH (ref 4.8–5.6)

## 2018-09-05 ENCOUNTER — Other Ambulatory Visit: Payer: Self-pay

## 2018-09-05 DIAGNOSIS — E7849 Other hyperlipidemia: Secondary | ICD-10-CM

## 2018-09-05 MED ORDER — ATORVASTATIN CALCIUM 10 MG PO TABS: 10.0000 mg | ORAL_TABLET | Freq: Every day | ORAL | 1 refills | Status: DC

## 2018-09-05 NOTE — Progress Notes (Unsigned)
Sent in atorvastatin

## 2018-10-10 ENCOUNTER — Ambulatory Visit (INDEPENDENT_AMBULATORY_CARE_PROVIDER_SITE_OTHER): Payer: Medicare Other

## 2018-10-10 VITALS — BP 148/82 | HR 77 | Temp 98.2°F | Resp 15 | Ht 63.0 in | Wt 138.8 lb

## 2018-10-10 DIAGNOSIS — Z0000 Encounter for general adult medical examination without abnormal findings: Secondary | ICD-10-CM | POA: Diagnosis not present

## 2018-10-10 DIAGNOSIS — Z1231 Encounter for screening mammogram for malignant neoplasm of breast: Secondary | ICD-10-CM | POA: Diagnosis not present

## 2018-10-10 NOTE — Patient Instructions (Signed)
[name] , Thank you for taking time to come for your Medicare Wellness Visit. I appreciate your ongoing commitment to your health goals. Please review the following plan we discussed and let me know if I can assist you in the future.   Screening recommendations/referrals: Colonoscopy: no longer required Mammogram: done 09/22/17. Please call [phone] to schedule your mammogram.  Bone Density: done 09/22/17. Repeat in 2021. Recommended yearly ophthalmology/optometry visit for glaucoma screening and checkup Recommended yearly dental visit for hygiene and checkup  Vaccinations: Influenza vaccine: done 06/19/18 Pneumococcal vaccine: done 06/19/18 Tdap vaccine: done 09/20/18 Shingles vaccine: Shingrix discussed. Please contact your pharmacy for coverage information.     Advanced directives: Please bring a copy of your health care power of attorney and living will to the office at your convenience.  Conditions/risks identified: Keep up the great work!  Next appointment: Please follow up in one year for your Medicare Annual Wellness visit.     Preventive Care 76 Years and Older, Female Preventive care refers to lifestyle choices and visits with your health care provider that can promote health and wellness. What does preventive care include?  A yearly physical exam. This is also called an annual well check.  Dental exams once or twice a year.  Routine eye exams. Ask your health care provider how often you should have your eyes checked.  Personal lifestyle choices, including:  Daily care of your teeth and gums.  Regular physical activity.  Eating a healthy diet.  Avoiding tobacco and drug use.  Limiting alcohol use.  Practicing safe sex.  Taking low-dose aspirin every day.  Taking vitamin and mineral supplements as recommended by your health care provider. What happens during an annual well check? The services and screenings done by your health care provider during  your annual well check will depend on your age, overall health, lifestyle risk factors, and family history of disease. Counseling  Your health care provider may ask you questions about your:  Alcohol use.  Tobacco use.  Drug use.  Emotional well-being.  Home and relationship well-being.  Sexual activity.  Eating habits.  History of falls.  Memory and ability to understand (cognition).  Work and work Statistician.  Reproductive health. Screening  You may have the following tests or measurements:  Height, weight, and BMI.  Blood pressure.  Lipid and cholesterol levels. These may be checked every 5 years, or more frequently if you are over 76 years old.  Skin check.  Lung cancer screening. You may have this screening every year starting at age 76 if you have a 30-pack-year history of smoking and currently smoke or have quit within the past 15 years.  Fecal occult blood test (FOBT) of the stool. You may have this test every year starting at age 76.  Flexible sigmoidoscopy or colonoscopy. You may have a sigmoidoscopy every 5 years or a colonoscopy every 10 years starting at age 76.  Hepatitis C blood test.  Hepatitis B blood test.  Sexually transmitted disease (STD) testing.  Diabetes screening. This is done by checking your blood sugar (glucose) after you have not eaten for a while (fasting). You may have this done every 1-3 years.  Bone density scan. This is done to screen for osteoporosis. You may have this done starting at age 76.  Mammogram. This may be done every 1-2 years. Talk to your health care provider about how often you should have regular mammograms. Talk with your health care provider about your test results, treatment options,  and if necessary, the need for more tests. Vaccines  Your health care provider may recommend certain vaccines, such as:  Influenza vaccine. This is recommended every year.  Tetanus, diphtheria, and acellular pertussis (Tdap,  Td) vaccine. You may need a Td booster every 10 years.  Zoster vaccine. You may need this after age 76.  Pneumococcal 13-valent conjugate (PCV13) vaccine. One dose is recommended after age 76.  Pneumococcal polysaccharide (PPSV23) vaccine. One dose is recommended after age 76. Talk to your health care provider about which screenings and vaccines you need and how often you need them. This information is not intended to replace advice given to you by your health care provider. Make sure you discuss any questions you have with your health care provider. Document Released: 09/20/2015 Document Revised: 05/13/2016 Document Reviewed: 06/25/2015 Elsevier Interactive Patient Education  2017 [place] Prevention in the Home Falls can cause injuries. They can happen to people of all ages. There are many things you can do to make your home safe and to help prevent falls. What can I do on the outside of my home?  Regularly fix the edges of walkways and driveways and fix any cracks.  Remove anything that might make you trip as you walk through a door, such as a raised step or threshold.  Trim any bushes or trees on the path to your home.  Use bright outdoor lighting.  Clear any walking paths of anything that might make someone trip, such as rocks or tools.  Regularly check to see if handrails are loose or broken. Make sure that both sides of any steps have handrails.  Any raised decks and porches should have guardrails on the edges.  Have any leaves, snow, or ice cleared regularly.  Use sand or salt on walking paths during winter.  Clean up any spills in your garage right away. This includes oil or grease spills. What can I do in the bathroom?  Use night lights.  Install grab bars by the toilet and in the tub and shower. Do not use towel bars as grab bars.  Use non-skid mats or decals in the tub or shower.  If you need to sit down in the shower, use a plastic, non-slip  stool.  Keep the floor dry. Clean up any water that spills on the floor as soon as it happens.  Remove soap buildup in the tub or shower regularly.  Attach bath mats securely with double-sided non-slip rug tape.  Do not have throw rugs and other things on the floor that can make you trip. What can I do in the bedroom?  Use night lights.  Make sure that you have a light by your bed that is easy to reach.  Do not use any sheets or blankets that are too big for your bed. They should not hang down onto the floor.  Have a firm chair that has side arms. You can use this for support while you get dressed.  Do not have throw rugs and other things on the floor that can make you trip. What can I do in the kitchen?  Clean up any spills right away.  Avoid walking on wet floors.  Keep items that you use a lot in easy-to-reach places.  If you need to reach something above you, use a strong step stool that has a grab bar.  Keep electrical cords out of the way.  Do not use floor polish or wax that makes floors slippery. If  you must use wax, use non-skid floor wax.  Do not have throw rugs and other things on the floor that can make you trip. What can I do with my stairs?  Do not leave any items on the stairs.  Make sure that there are handrails on both sides of the stairs and use them. Fix handrails that are broken or loose. Make sure that handrails are as long as the stairways.  Check any carpeting to make sure that it is firmly attached to the stairs. Fix any carpet that is loose or worn.  Avoid having throw rugs at the top or bottom of the stairs. If you do have throw rugs, attach them to the floor with carpet tape.  Make sure that you have a light switch at the top of the stairs and the bottom of the stairs. If you do not have them, ask someone to add them for you. What else can I do to help prevent falls?  Wear shoes that:  Do not have high heels.  Have rubber bottoms.  Are  comfortable and fit you well.  Are closed at the toe. Do not wear sandals.  If you use a stepladder:  Make sure that it is fully opened. Do not climb a closed stepladder.  Make sure that both sides of the stepladder are locked into place.  Ask someone to hold it for you, if possible.  Clearly mark and make sure that you can see:  Any grab bars or handrails.  First and last steps.  Where the edge of each step is.  Use tools that help you move around (mobility aids) if they are needed. These include:  Canes.  Walkers.  Scooters.  Crutches.  Turn on the lights when you go into a dark area. Replace any light bulbs as soon as they burn out.  Set up your furniture so you have a clear path. Avoid moving your furniture around.  If any of your floors are uneven, fix them.  If there are any pets around you, be aware of where they are.  Review your medicines with your doctor. Some medicines can make you feel dizzy. This can increase your chance of falling. Ask your doctor what other things that you can do to help prevent falls. This information is not intended to replace advice given to you by your health care provider. Make sure you discuss any questions you have with your health care provider. Document Released: 06/20/2009 Document Revised: 01/30/2016 Document Reviewed: 09/28/2014 Elsevier Interactive Patient Education  2017 [employer].

## 2018-10-10 NOTE — Progress Notes (Addendum)
Subjective:   Jill Guzman is a 78 y.o. female who presents for Medicare Annual (Subsequent) preventive examination.  Review of Systems:   Cardiac Risk Factors include: advanced age (>69men, >4 women);diabetes mellitus;dyslipidemia;hypertension     Objective:     Vitals: BP (!) 148/82 (BP Location: Left Arm, Patient Position: Sitting, Cuff Size: Normal)   Pulse 77   Temp 98.2 F (36.8 C) (Oral)   Resp 15   Ht 5\' 3"  (1.6 m)   Wt 138 lb 12.8 oz (63 kg)   SpO2 94%   BMI 24.59 kg/m   Body mass index is 24.59 kg/m.  Advanced Directives 10/10/2018 08/26/2017  Does Patient Have a Medical Advance Directive? Yes Yes  Type of Paramedic of [place];Living will [place];Living will  Copy of [place] in Chart? No - copy requested No - copy requested    Tobacco Social History   Tobacco Use  Smoking Status Former Smoker  . Packs/day: 2.00  . Years: 10.00  . Pack years: 20.00  . Types: Cigarettes  . Last attempt to quit: 2001  . Years since quitting: 19.1  Smokeless Tobacco Never Used  Tobacco Comment   Smoking cessation materials not required     Counseling given: Not Answered Comment: Smoking cessation materials not required   Clinical Intake:  Pre-visit preparation completed: Yes  Pain : No/denies pain     Nutritional Risks: None Diabetes: Yes CBG done?: No Did pt. bring in CBG monitor from home?: No Glucose Meter Downloaded?: No   Nutrition Risk Assessment:  Has the patient had any N/V/D within the last 2 months?  No  Does the patient have any non-healing wounds?  No  Has the patient had any unintentional weight loss or weight gain?  No   Diabetes:  Is the patient diabetic?  Yes  If diabetic, was a CBG obtained today?  No  Did the patient bring in their glucometer from home?  No  How often do you monitor your CBG's? Several times per week or as needed.   Financial Strains and  Diabetes Management:  Are you having any financial strains with the device, your supplies or your medication? No .  Does the patient want to be seen by Chronic Care Management for management of their diabetes?  No  Would the patient like to be referred to a Nutritionist or for Diabetic Management?  No   Diabetic Exams:  Diabetic Eye Exam: Completed 11/24/17 negative retinopathy.   Diabetic Foot Exam: Completed 02/14/18.   How often do you need to have someone help you when you read instructions, pamphlets, or other written materials from your doctor or pharmacy?: 1 - Never What is the last grade level you completed in school?: 12th grade  Interpreter Needed?: No  Information entered by :: [name] LPN  Past Medical History:  Diagnosis Date  . Anxiety   . Bell's palsy    age 34 and age 35   . COPD (chronic obstructive pulmonary disease) ([place])   . Diabetes mellitus without complication ([place])   . Hyperlipidemia   . Hypertension    Past Surgical History:  Procedure Laterality Date  . ABDOMINAL HYSTERECTOMY    . BREAST EXCISIONAL BIOPSY Left 1976   neg  . COLONOSCOPY  2014   normal  . cyst on bladder removed    . cyst removed breast Left   . PARTIAL HYSTERECTOMY     Family History  Problem  Relation Age of Onset  . Diabetes Mother   . Stroke Mother   . Healthy Daughter   . Cancer Son 50       lung  . Hypertension Son   . Rheum arthritis Daughter   . Hypertension Daughter   . Arthritis Daughter   . COPD Son   . Breast cancer Neg Hx    Social History   Socioeconomic History  . Marital status: Divorced    Spouse name: Not on file  . Number of children: 5  . Years of education: Not on file  . Highest education level: 12th grade  Occupational History  . Occupation: Retired  Scientific laboratory technician  . Financial resource strain: Not hard at all  . Food insecurity:    Worry: Never true    Inability: Never true  . Transportation needs:    Medical: No    Non-medical: No    Tobacco Use  . Smoking status: Former Smoker    Packs/day: 2.00    Years: 10.00    Pack years: 20.00    Types: Cigarettes    Last attempt to quit: 2001    Years since quitting: 19.1  . Smokeless tobacco: Never Used  . Tobacco comment: Smoking cessation materials not required  Substance and Sexual Activity  . Alcohol use: No    Alcohol/week: 0.0 standard drinks  . Drug use: No  . Sexual activity: Not Currently  Lifestyle  . Physical activity:    Days per week: 7 days    Minutes per session: 40 min  . Stress: Not at all  Relationships  . Social connections:    Talks on phone: More than three times a week    Gets together: Once a week    Attends religious service: More than 4 times per year    Active member of club or organization: No    Attends meetings of clubs or organizations: Never    Relationship status: Divorced  Other Topics Concern  . Not on file  Social History Narrative   Pt lives by herself, 3 children living.     Outpatient Encounter Medications as of 10/10/2018  Medication Sig  . albuterol (PROVENTIL HFA;VENTOLIN HFA) 108 (90 Base) MCG/ACT inhaler Inhale 1 puff into the lungs QID.  Marland Kitchen amLODipine (NORVASC) 5 MG tablet Take 5 mg by mouth daily.  Marland Kitchen atorvastatin (LIPITOR) 10 MG tablet Take 1 tablet (10 mg total) by mouth daily.  . Cholecalciferol (VITAMIN D) 50 MCG (2000 UT) CAPS Take by mouth.  . escitalopram (LEXAPRO) 10 MG tablet Take 1 tablet (10 mg total) by mouth daily.  Marland Kitchen glucose blood (ACCU-CHEK AVIVA PLUS) test strip USE ONE STRIP TO CHECK GLUCOSE ONCE DAILY  . losartan (COZAAR) 100 MG tablet Take 1 tablet (100 mg total) by mouth daily.  . metFORMIN (GLUCOPHAGE) 500 MG tablet Take 1 tablet (500 mg total) by mouth 2 (two) times daily.  . Omega 3 1000 MG CAPS Take 1 capsule (1,000 mg total) by mouth daily.  . [DISCONTINUED] amLODipine (NORVASC) 2.5 MG tablet Take 1 tablet (2.5 mg total) by mouth daily.  . [DISCONTINUED] Calcium Carbonate-Vitamin D (CALCIUM  HIGH POTENCY/VITAMIN D) 600-200 MG-UNIT TABS Take 1 tablet by mouth daily.   No facility-administered encounter medications on file as of 10/10/2018.     Activities of Daily Living In your present state of health, do you have any difficulty performing the following activities: 10/10/2018  Hearing? Y  Comment wears hearing aids  Vision? N  Difficulty concentrating or making decisions? N  Walking or climbing stairs? N  Dressing or bathing? N  Doing errands, shopping? N  Preparing Food and eating ? N  Using the Toilet? N  In the past six months, have you accidently leaked urine? Y  Comment wears depends for leakage  Do you have problems with loss of bowel control? N  Managing your Medications? N  Managing your Finances? N  Housekeeping or managing your Housekeeping? N  Some recent data might be hidden    Patient Care Team: Juline Patch, MD as PCP - General (Family Medicine)    Assessment:   This is a routine wellness examination for Riannon.  Exercise Activities and Dietary recommendations Current Exercise Habits: Home exercise routine, Type of exercise: walking, Time (Minutes): 40, Frequency (Times/Week): 7, Weekly Exercise (Minutes/Week): 280, Intensity: Mild, Exercise limited by: None identified  Goals    . Exercise 150 min/wk Moderate Activity     Recommend to exercise at least 150 minutes per week    . Patient Stated     Pt states she would like to stay healthy and active.       Fall Risk Fall Risk  10/10/2018 09/01/2018 08/26/2017 08/26/2017 02/09/2017  Falls in the past year? 1 1 Yes Yes No  Comment - - lost balance when she got up to go to the bathroom - -  Number falls in past yr: 1 0 1 1 -  Injury with Fall? 0 0 No No -  Comment pt tripped on rock in her yard, bruised hip and buttocks - - - -  Follow up Falls prevention discussed Falls evaluation completed - - -   FALL RISK PREVENTION PERTAINING TO THE HOME:  Any stairs in or around the home WITH handrails? No    Home free of loose throw rugs in walkways, pet beds, electrical cords, etc? Yes  Adequate lighting in your home to reduce risk of falls? Yes   ASSISTIVE DEVICES UTILIZED TO PREVENT FALLS:  Life alert? No  Use of a cane, walker or w/c? Yes  - occasional use of cane but not lately Grab bars in the bathroom? Yes  Shower chair or bench in shower? Yes  Elevated toilet seat or a handicapped toilet? No   DME ORDERS:  DME order needed?  No   TIMED UP AND GO:  Was the test performed? Yes .  Length of time to ambulate 10 feet: 7 sec.   GAIT:  Appearance of gait: Gait stead-fast and without the use of an assistive device.  Education: Fall risk prevention has been discussed.  Intervention(s) required? No   Depression Screen PHQ 2/9 Scores 10/10/2018 09/01/2018 02/14/2018 08/26/2017  PHQ - 2 Score 2 2 0 1  PHQ- 9 Score 5 10 1 1      Cognitive Function     6CIT Screen 10/10/2018 08/26/2017  What Year? 0 points 0 points  What month? 0 points 0 points  What time? 0 points 0 points  Count back from 20 0 points 0 points  Months in reverse 0 points 0 points  Repeat phrase 2 points 6 points  Total Score 2 6    Immunization History  Administered Date(s) Administered  . Influenza, High Dose Seasonal PF 06/15/2017, 06/19/2018  . Influenza,inj,Quad PF,6+ Mos 07/14/2016  . Influenza-Unspecified 06/07/2015  . Pneumococcal Conjugate-13 06/14/2014, 06/17/2015, 06/19/2018  . Pneumococcal Polysaccharide-23 05/04/2013, 06/14/2014  . Td 09/07/2006  . Tdap 09/20/2018  . Zoster 09/07/2012    Qualifies  for Shingles Vaccine? Yes  Zostavax completed 2014. Due for Shingrix. Education has been provided regarding the importance of this vaccine. Pt has been advised to call insurance company to determine out of pocket expense. Advised may also receive vaccine at local pharmacy or Health Dept. Verbalized acceptance and understanding.  Tdap: Up to date  Flu Vaccine: Up to date  Pneumococcal Vaccine:  Up to date   Screening Tests Health Maintenance  Topic Date Due  . MAMMOGRAM  09/22/2018  . OPHTHALMOLOGY EXAM  11/25/2018  . FOOT EXAM  02/15/2019  . HEMOGLOBIN A1C  03/03/2019  . DEXA SCAN  09/23/2019  . COLONOSCOPY  06/22/2026  . TETANUS/TDAP  09/20/2028  . INFLUENZA VACCINE  Completed  . PNA vac Low Risk Adult  Completed  Cancer Screenings:  Colorectal Screening:  No longer required.   Mammogram: Completed 09/22/17. Repeat every year;  Ordered today. Pt provided with contact information and advised to call to schedule appt.   Bone Density: Completed 09/22/17. Results reflect  OSTEOPOROSIS. Repeat every 2 years.   Lung Cancer Screening: (Low Dose CT Chest recommended if Age 21-80 years, 30 pack-year currently smoking OR have quit w/in 15years.) does not qualify.    Additional Screening:  Hepatitis C Screening: no longer required  Vision Screening: Recommended annual ophthalmology exams for early detection of glaucoma and other disorders of the eye. Is the patient up to date with their annual eye exam?  Yes  Who is the provider or what is the name of the office in which the pt attends annual eye exams? Dr. Atilano Median  Dental Screening: Recommended annual dental exams for proper oral hygiene  Community Resource Referral:  CRR required this visit?  No      Plan:     I have personally reviewed and addressed the Medicare Annual Wellness questionnaire and have noted the following in the patient's chart:  A. Medical and social history B. Use of alcohol, tobacco or illicit drugs  C. Current medications and supplements D. Functional ability and status E.  Nutritional status F.  Physical activity G. Advance directives H. List of other physicians I.  Hospitalizations, surgeries, and ER visits in previous 12 months J.  [place] such as hearing and vision if needed, cognitive and depression L. Referrals and appointments   In addition, I have reviewed and discussed  with patient certain preventive protocols, quality metrics, and best practice recommendations. A written personalized care plan for preventive services as well as general preventive health recommendations were provided to patient.   Signed,  [name], LPN Nurse Health Advisor   Nurse Notes: pt doing well and appreciative of visit today.

## 2018-10-14 DIAGNOSIS — E1169 Type 2 diabetes mellitus with other specified complication: Secondary | ICD-10-CM | POA: Diagnosis not present

## 2018-10-14 DIAGNOSIS — E119 Type 2 diabetes mellitus without complications: Secondary | ICD-10-CM | POA: Diagnosis not present

## 2018-10-14 DIAGNOSIS — E785 Hyperlipidemia, unspecified: Secondary | ICD-10-CM | POA: Diagnosis not present

## 2018-11-03 ENCOUNTER — Other Ambulatory Visit: Payer: Self-pay | Admitting: Family Medicine

## 2018-11-03 DIAGNOSIS — F3341 Major depressive disorder, recurrent, in partial remission: Secondary | ICD-10-CM

## 2018-11-03 DIAGNOSIS — E7849 Other hyperlipidemia: Secondary | ICD-10-CM

## 2018-12-27 ENCOUNTER — Other Ambulatory Visit: Payer: Self-pay | Admitting: Family Medicine

## 2018-12-27 DIAGNOSIS — E7849 Other hyperlipidemia: Secondary | ICD-10-CM

## 2019-01-14 IMAGING — CR DG HIP (WITH OR WITHOUT PELVIS) 2-3V*L*
2 series · 2 of 2 positions shown · non-contrast
Comparison: Lumbar spine films of 02/06/2010

CLINICAL DATA: Bilateral hip pain for approximately 8 months, some
radiculopathy

EXAM:
DG HIP (WITH OR WITHOUT PELVIS) 2-3V LEFT

[hip ap]
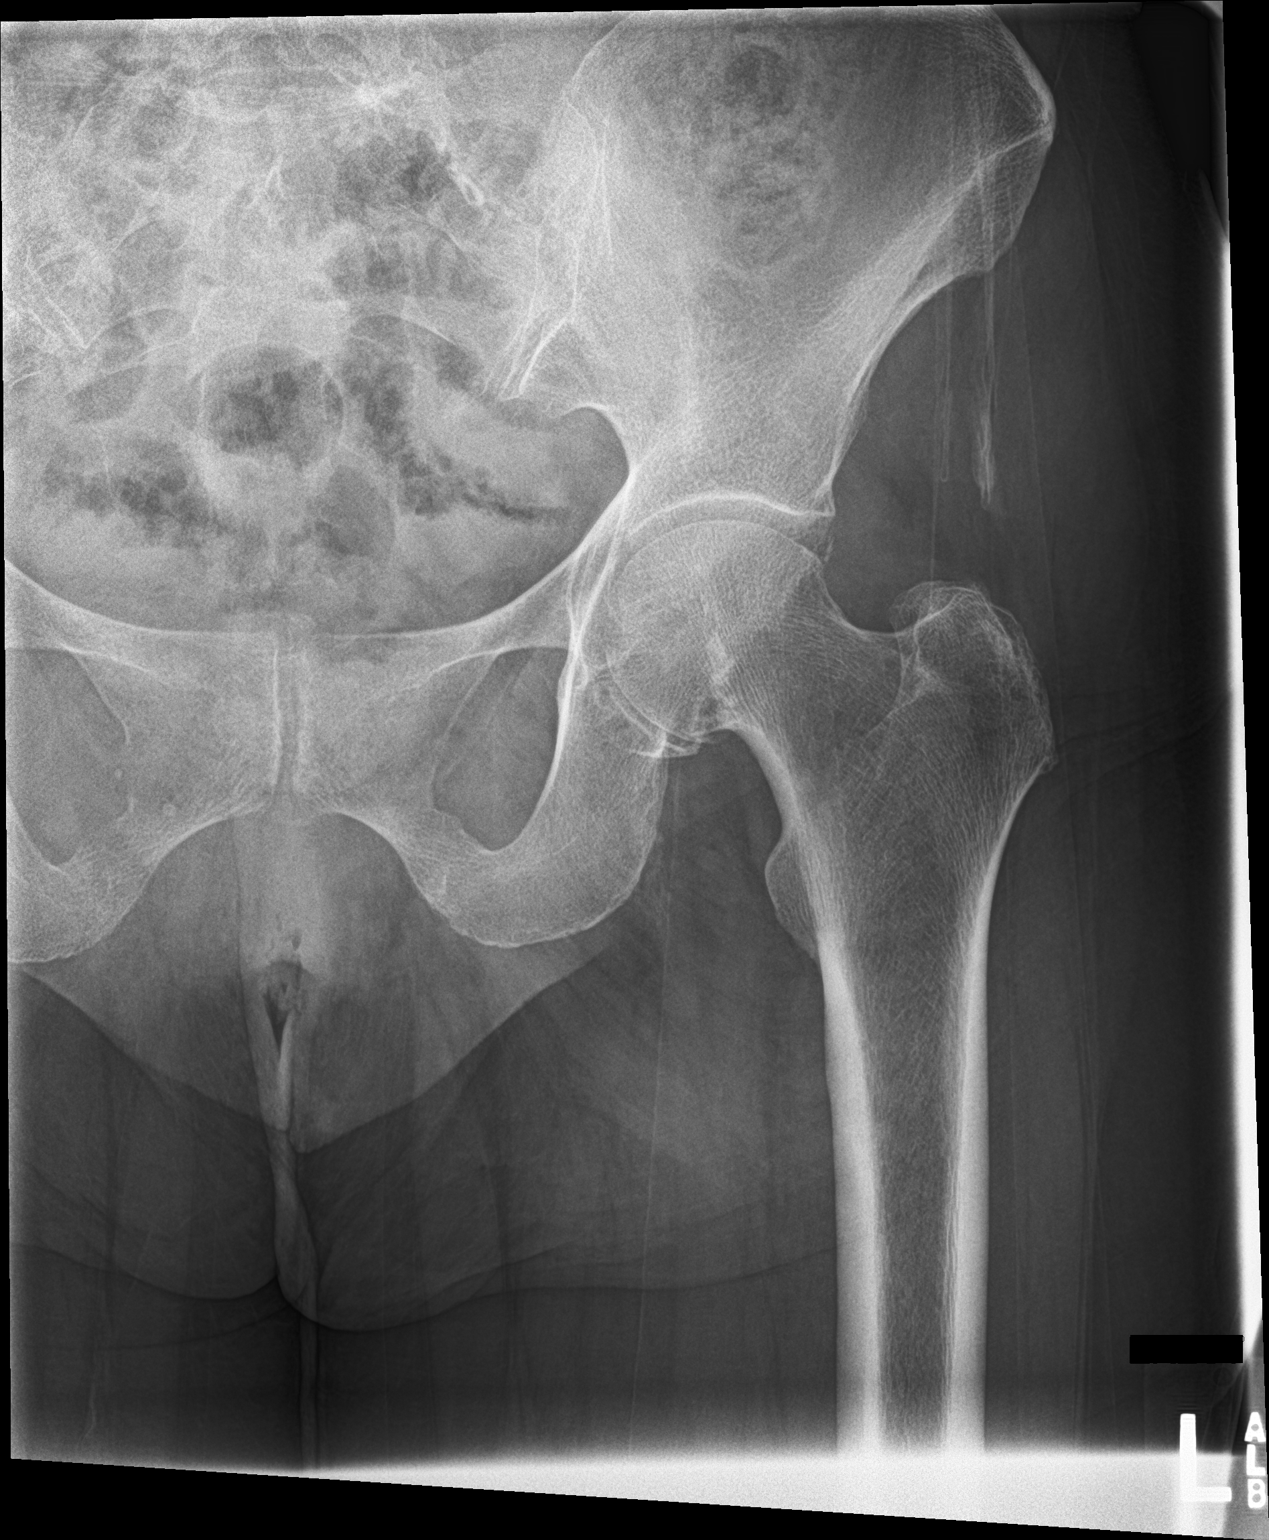

[hip lat]
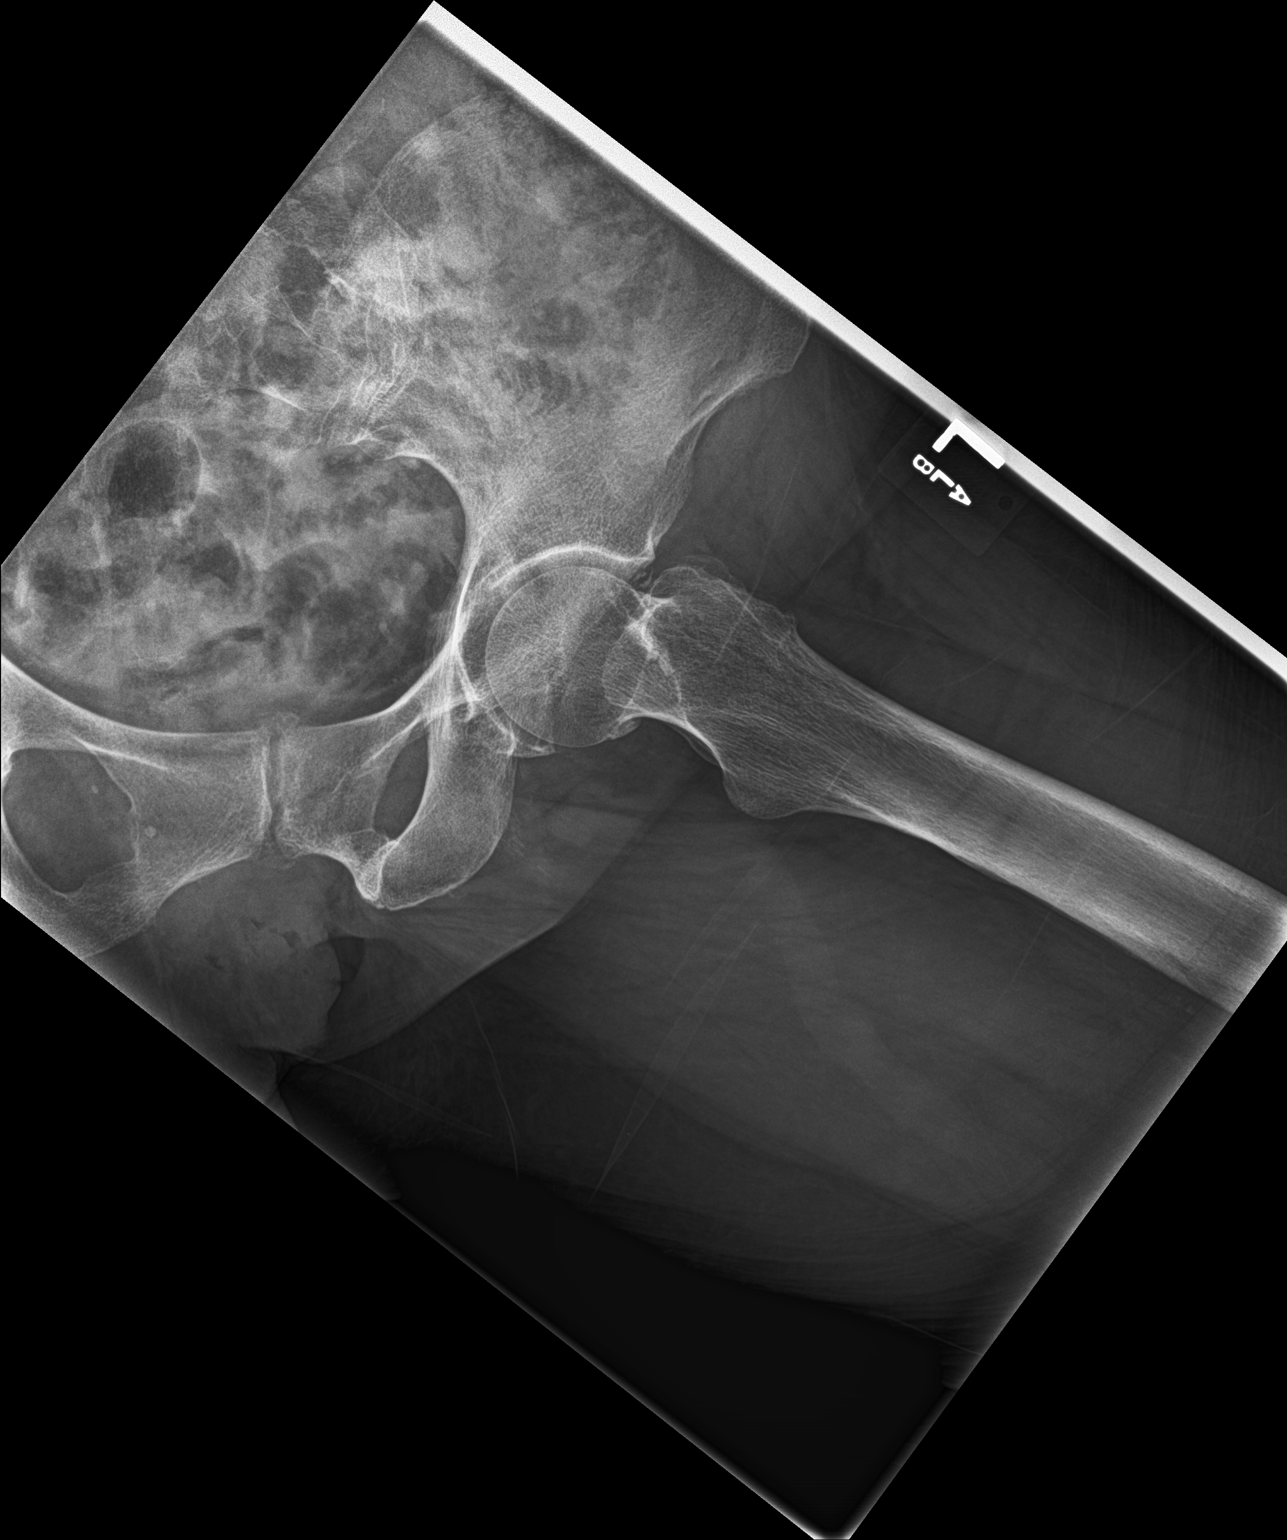

[2 of 2 positions shown; findings below may reference images not displayed]

FINDINGS: No acute hip fracture is seen. Moderate degenerative joint disease
of the left hip is noted. The bones are diffusely osteopenic. The
left pelvic ramus appears intact. The left SI joint appears
corticated.
IMPRESSION: Osteopenia.  Mild degenerative change of the left hip.  No fracture.

## 2019-02-03 ENCOUNTER — Other Ambulatory Visit: Payer: Self-pay

## 2019-02-03 DIAGNOSIS — G8929 Other chronic pain: Secondary | ICD-10-CM

## 2019-02-03 MED ORDER — PREDNISONE 10 MG PO TABS: 10.0000 mg | ORAL_TABLET | Freq: Every day | ORAL | 0 refills | Status: DC

## 2019-02-04 ENCOUNTER — Other Ambulatory Visit: Payer: Self-pay

## 2019-02-04 ENCOUNTER — Emergency Department
Admission: EM | Admit: 2019-02-04 | Discharge: 2019-02-05 | Disposition: A | Discharge status: Home / Self Care | Payer: Medicare Other | Attending: Emergency Medicine | Admitting: Emergency Medicine

## 2019-02-04 ENCOUNTER — Emergency Department: Payer: Medicare Other

## 2019-02-04 DIAGNOSIS — E119 Type 2 diabetes mellitus without complications: Secondary | ICD-10-CM | POA: Insufficient documentation

## 2019-02-04 DIAGNOSIS — Z7984 Long term (current) use of oral hypoglycemic drugs: Secondary | ICD-10-CM | POA: Diagnosis not present

## 2019-02-04 DIAGNOSIS — Z87891 Personal history of nicotine dependence: Secondary | ICD-10-CM | POA: Diagnosis not present

## 2019-02-04 DIAGNOSIS — Z79899 Other long term (current) drug therapy: Secondary | ICD-10-CM | POA: Insufficient documentation

## 2019-02-04 DIAGNOSIS — M545 Low back pain, unspecified: Secondary | ICD-10-CM

## 2019-02-04 DIAGNOSIS — J449 Chronic obstructive pulmonary disease, unspecified: Secondary | ICD-10-CM | POA: Insufficient documentation

## 2019-02-04 DIAGNOSIS — I10 Essential (primary) hypertension: Secondary | ICD-10-CM | POA: Insufficient documentation

## 2019-02-04 DIAGNOSIS — M5137 Other intervertebral disc degeneration, lumbosacral region: Secondary | ICD-10-CM | POA: Diagnosis not present

## 2019-02-04 DIAGNOSIS — S3992XA Unspecified injury of lower back, initial encounter: Secondary | ICD-10-CM | POA: Diagnosis not present

## 2019-02-04 DIAGNOSIS — M5127 Other intervertebral disc displacement, lumbosacral region: Secondary | ICD-10-CM | POA: Diagnosis not present

## 2019-02-04 DIAGNOSIS — R0902 Hypoxemia: Secondary | ICD-10-CM | POA: Diagnosis not present

## 2019-02-04 DIAGNOSIS — R32 Unspecified urinary incontinence: Secondary | ICD-10-CM | POA: Diagnosis not present

## 2019-02-04 DIAGNOSIS — M4317 Spondylolisthesis, lumbosacral region: Secondary | ICD-10-CM | POA: Diagnosis not present

## 2019-02-04 DIAGNOSIS — M5489 Other dorsalgia: Secondary | ICD-10-CM | POA: Diagnosis not present

## 2019-02-04 DIAGNOSIS — M546 Pain in thoracic spine: Secondary | ICD-10-CM | POA: Diagnosis not present

## 2019-02-04 MED ORDER — MORPHINE SULFATE (PF) 4 MG/ML IV SOLN: 4.0000 mg | Freq: Once | INTRAVENOUS | Status: DC

## 2019-02-04 MED ORDER — ONDANSETRON HCL 4 MG/2ML IJ SOLN
4.0000 mg | Freq: Once | INTRAMUSCULAR | Status: AC
  Administered 2019-02-04: 4 mg via INTRAVENOUS
  Filled 2019-02-04: qty 2

## 2019-02-04 MED ORDER — OXYCODONE-ACETAMINOPHEN 5-325 MG PO TABS: 1.0000 | ORAL_TABLET | Freq: Four times a day (QID) | ORAL | 0 refills | Status: DC | PRN

## 2019-02-04 MED ORDER — HYDROMORPHONE HCL 1 MG/ML IJ SOLN
0.5000 mg | INTRAMUSCULAR | Status: AC | PRN
  Administered 2019-02-04: 0.5 mg via INTRAVENOUS
  Filled 2019-02-04: qty 1

## 2019-02-04 MED ORDER — MORPHINE SULFATE (PF) 4 MG/ML IV SOLN
4.0000 mg | Freq: Once | INTRAVENOUS | Status: AC
  Administered 2019-02-04: 21:00:00 4 mg via INTRAMUSCULAR
  Filled 2019-02-04: qty 1

## 2019-02-04 MED ORDER — OXYCODONE HCL 5 MG PO TABS
5.0000 mg | ORAL_TABLET | Freq: Once | ORAL | Status: AC
  Administered 2019-02-04: 5 mg via ORAL
  Filled 2019-02-04: qty 1

## 2019-02-04 MED ORDER — ORPHENADRINE CITRATE 30 MG/ML IJ SOLN
60.0000 mg | Freq: Once | INTRAMUSCULAR | Status: AC
  Administered 2019-02-04: 60 mg via INTRAVENOUS
  Filled 2019-02-04: qty 2

## 2019-02-04 MED ORDER — SODIUM CHLORIDE 0.9 % IV BOLUS
1000.0000 mL | Freq: Once | INTRAVENOUS | Status: AC
  Administered 2019-02-04: 1000 mL via INTRAVENOUS

## 2019-02-04 MED ORDER — METHOCARBAMOL 500 MG PO TABS: 500.0000 mg | ORAL_TABLET | Freq: Four times a day (QID) | ORAL | 0 refills | Status: DC

## 2019-02-04 MED ORDER — PREDNISONE 10 MG PO TABS: 10.0000 mg | ORAL_TABLET | Freq: Every day | ORAL | 0 refills | Status: DC

## 2019-02-04 MED ORDER — DEXAMETHASONE SODIUM PHOSPHATE 10 MG/ML IJ SOLN
10.0000 mg | Freq: Once | INTRAMUSCULAR | Status: AC
  Administered 2019-02-04: 10 mg via INTRAVENOUS
  Filled 2019-02-04: qty 1

## 2019-02-04 NOTE — ED Notes (Signed)
Attempted to get pt up for discharge; was almost sitting up and said the pain became too unbearable and she fell back down onto her right side on the bed; rolled back flat on her back and wants to try again in about 10 minutes

## 2019-02-04 NOTE — ED Notes (Signed)
Patient transported to MRI 

## 2019-02-04 NOTE — ED Notes (Signed)
PA in to followup

## 2019-02-04 NOTE — ED Notes (Signed)
Pt using call bell; resting quietly in bed; given graham crackers and water as requested and allowed by PA

## 2019-02-04 NOTE — ED Provider Notes (Addendum)
[facility] EMERGENCY DEPARTMENT Provider Note   CSN: [id number] Arrival date & time: 02/04/19  1310    History   Chief Complaint Chief Complaint  Patient presents with  . Back Pain    HPI Jill Guzman is a 78 y.o. female.     Patient presents to the emergency department for treatment and evaluation of back pain.  She has been the sole caregiver of her son who had a brain tumor.  Hospice had been called to assist her, however due to coronavirus precautions they were not coming into homes.  Therefore, this left her take care of him alone.  He passed away recently.   She states that her back had been sore for some time, but 3 days ago she bent over to pick something up and felt something pop in her back.  Since that time she has been lying flat in the bed and has been unable to get up or down without assistance.  She states that she has had some loss of bladder control since the sudden increase in pain 3 days ago.  No relief with Tylenol or ibuprofen.       Past Medical History:  Diagnosis Date  . Anxiety   . Bell's palsy    age 27 and age 63   . COPD (chronic obstructive pulmonary disease) ([place])   . Diabetes mellitus without complication ([place])   . Hyperlipidemia   . Hypertension     Patient Active Problem List   Diagnosis Date Noted  . Recurrent major depressive disorder, in partial remission ([place]) 02/14/2018  . Chronic anxiety 02/09/2017  . AB (asthmatic bronchitis), mild intermittent, uncomplicated 36/14/4315  . Chronic obstructive pulmonary disease ([place]) 07/14/2016  . 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 Routine general medical examination at a health care facility 12/31/2014  . Diabetes ([place]) 12/31/2014  . BP (high blood pressure) 12/31/2014  . Osteopenia 12/31/2014    Past Surgical History:  Procedure Laterality Date  . ABDOMINAL  HYSTERECTOMY    . BREAST EXCISIONAL BIOPSY Left 1976   neg  . COLONOSCOPY  2014   normal  . cyst on bladder removed    . cyst removed breast Left   . PARTIAL HYSTERECTOMY       OB History   No obstetric history on file.      Home Medications    Prior to Admission medications   Medication Sig Start Date End Date Taking? Authorizing Provider  albuterol (PROVENTIL HFA;VENTOLIN HFA) 108 (90 Base) MCG/ACT inhaler Inhale 1 puff into the lungs QID. 02/14/18   Juline Patch, MD  amLODipine (NORVASC) 5 MG tablet Take 5 mg by mouth daily. 10/08/18   [provider]  atorvastatin (LIPITOR) 10 MG tablet Take 1 tablet by mouth once daily 12/27/18   Juline Patch, MD  Cholecalciferol (VITAMIN D) 50 MCG (2000 UT) CAPS Take by mouth.    [provider]  escitalopram (LEXAPRO) 10 MG tablet Take 1 tablet by mouth once daily 11/03/18   Otilio Miu C, MD  glucose blood (ACCU-CHEK AVIVA PLUS) test strip USE ONE STRIP TO CHECK GLUCOSE ONCE DAILY 02/14/18   Juline Patch, MD  losartan (COZAAR) 100 MG tablet Take 1 tablet (100 mg total) by mouth daily. 02/14/18   Juline Patch, MD  metFORMIN (GLUCOPHAGE) 500 MG tablet Take 1 tablet (500 mg total) by mouth 2 (two) times daily. 02/14/18  Juline Patch, MD  Omega 3 1000 MG CAPS Take 1 capsule (1,000 mg total) by mouth daily. 02/14/18   Juline Patch, MD  predniSONE (DELTASONE) 10 MG tablet Take 1 tablet (10 mg total) by mouth daily with breakfast. 02/03/19   Juline Patch, MD    Family History Family History  Problem Relation Age of Onset  . Diabetes Mother   . Stroke Mother   . Healthy Daughter   . Cancer Son 81       lung  . Hypertension Son   . Rheum arthritis Daughter   . Hypertension Daughter   . Arthritis Daughter   . COPD Son   . Breast cancer Neg Hx     Social History Social History   Tobacco Use  . Smoking status: Former Smoker    Packs/day: 2.00    Years: 10.00    Pack years: 20.00    Types: Cigarettes     Last attempt to quit: 2001    Years since quitting: 19.4  . Smokeless tobacco: Never Used  . Tobacco comment: Smoking cessation materials not required  Substance Use Topics  . Alcohol use: No    Alcohol/week: 0.0 standard drinks  . Drug use: No     Allergies   Penicillins and Sulfa antibiotics   Review of Systems Review of Systems   Physical Exam Updated Vital Signs BP (!) 159/98 (BP Location: Right Arm)   Pulse 86   Temp 98.1 F (36.7 C) (Oral)   Resp 18   Ht 5\' 3"  (1.6 m)   Wt 61.2 kg   SpO2 95%   BMI 23.91 kg/m   Physical Exam HENT:     Mouth/Throat:     Mouth: Mucous membranes are moist.  Neck:     Musculoskeletal: No neck rigidity.  Musculoskeletal:        General: No swelling.     Right shoulder: She exhibits pain.     Lumbar back: She exhibits decreased range of motion and tenderness.  Lymphadenopathy:     Cervical: No cervical adenopathy.  Skin:    General: Skin is warm and dry.     Capillary Refill: Capillary refill takes less than 2 seconds.  Neurological:     Mental Status: She is alert.     GCS: GCS eye subscore is 4. GCS verbal subscore is 5. GCS motor subscore is 6.     Cranial Nerves: Cranial nerves are intact.     Sensory: Sensation is intact.     Motor: Weakness present.  Psychiatric:        Mood and Affect: Mood normal.        Behavior: Behavior normal.        Thought Content: Thought content normal.      ED Treatments / Results  Labs (all labs ordered are listed, but only abnormal results are displayed) Labs Reviewed - No data to display  EKG None  Radiology Dg Lumbar Spine 2-3 Views  Result Date: 02/04/2019 CLINICAL DATA:  lifting injury about 3 weeks ago, had lower back pain after that. Twisting injury yesterday, now unable to stand or walk. No leg pain or sciatica. EXAM: LUMBAR SPINE - 2-3 VIEW COMPARISON:  Lumbar radiograph 02/24/2010 FINDINGS: Mild endplate spurring. No loss of disc space or vertebral body height.  Normal alignment of vertebral bodies. No subluxation. 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 IMPRESSION: 1. No acute findings lumbar spine. 2. Mild  disc osteophytic disease. 3. 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 Recommend non emergent CTA of the abdomen and pelvis for further evaluation. Electronically Signed   By: Suzy Bouchard M.D.   On: 02/04/2019 14:57    Procedures Procedures (including critical care time)  Medications Ordered in ED Medications  oxyCODONE (Oxy IR/ROXICODONE) immediate release tablet 5 mg (5 mg Oral Given 02/04/19 1529)     Initial Impression / Assessment and Plan / ED Course  I have reviewed the triage vital signs and the nursing notes.  Pertinent labs & imaging results that were available during my care of the patient were reviewed by me and considered in my medical decision making (see chart for details).        78 year old female presenting to the emergency department for acute onset of back pain with subsequent incontinence.  Initial images will be is diagnostic lumbar spine films, however I suspect that she will need an MRI.  Differential diagnosis includes but is not limited to back strain, cauda equina, spinal abscess, lumbar spine disc injury/protrusion.  ----------------------------------------- 3:44 PM on 02/04/2019 -----------------------------------------    X-ray of the lumbar spine was negative for acute findings.  MRI has been ordered. Care relinquished to J. Cuthriell, PA-C who will continue care and decide on disposition.  Final Clinical Impressions(s) / ED Diagnoses   Final diagnoses:  Acute lumbar back pain    ED Discharge Orders    None       Victorino Dike, FNP 02/04/19 1556    Delman Kitten, MD 02/04/19 [place], [place], FNP 02/20/19 1518    Delman Kitten, MD 02/20/19 2125

## 2019-02-04 NOTE — ED Triage Notes (Signed)
To ER via [facility] from home c/o lower back pain X 3 days, has been taking care of son at home and straining back. No known injury. Unable to walk due to pain. Hypertensive, other VSS. Pt alert and oriented X4, active, cooperative, pt in NAD. RR even and unlabored, color WNL.

## 2019-02-04 NOTE — ED Notes (Signed)
PA in to follow up

## 2019-02-04 NOTE — ED Notes (Signed)
Pt reports that she had been caring for her son that had a brain tumor and providing all of his care - per pt the lifting and pulling caused her to have back pain - then on Wednesday she bent over and felt a "pop" - since then she reports not being able to sit or stand without severe pain and reports that she is having to wear extra depends because when she lays down she is unable to control her bladder

## 2019-02-04 NOTE — ED Provider Notes (Addendum)
-----------------------------------------   7:53 PM on 02/04/2019 -----------------------------------------   Blood pressure (!) 159/98, pulse 86, temperature 98.1 F (36.7 C), temperature source Oral, resp. rate 18, height 5\' 3"  (1.6 m), weight 61.2 kg, SpO2 95 %.  Assuming care from Jill Guzman, [place].  In short, Jill Guzman is a 78 y.o. female with a chief complaint of Back Pain .  Refer to the original H&P for additional details.  The current plan of care is to await MRI results.  See original note for complete HPI and physical exam.  Patient care was turned over to myself pending MRI results.  Patient apparently has several days history of lower back pain, urinary incontinence after feeling a pop in her back while lifting an item.  Concern for herniated disc/central cord impingement existed.  MRI lumbar spine without contrast. FINDINGS: Segmentation: 5 lumbar type vertebrae  Alignment: Borderline anterolisthesis at L5-S1  Vertebrae: No fracture, evidence of discitis, or bone lesion.  Conus medullaris and cauda equina: Conus extends to the L2-3 level, low normal. No evidence of tethering mass.  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  Disc levels:  L2-L3: Mild disc narrowing and bulging. No impingement  L3-L4: Mild disc narrowing and bulging. Annular fissures. No impingement  L4-L5: Disc narrowing and bulging with annular fissures. Mild facet spurring. No impingement  L5-S1:Disc narrowing and bulging with an upward central protrusion. Annular fissures. No impingement  IMPRESSION: 1. No acute finding or neural impingement. 2. 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 3. 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  No acute central cord impingement.  At this time, MRI is reassuring in regards to lumbar  spine.  Patient will be treated symptomatically with pain medication, muscle relaxer, steroids for relief.  Patient does have an incidental finding of 3.8 distal aortic aneurysm.  At this time, patient has asymptomatic from this finding, does not warrant emergent evaluation by vascular.  Patient will be referred to vascular surgery for follow-up.  Patient is referred to neurosurgery for follow-up of lower back pain.  ED diagnosis Acute Low Back Pain     Addendum: Patient presented complaining of low back pain.  Patient had MRI which was reassuring.  On exam, patient has good sensation, good strength bilateral lower extremities.  Patient was up for discharge but every time she attempted to stand up, pain was so severe she cannot stand on her own.  Patient was reevaluated by myself.  At this time, given the amount of admissions pending, it was determined that the patient would be boarded overnight with steroids, pain medication and reattempt at discharge in the morning.  If patient is still symptomatic to the point she is unable to safely return home, hospitalist service will be consulted at that time.  Patient is agreeable to the plan.  Patient reports that she does have family members that would likely be able to assist her in the morning.    Jill Moll, PA-C 02/04/19 2051    Jill Moll, PA-C 02/05/19 Jill Maze, MD 02/09/19 [phone]

## 2019-02-04 NOTE — ED Notes (Signed)
Pt returned from MRI - she had voided in pull up - pt assisted to change pull up at this time

## 2019-02-05 DIAGNOSIS — M545 Low back pain: Secondary | ICD-10-CM | POA: Diagnosis not present

## 2019-02-05 LAB — CBC WITH DIFFERENTIAL/PLATELET
Abs Immature Granulocytes: 0.06 10*3/uL (ref 0.00–0.07)
Basophils Absolute: 0.1 10*3/uL (ref 0.0–0.1)
Basophils Relative: 1 %
Eosinophils Absolute: 0.2 10*3/uL (ref 0.0–0.5)
Eosinophils Relative: 1 %
HCT: 42.3 % (ref 36.0–46.0)
Hemoglobin: 13.4 g/dL (ref 12.0–15.0)
Immature Granulocytes: 0 %
Lymphocytes Relative: 10 %
Lymphs Abs: 1.5 10*3/uL (ref 0.7–4.0)
MCH: 27.5 pg (ref 26.0–34.0)
MCHC: 31.7 g/dL (ref 30.0–36.0)
MCV: 86.7 fL (ref 80.0–100.0)
Monocytes Absolute: 0.6 10*3/uL (ref 0.1–1.0)
Monocytes Relative: 4 %
Neutro Abs: 12.3 10*3/uL — ABNORMAL HIGH (ref 1.7–7.7)
Neutrophils Relative %: 84 %
Platelets: 213 10*3/uL (ref 150–400)
RBC: 4.88 MIL/uL (ref 3.87–5.11)
RDW: 12.5 % (ref 11.5–15.5)
WBC: 14.8 10*3/uL — ABNORMAL HIGH (ref 4.0–10.5)
nRBC: 0 % (ref 0.0–0.2)

## 2019-02-05 LAB — COMPREHENSIVE METABOLIC PANEL
ALT: 13 U/L (ref 0–44)
AST: 11 U/L — ABNORMAL LOW (ref 15–41)
Albumin: 3.3 g/dL — ABNORMAL LOW (ref 3.5–5.0)
Alkaline Phosphatase: 44 U/L (ref 38–126)
Anion gap: 7 (ref 5–15)
BUN: 16 mg/dL (ref 8–23)
CO2: 23 mmol/L (ref 22–32)
Calcium: 7.9 mg/dL — ABNORMAL LOW (ref 8.9–10.3)
Chloride: 112 mmol/L — ABNORMAL HIGH (ref 98–111)
Creatinine, Ser: 0.47 mg/dL (ref 0.44–1.00)
GFR calc Af Amer: >60 mL/min (ref >60)
GFR calc non Af Amer: >60 mL/min (ref >60)
Glucose, Bld: 145 mg/dL — ABNORMAL HIGH (ref 70–99)
Potassium: 3.7 mmol/L (ref 3.5–5.1)
Sodium: 142 mmol/L (ref 135–145)
Total Bilirubin: 0.5 mg/dL (ref 0.3–1.2)
Total Protein: 5.7 g/dL — ABNORMAL LOW (ref 6.5–8.1)

## 2019-02-05 MED ORDER — MORPHINE SULFATE (PF) 4 MG/ML IV SOLN
4.0000 mg | Freq: Once | INTRAVENOUS | Status: AC
  Administered 2019-02-05: 4 mg via INTRAVENOUS
  Filled 2019-02-05: qty 1

## 2019-02-05 NOTE — ED Provider Notes (Signed)
Patient wakes up after having had the Dilaudid.  She still in some pain but she is able to walk with a walker.  We will give her a little bit more before she goes some morphine 4 mg IV.  Hopefully that will not make her go to sleep again.   Nena Polio, MD 02/05/19 [phone]

## 2019-02-05 NOTE — ED Notes (Signed)
PT ambulatory with assistance with some increase in pain.  Pt states she feels like should could go home at this time.  Pt provided additional dose of pain medications as discussed with and ordered by Dr. Cinda Quest.  Pt aware of prescriptions at her pharmacy and other d/c instructions.  Pt's daughter called to pick her up, awaiting transportation at this time.

## 2019-02-05 NOTE — ED Notes (Signed)
Moved pt to CPOD; pt says she is starting to feel the effects of her medication; speech heavy; answering questions appropriately; sats 87-88% on room air; pt placed on 2L via [place] and sats rebounded to 96-97%; report given to Wells Guiles, [place]

## 2019-02-05 NOTE — ED Notes (Signed)
Pt awake, alert and oriented.  VSS at this time.  Pt states pain at this time is tolerable, but is having concerns about ambulating.  Encouraged PT to attempt at this time.

## 2019-02-05 NOTE — ED Notes (Signed)
Report received from [place], RN, care of pt assumed at this time.  Pt resting quietly on ER stretcher, NAD noted, VSS.  Will continue to monitor.

## 2019-02-22 DIAGNOSIS — H353131 Nonexudative age-related macular degeneration, bilateral, early dry stage: Secondary | ICD-10-CM | POA: Diagnosis not present

## 2019-02-22 DIAGNOSIS — E119 Type 2 diabetes mellitus without complications: Secondary | ICD-10-CM | POA: Diagnosis not present

## 2019-02-22 LAB — HM DIABETES EYE EXAM

## 2019-02-23 ENCOUNTER — Ambulatory Visit
Admission: RE | Admit: 2019-02-23 | Discharge: 2019-02-23 | Disposition: A | Discharge status: Home / Self Care | Payer: Medicare Other | Source: Ambulatory Visit | Attending: Family Medicine | Admitting: Family Medicine

## 2019-02-23 ENCOUNTER — Other Ambulatory Visit: Payer: Self-pay

## 2019-02-23 DIAGNOSIS — Z1231 Encounter for screening mammogram for malignant neoplasm of breast: Secondary | ICD-10-CM

## 2019-02-27 DIAGNOSIS — M810 Age-related osteoporosis without current pathological fracture: Secondary | ICD-10-CM | POA: Diagnosis not present

## 2019-02-27 DIAGNOSIS — E1169 Type 2 diabetes mellitus with other specified complication: Secondary | ICD-10-CM | POA: Diagnosis not present

## 2019-02-27 DIAGNOSIS — E785 Hyperlipidemia, unspecified: Secondary | ICD-10-CM | POA: Diagnosis not present

## 2019-02-27 DIAGNOSIS — E119 Type 2 diabetes mellitus without complications: Secondary | ICD-10-CM | POA: Diagnosis not present

## 2019-02-28 ENCOUNTER — Other Ambulatory Visit: Payer: Self-pay | Admitting: Family Medicine

## 2019-02-28 DIAGNOSIS — F3341 Major depressive disorder, recurrent, in partial remission: Secondary | ICD-10-CM

## 2019-03-06 ENCOUNTER — Other Ambulatory Visit: Payer: Self-pay

## 2019-03-24 ENCOUNTER — Other Ambulatory Visit: Payer: Self-pay | Admitting: Family Medicine

## 2019-03-24 DIAGNOSIS — E119 Type 2 diabetes mellitus without complications: Secondary | ICD-10-CM

## 2019-03-27 DIAGNOSIS — H353131 Nonexudative age-related macular degeneration, bilateral, early dry stage: Secondary | ICD-10-CM | POA: Insufficient documentation

## 2019-03-29 ENCOUNTER — Encounter: Payer: Self-pay | Admitting: Family Medicine

## 2019-03-29 ENCOUNTER — Ambulatory Visit (INDEPENDENT_AMBULATORY_CARE_PROVIDER_SITE_OTHER): Payer: Medicare Other | Admitting: Family Medicine

## 2019-03-29 ENCOUNTER — Other Ambulatory Visit: Payer: Self-pay

## 2019-03-29 VITALS — BP 120/62 | HR 60 | Ht 63.0 in | Wt 133.0 lb

## 2019-03-29 DIAGNOSIS — J4520 Mild intermittent asthma, uncomplicated: Secondary | ICD-10-CM

## 2019-03-29 DIAGNOSIS — E7849 Other hyperlipidemia: Secondary | ICD-10-CM

## 2019-03-29 DIAGNOSIS — M810 Age-related osteoporosis without current pathological fracture: Secondary | ICD-10-CM

## 2019-03-29 DIAGNOSIS — E119 Type 2 diabetes mellitus without complications: Secondary | ICD-10-CM

## 2019-03-29 DIAGNOSIS — I10 Essential (primary) hypertension: Secondary | ICD-10-CM | POA: Diagnosis not present

## 2019-03-29 DIAGNOSIS — R5383 Other fatigue: Secondary | ICD-10-CM

## 2019-03-29 DIAGNOSIS — F3341 Major depressive disorder, recurrent, in partial remission: Secondary | ICD-10-CM | POA: Diagnosis not present

## 2019-03-29 MED ORDER — ALENDRONATE SODIUM 70 MG PO TABS: 70.0000 mg | ORAL_TABLET | ORAL | 3 refills | Status: DC

## 2019-03-29 MED ORDER — ESCITALOPRAM OXALATE 10 MG PO TABS: 10.0000 mg | ORAL_TABLET | Freq: Every day | ORAL | 1 refills | Status: DC

## 2019-03-29 MED ORDER — OMEGA 3 1000 MG PO CAPS: 1.0000 | ORAL_CAPSULE | Freq: Every day | ORAL | Status: DC

## 2019-03-29 MED ORDER — AMLODIPINE BESYLATE 2.5 MG PO TABS: 2.5000 mg | ORAL_TABLET | Freq: Every day | ORAL | 1 refills | Status: DC

## 2019-03-29 MED ORDER — ALBUTEROL SULFATE HFA 108 (90 BASE) MCG/ACT IN AERS: 1.0000 | INHALATION_SPRAY | Freq: Four times a day (QID) | RESPIRATORY_TRACT | 11 refills | Status: DC

## 2019-03-29 MED ORDER — LOSARTAN POTASSIUM 100 MG PO TABS: 100.0000 mg | ORAL_TABLET | Freq: Every day | ORAL | 1 refills | Status: DC

## 2019-03-29 MED ORDER — ATORVASTATIN CALCIUM 10 MG PO TABS: 10.0000 mg | ORAL_TABLET | Freq: Every day | ORAL | 1 refills | Status: DC

## 2019-03-29 NOTE — Progress Notes (Signed)
Date:  03/29/2019   Name:  Jill Guzman   DOB:  1941/03/04   MRN:  [id number]   Chief Complaint: Hyperlipidemia, Hypertension, Depression (phQ9=2), and Extremity Weakness (feel weak since I hurt my back- "picking up son")  Hyperlipidemia This is a chronic problem. The current episode started more than 1 year ago. The problem is controlled. Recent lipid tests were reviewed and are normal. She has no history of chronic renal disease, diabetes, hypothyroidism, liver disease, obesity or nephrotic syndrome. There are no known factors aggravating her hyperlipidemia. Pertinent negatives include no chest pain, focal sensory loss, focal weakness, leg pain, myalgias or shortness of breath. Current antihyperlipidemic treatment includes diet change and statins. The current treatment provides moderate improvement of lipids. There are no compliance problems.   Hypertension This is a chronic problem. The current episode started more than 1 year ago. The problem has been gradually improving since onset. The problem is controlled. Pertinent negatives include no anxiety, blurred vision, chest pain, headaches, malaise/fatigue, neck pain, orthopnea, palpitations, peripheral edema, PND, shortness of breath or sweats. There are no associated agents to hypertension. There are no known risk factors for coronary artery disease. Past treatments include calcium channel blockers and angiotensin blockers. The current treatment provides moderate improvement. There are no compliance problems.  There is no history of angina, kidney disease, CAD/MI, CVA, heart failure, left ventricular hypertrophy, PVD or retinopathy. Identifiable causes of hypertension include a thyroid problem. There is no history of chronic renal disease, a hypertension causing med or renovascular disease.  Depression        This is a chronic problem.  The current episode started more than 1 year ago.   The problem occurs intermittently.  The problem has been  waxing and waning since onset.  Associated symptoms include no decreased concentration, no fatigue, no helplessness, no hopelessness, does not have insomnia, not irritable, no restlessness, no decreased interest, no appetite change, no body aches, no myalgias, no headaches, no indigestion, not sad and no suicidal ideas.     The symptoms are aggravated by medication.  Past treatments include SSRIs - Selective serotonin reuptake inhibitors.  Compliance with treatment is good.  Previous treatment provided moderate relief.  Past medical history includes thyroid problem.     Pertinent negatives include no hypothyroidism and no anxiety. Extremity Weakness  The pain is present in the back. This is a new problem. The current episode started more than 1 month ago. There has been a history of trauma (heavy lifting). The problem occurs daily. The problem has been unchanged. The quality of the pain is described as aching. Pertinent negatives include no fever, inability to bear weight, itching, joint locking, joint swelling, limited range of motion, numbness, stiffness or tingling. The symptoms are aggravated by activity. She has tried rest for the symptoms. The treatment provided mild relief. There is no history of diabetes.  Thyroid Problem Presents for initial visit. Symptoms include cold intolerance, depressed mood and dry skin. Patient reports no anxiety, constipation, diaphoresis, diarrhea, fatigue, hair loss, heat intolerance, hoarse voice, leg swelling, menstrual problem, nail problem, palpitations, tremors, visual change, weight gain or weight loss. Symptom course: increasing fatigue. Her past medical history is significant for hyperlipidemia. There is no history of diabetes or heart failure.    Review of Systems  Constitutional: Negative.  Negative for appetite change, chills, diaphoresis, fatigue, fever, malaise/fatigue, unexpected weight change, weight gain and weight loss.  HENT: Negative for congestion,  ear discharge, ear pain, hoarse  voice, rhinorrhea, sinus pressure, sneezing and sore throat.   Eyes: Negative for blurred vision, photophobia, pain, discharge, redness and itching.  Respiratory: Negative for cough, shortness of breath, wheezing and stridor.   Cardiovascular: Negative for chest pain, palpitations, orthopnea and PND.  Gastrointestinal: Negative for abdominal pain, blood in stool, constipation, diarrhea, nausea and vomiting.  Endocrine: Positive for cold intolerance. Negative for heat intolerance, polydipsia, polyphagia and polyuria.  Genitourinary: Negative for dysuria, flank pain, frequency, hematuria, menstrual problem, pelvic pain, urgency, vaginal bleeding and vaginal discharge.  Musculoskeletal: Positive for extremity weakness. Negative for arthralgias, back pain, myalgias, neck pain and stiffness.  Skin: Negative for itching and rash.  Allergic/Immunologic: Negative for environmental allergies and food allergies.  Neurological: Negative for dizziness, tingling, tremors, focal weakness, weakness, light-headedness, numbness and headaches.  Hematological: Negative for adenopathy. Does not bruise/bleed easily.  Psychiatric/Behavioral: Positive for depression. Negative for decreased concentration, dysphoric mood and suicidal ideas. The patient is not nervous/anxious and does not have insomnia.     Patient Active Problem List   Diagnosis Date Noted  . Recurrent major depressive disorder, in partial remission ([place]) 02/14/2018  . Chronic anxiety 02/09/2017  . AB (asthmatic bronchitis), mild intermittent, uncomplicated 47/42/5956  . Chronic obstructive pulmonary disease ([place]) 07/14/2016  . 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 Routine general medical examination at a health care facility 12/31/2014  . Diabetes ([place]) 12/31/2014  . BP (high blood pressure) 12/31/2014  . Osteopenia  12/31/2014    Allergies  Allergen Reactions  . Penicillins   . Sulfa Antibiotics     Past Surgical History:  Procedure Laterality Date  . ABDOMINAL HYSTERECTOMY    . BREAST EXCISIONAL BIOPSY Left 1976   neg  . COLONOSCOPY  2014   normal  . cyst on bladder removed    . cyst removed breast Left   . PARTIAL HYSTERECTOMY      Social History   Tobacco Use  . Smoking status: Former Smoker    Packs/day: 2.00    Years: 10.00    Pack years: 20.00    Types: Cigarettes    Quit date: 2001    Years since quitting: 19.5  . Smokeless tobacco: Never Used  . Tobacco comment: Smoking cessation materials not required  Substance Use Topics  . Alcohol use: No    Alcohol/week: 0.0 standard drinks  . Drug use: No     Medication list has been reviewed and updated.  Current Meds  Medication Sig  . albuterol (PROVENTIL HFA;VENTOLIN HFA) 108 (90 Base) MCG/ACT inhaler Inhale 1 puff into the lungs QID.  Marland Kitchen alendronate (FOSAMAX) 70 MG tablet Take 70 mg by mouth once a week. Take with a full glass of water on an empty stomach.  Marland Kitchen amLODipine (NORVASC) 5 MG tablet Take 5 mg by mouth daily.  Marland Kitchen atorvastatin (LIPITOR) 10 MG tablet Take 1 tablet by mouth once daily  . Cholecalciferol (VITAMIN D) 50 MCG (2000 UT) CAPS Take by mouth.  . escitalopram (LEXAPRO) 10 MG tablet Take 1 tablet by mouth once daily  . glucose blood (ACCU-CHEK AVIVA PLUS) test strip USE ONE STRIP TO CHECK GLUCOSE ONCE DAILY  . losartan (COZAAR) 100 MG tablet Take 1 tablet (100 mg total) by mouth daily.  . metFORMIN (GLUCOPHAGE) 500 MG tablet Take 1 tablet (500 mg total) by mouth 2 (two) times daily. (Patient taking differently: Take 1,500 mg by mouth daily. 1 in the morning and 2  at bedtime- dr Honor Junes)  . Omega 3 1000 MG CAPS Take 1 capsule (1,000 mg total) by mouth daily.    PHQ 2/9 Scores 03/29/2019 10/10/2018 09/01/2018 02/14/2018  PHQ - 2 Score 0 2 2 0  PHQ- 9 Score 2 5 10 1     BP Readings from Last 3 Encounters:   03/29/19 120/62  02/05/19 (!) 142/71  10/10/18 (!) 148/82    Physical Exam Vitals signs and nursing note reviewed.  Constitutional:      General: She is not irritable.    Appearance: She is well-developed.  HENT:     Head: Normocephalic.     Right Ear: Tympanic membrane, ear canal and external ear normal.     Left Ear: Tympanic membrane, ear canal and external ear normal.  Eyes:     General: Lids are everted, no foreign bodies appreciated. No scleral icterus.       Left eye: No foreign body or hordeolum.     Conjunctiva/sclera: Conjunctivae normal.     Right eye: Right conjunctiva is not injected.     Left eye: Left conjunctiva is not injected.     Pupils: Pupils are equal, round, and reactive to light.  Neck:     Musculoskeletal: Normal range of motion and neck supple.     Thyroid: No thyromegaly.     Vascular: No JVD.     Trachea: No tracheal deviation.  Cardiovascular:     Rate and Rhythm: Normal rate and regular rhythm.     Pulses: Normal pulses.     Heart sounds: Normal heart sounds. No murmur. No friction rub. No gallop.   Pulmonary:     Effort: Pulmonary effort is normal. No respiratory distress.     Breath sounds: Normal breath sounds. No wheezing or rales.  Abdominal:     General: Bowel sounds are normal.     Palpations: Abdomen is soft. There is no mass.     Tenderness: There is no abdominal tenderness. There is no guarding or rebound.  Musculoskeletal: Normal range of motion.        General: No tenderness.  Lymphadenopathy:     Cervical: No cervical adenopathy.  Skin:    General: Skin is warm.     Capillary Refill: Capillary refill takes less than 2 seconds.     Findings: No rash.  Neurological:     Mental Status: She is alert and oriented to person, place, and time.     Cranial Nerves: No cranial nerve deficit.     Deep Tendon Reflexes: Reflexes normal.  Psychiatric:        Mood and Affect: Mood is not anxious or depressed.     Wt Readings from Last  3 Encounters:  03/29/19 133 lb (60.3 kg)  02/04/19 135 lb (61.2 kg)  10/10/18 138 lb 12.8 oz (63 kg)    BP 120/62   Pulse 60   Ht 5\' 3"  (1.6 m)   Wt 133 lb (60.3 kg)   BMI 23.56 kg/m   Assessment and Plan:  1. AB (asthmatic bronchitis), mild intermittent, uncomplicated Patient with history of mild intermittent asthma which is controlled on albuterol 1 to 2 puffs every 6 hours. - albuterol (VENTOLIN HFA) 108 (90 Base) MCG/ACT inhaler; Inhale 1 puff into the lungs QID.  Dispense: 6.7 g; Refill: 11  2. Essential hypertension Chronic.  Controlled.  Continue losartan 100 mg once a day and will decrease amlodipine 2.5 mg once a day because patient feels that she has had  some fatigue on current medication dosing.  Will check GFR and creatinine with renal function panel. - losartan (COZAAR) 100 MG tablet; Take 1 tablet (100 mg total) by mouth daily.  Dispense: 90 tablet; Refill: 1 - amLODipine (NORVASC) 2.5 MG tablet; Take 1 tablet (2.5 mg total) by mouth daily.  Dispense: 90 tablet; Refill: 1 - Renal Function Panel  3. Familial multiple lipoprotein-type hyperlipidemia Chronic.  Controlled.  Continue atorvastatin 10 mg once a day.  We will also continue omega-3 on a daily basis we will check lipid panel. - atorvastatin (LIPITOR) 10 MG tablet; Take 1 tablet (10 mg total) by mouth daily.  Dispense: 90 tablet; Refill: 1 - Omega 3 1000 MG CAPS; Take 1 capsule (1,000 mg total) by mouth daily. - Lipid panel  4. Recurrent major depressive disorder, in partial remission ([facility]) Chronic.  Controlled.  Continue Lexapro 10 mg once a day. - escitalopram (LEXAPRO) 10 MG tablet; Take 1 tablet (10 mg total) by mouth daily.  Dispense: 90 tablet; Refill: 1  5. Type 2 diabetes mellitus without complication, without long-term current use of insulin ([place]) Patient is followed by endocrinology for diabetes.  6. Age related osteoporosis, unspecified pathological fracture presence Patient is on Fosamax for DEXA  scan confirmed osteoporosis.  Will continue Fosamax 70 mg once a week.  Will probably stop after 2 to 3 years and continue with vitamin D and calcium. - alendronate (FOSAMAX) 70 MG tablet; Take 1 tablet (70 mg total) by mouth once a week. Take with a full glass of water on an empty stomach.  Dispense: 13 tablet; Refill: 3  7. Fatigue, unspecified type This is new onset fatigue which patient is attributing may be due to medications we reviewed for steps were going to do is to decrease her amlodipine to 2.5 mg.  Patient will continue her losartan because of her diabetes and the nephrotoxicity protection of her medication.  Will initiate a fatigue work-up by looking at her TSH anc d cbc..  Patient is probably under a lot of duress following the passing of her son due to a brain malignancy. - TSH - CBC with Differential/Platelet

## 2019-03-30 LAB — CBC WITH DIFFERENTIAL/PLATELET
Basophils Absolute: 0.1 10*3/uL (ref 0.0–0.2)
Basos: 1 %
EOS (ABSOLUTE): 0.2 10*3/uL (ref 0.0–0.4)
Eos: 3 %
Hematocrit: 42.9 % (ref 34.0–46.6)
Hemoglobin: 14.1 g/dL (ref 11.1–15.9)
Immature Grans (Abs): 0 10*3/uL (ref 0.0–0.1)
Immature Granulocytes: 0 %
Lymphocytes Absolute: 2.4 10*3/uL (ref 0.7–3.1)
Lymphs: 28 %
MCH: 28 pg (ref 26.6–33.0)
MCHC: 32.9 g/dL (ref 31.5–35.7)
MCV: 85 fL (ref 79–97)
Monocytes Absolute: 0.8 10*3/uL (ref 0.1–0.9)
Monocytes: 9 %
Neutrophils Absolute: 4.9 10*3/uL (ref 1.4–7.0)
Neutrophils: 59 %
Platelets: 225 10*3/uL (ref 150–450)
RBC: 5.04 x10E6/uL (ref 3.77–5.28)
RDW: 12.8 % (ref 11.7–15.4)
WBC: 8.3 10*3/uL (ref 3.4–10.8)

## 2019-03-30 LAB — LIPID PANEL
Chol/HDL Ratio: 2.9 ratio (ref 0.0–4.4)
Cholesterol, Total: 159 mg/dL (ref 100–199)
HDL: 55 mg/dL (ref >39)
LDL Calculated: 86 mg/dL (ref 0–99)
Triglycerides: 89 mg/dL (ref 0–149)
VLDL Cholesterol Cal: 18 mg/dL (ref 5–40)

## 2019-03-30 LAB — RENAL FUNCTION PANEL
Albumin: 4.7 g/dL (ref 3.7–4.7)
BUN/Creatinine Ratio: 24 (ref 12–28)
BUN: 15 mg/dL (ref 8–27)
CO2: 25 mmol/L (ref 20–29)
Calcium: 10.4 mg/dL — ABNORMAL HIGH (ref 8.7–10.3)
Chloride: 102 mmol/L (ref 96–106)
Creatinine, Ser: 0.63 mg/dL (ref 0.57–1.00)
GFR calc Af Amer: 101 mL/min/{1.73_m2} (ref >59)
GFR calc non Af Amer: 87 mL/min/{1.73_m2} (ref >59)
Glucose: 131 mg/dL — ABNORMAL HIGH (ref 65–99)
Phosphorus: 3.1 mg/dL (ref 3.0–4.3)
Potassium: 4.5 mmol/L (ref 3.5–5.2)
Sodium: 141 mmol/L (ref 134–144)

## 2019-03-30 LAB — TSH: TSH: 1.65 u[IU]/mL (ref 0.450–4.500)

## 2019-06-02 DIAGNOSIS — E1169 Type 2 diabetes mellitus with other specified complication: Secondary | ICD-10-CM | POA: Diagnosis not present

## 2019-06-02 DIAGNOSIS — E785 Hyperlipidemia, unspecified: Secondary | ICD-10-CM | POA: Diagnosis not present

## 2019-06-02 DIAGNOSIS — E119 Type 2 diabetes mellitus without complications: Secondary | ICD-10-CM | POA: Diagnosis not present

## 2019-06-02 DIAGNOSIS — M810 Age-related osteoporosis without current pathological fracture: Secondary | ICD-10-CM | POA: Diagnosis not present

## 2019-06-02 LAB — HEMOGLOBIN A1C: Hemoglobin A1C: 6.2

## 2019-06-16 DIAGNOSIS — Z23 Encounter for immunization: Secondary | ICD-10-CM | POA: Diagnosis not present

## 2019-07-07 DIAGNOSIS — M810 Age-related osteoporosis without current pathological fracture: Secondary | ICD-10-CM | POA: Diagnosis not present

## 2019-08-23 ENCOUNTER — Ambulatory Visit: Payer: Self-pay | Admitting: Family Medicine

## 2019-09-04 ENCOUNTER — Encounter: Payer: Self-pay | Admitting: Family Medicine

## 2019-09-04 ENCOUNTER — Ambulatory Visit (INDEPENDENT_AMBULATORY_CARE_PROVIDER_SITE_OTHER): Payer: Medicare Other | Admitting: Family Medicine

## 2019-09-04 ENCOUNTER — Other Ambulatory Visit: Payer: Self-pay

## 2019-09-04 VITALS — BP 140/88 | HR 68 | Ht 63.0 in | Wt 133.0 lb

## 2019-09-04 DIAGNOSIS — F3341 Major depressive disorder, recurrent, in partial remission: Secondary | ICD-10-CM

## 2019-09-04 DIAGNOSIS — S83412A Sprain of medial collateral ligament of left knee, initial encounter: Secondary | ICD-10-CM

## 2019-09-04 DIAGNOSIS — F5102 Adjustment insomnia: Secondary | ICD-10-CM | POA: Diagnosis not present

## 2019-09-04 DIAGNOSIS — F419 Anxiety disorder, unspecified: Secondary | ICD-10-CM | POA: Diagnosis not present

## 2019-09-04 MED ORDER — ESCITALOPRAM OXALATE 20 MG PO TABS: 20.0000 mg | ORAL_TABLET | Freq: Every day | ORAL | 0 refills | Status: DC

## 2019-09-04 MED ORDER — MELOXICAM 7.5 MG PO TABS: 7.5000 mg | ORAL_TABLET | Freq: Every day | ORAL | 0 refills | Status: DC

## 2019-09-04 MED ORDER — TRAZODONE HCL 50 MG PO TABS: 25.0000 mg | ORAL_TABLET | Freq: Every evening | ORAL | 3 refills | Status: DC | PRN

## 2019-09-04 NOTE — Progress Notes (Addendum)
Date:  09/04/2019   Name:  Jill Guzman   DOB:  05-29-1941   MRN:  [id number]   Chief Complaint: Depression (change from lexapro to wellbutrin PHQ9=10 and GAD7=5) and Knee Pain (L) knee pain- started out door and dog hit leg. 75 pound dog. Feels like it is stinging and has been swollen and painful)  Depression        This is a chronic problem.  The current episode started more than 1 year ago.   The onset quality is gradual.   The problem has been waxing and waning since onset.  Associated symptoms include helplessness, hopelessness, insomnia, irritable, decreased interest and sad.  Associated symptoms include no decreased concentration, no fatigue, no restlessness, no appetite change, no body aches, no myalgias, no headaches, no indigestion and no suicidal ideas.  Past treatments include SSRIs - Selective serotonin reuptake inhibitors.  Previous treatment provided moderate relief.  Terminal illness: insom.   Knee Pain  The incident occurred more than 1 week ago. The incident occurred at home. Injury mechanism: [place] ran into knee/Valgus stress. The pain is present in the left knee. The pain is at a severity of 7/10. The pain is moderate. The pain has been intermittent since onset. Pertinent negatives include no inability to bear weight, loss of motion, loss of sensation, muscle weakness, numbness or tingling. The symptoms are aggravated by movement and weight bearing. She has tried acetaminophen and NSAIDs for the symptoms.  Insomnia PMH includes: depression.    Lab Results  Component Value Date   CREATININE 0.63 03/29/2019   BUN 15 03/29/2019   NA 141 03/29/2019   K 4.5 03/29/2019   CL 102 03/29/2019   CO2 25 03/29/2019   Lab Results  Component Value Date   CHOL 159 03/29/2019   HDL 55 03/29/2019   LDLCALC 86 03/29/2019   TRIG 89 03/29/2019   CHOLHDL 2.9 03/29/2019   Lab Results  Component Value Date   TSH 1.650 03/29/2019   Lab Results  Component Value Date     HGBA1C 6.2 06/02/2019     Review of Systems  Constitutional: Negative.  Negative for appetite change, chills, fatigue, fever and unexpected weight change.  HENT: Negative for congestion, ear discharge, ear pain, rhinorrhea, sinus pressure, sneezing and sore throat.   Eyes: Negative for photophobia, pain, discharge, redness and itching.  Respiratory: Negative for cough, shortness of breath, wheezing and stridor.   Gastrointestinal: Negative for abdominal pain, blood in stool, constipation, diarrhea, nausea and vomiting.  Endocrine: Negative for cold intolerance, heat intolerance, polydipsia, polyphagia and polyuria.  Genitourinary: Negative for dysuria, flank pain, frequency, hematuria, menstrual problem, pelvic pain, urgency, vaginal bleeding and vaginal discharge.  Musculoskeletal: Negative for arthralgias, back pain and myalgias.  Skin: Negative for rash.  Allergic/Immunologic: Negative for environmental allergies and food allergies.  Neurological: Negative for dizziness, tingling, weakness, light-headedness, numbness and headaches.  Hematological: Negative for adenopathy. Does not bruise/bleed easily.  Psychiatric/Behavioral: Positive for depression. Negative for decreased concentration, dysphoric mood and suicidal ideas. The patient has insomnia. The patient is not nervous/anxious.     Patient Active Problem List   Diagnosis Date Noted  . Nonexudative age-related macular degeneration, bilateral, early dry stage 03/27/2019  . Recurrent major depressive disorder, in partial remission ([place]) 02/14/2018  . Chronic anxiety 02/09/2017  . AB (asthmatic bronchitis), mild intermittent, uncomplicated 88/07/314  . Chronic obstructive pulmonary disease ([place]) 07/14/2016  . 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 Routine general medical examination at a health care facility 12/31/2014  . Diabetes  ([place]) 12/31/2014  . BP (high blood pressure) 12/31/2014  . Osteopenia 12/31/2014    Allergies  Allergen Reactions  . Penicillins   . Sulfa Antibiotics     Past Surgical History:  Procedure Laterality Date  . ABDOMINAL HYSTERECTOMY    . BREAST EXCISIONAL BIOPSY Left 1976   neg  . COLONOSCOPY  2014   normal  . cyst on bladder removed    . cyst removed breast Left   . PARTIAL HYSTERECTOMY      Social History   Tobacco Use  . Smoking status: Former Smoker    Packs/day: 2.00    Years: 10.00    Pack years: 20.00    Types: Cigarettes    Quit date: 2001    Years since quitting: 20.0  . Smokeless tobacco: Never Used  . Tobacco comment: Smoking cessation materials not required  Substance Use Topics  . Alcohol use: No    Alcohol/week: 0.0 standard drinks  . Drug use: No     Medication list has been reviewed and updated.  Current Meds  Medication Sig  . albuterol (VENTOLIN HFA) 108 (90 Base) MCG/ACT inhaler Inhale 1 puff into the lungs QID.  Marland Kitchen amLODipine (NORVASC) 2.5 MG tablet Take 1 tablet (2.5 mg total) by mouth daily. (Patient taking differently: Take 5 mg by mouth daily. )  . atorvastatin (LIPITOR) 10 MG tablet Take 1 tablet (10 mg total) by mouth daily.  . Cholecalciferol (VITAMIN D) 50 MCG (2000 UT) CAPS Take by mouth.  . escitalopram (LEXAPRO) 10 MG tablet Take 1 tablet (10 mg total) by mouth daily.  Marland Kitchen glucose blood (ACCU-CHEK AVIVA PLUS) test strip USE ONE STRIP TO CHECK GLUCOSE ONCE DAILY  . losartan (COZAAR) 100 MG tablet Take 1 tablet (100 mg total) by mouth daily.  . metFORMIN (GLUCOPHAGE) 500 MG tablet Take 1 tablet (500 mg total) by mouth 2 (two) times daily. (Patient taking differently: Take 500 mg by mouth 2 (two) times daily with a meal. I tab bid)  . Omega 3 1000 MG CAPS Take 1 capsule (1,000 mg total) by mouth daily.    PHQ 2/9 Scores 09/04/2019 03/29/2019 10/10/2018 09/01/2018  PHQ - 2 Score 4 0 2 2  PHQ- 9 Score 10 2 5 10     BP Readings from Last 3  Encounters:  09/04/19 140/88  03/29/19 120/62  02/05/19 (!) 142/71    Physical Exam Vitals and nursing note reviewed.  Constitutional:      General: She is irritable.     Appearance: She is well-developed.  HENT:     Head: Normocephalic.     Right Ear: External ear normal.     Left Ear: External ear normal.  Eyes:     General: Lids are everted, no foreign bodies appreciated. No scleral icterus.       Left eye: No foreign body or hordeolum.     Conjunctiva/sclera: Conjunctivae normal.     Right eye: Right conjunctiva is not injected.     Left eye: Left conjunctiva is not injected.     Pupils: Pupils are equal, round, and reactive to light.  Neck:     Thyroid: No thyromegaly.     Vascular: No JVD.     Trachea: No tracheal deviation.  Cardiovascular:     Rate and Rhythm: Normal rate and regular rhythm.     Chest Wall: PMI is  not displaced.     Heart sounds: Normal heart sounds, S1 normal and S2 normal. No murmur. No systolic murmur. No diastolic murmur. No friction rub. No gallop. No S3 or S4 sounds.   Pulmonary:     Effort: Pulmonary effort is normal. No respiratory distress.     Breath sounds: Normal breath sounds. No wheezing or rales.  Abdominal:     General: Bowel sounds are normal.     Palpations: Abdomen is soft. There is no mass.     Tenderness: There is no abdominal tenderness. There is no guarding or rebound.  Musculoskeletal:        General: Normal range of motion.     Cervical back: Normal range of motion and neck supple.     Left knee: Swelling and bony tenderness (tender origin MCL) present. Normal range of motion. Tenderness present over the medial joint line and MCL.     Right lower leg: No edema.     Left lower leg: No edema.     Comments: Pain with valgus stress  Lymphadenopathy:     Cervical: No cervical adenopathy.  Skin:    General: Skin is warm.     Findings: No rash.  Neurological:     Mental Status: She is alert and oriented to person, place, and  time.     Cranial Nerves: No cranial nerve deficit.     Deep Tendon Reflexes: Reflexes normal.  Psychiatric:        Mood and Affect: Mood is not anxious or depressed.     Wt Readings from Last 3 Encounters:  09/04/19 133 lb (60.3 kg)  03/29/19 133 lb (60.3 kg)  02/04/19 135 lb (61.2 kg)    BP 140/88   Pulse 68   Ht 5\' 3"  (1.6 m)   Wt 133 lb (60.3 kg)   BMI 23.56 kg/m   As sessment and Plan:  1. Sprain of medial collateral ligament of left knee, initial encounter It seems like this is been the season for big dogs causing significant orthopedic concerns.  This time a 75 pound [place] saw aerobic leaf and took off after running into the knee and putting valgus stress creating pain over the past 6 weeks of the patient's medial collateral ligament.  Initially there was swelling as noted by the patient's photographs as well as decreased range of motion.  Patient has decreased swelling but is continued to have exquisite tenderness of her MCL and pain with valgus stress.  We will initiate meloxicam 7.5 mg once a day and patient has been instructed that she may take Tylenol on a as needed basis but no additional NSAID.  Patient has been referred to orthopedics for evaluation. - meloxicam (MOBIC) 7.5 MG tablet; Take 1 tablet (7.5 mg total) by mouth daily.  Dispense: 30 tablet; Refill: 0 - Ambulatory referral to Orthopedic Surgery  2. Chronic anxiety PHQ score is 10 with a gad score of 5.  In addition to anxiety patient has also had depression given her recent grieving of the concern of the previous year.  This was discussed with the patient and she agrees to increasing her Lexapro rather than addition of her Wellbutrin at this time.  This will maximize her [place] and we will recheck in 6 weeks.  In the meantime we will also address the patient's insomnia - escitalopram (LEXAPRO) 20 MG tablet; Take 1 tablet (20 mg total) by mouth daily.  Dispense: 90 tablet; Refill: 0  3. Recurrent major  depressive disorder, in partial remission ([place]) As noted above patient has a 10 on her PHQ score which has not reached the level of stability on current dosing of S-Citalopram 10 mg.  We will initiate to 20 mg Lexapro and evaluate in 6 weeks.  We have discussed the possibility of addition or replacement with Wellbutrin should the patient not have significant results. - escitalopram (LEXAPRO) 20 MG tablet; Take 1 tablet (20 mg total) by mouth daily.  Dispense: 90 tablet; Refill: 0  4. Adjustment insomnia New onset.  Patient's had difficulty with insomnia and this is also associated adjustment concern.  We will initiate trazodone 50 mg 1/2 tablet 25 mg needed for sleep and will recheck results in 6 weeks. - traZODone (DESYREL) 50 MG tablet; Take 0.5-1 tablets (25-50 mg total) by mouth at bedtime as needed for sleep.  Dispense: 30 tablet; Refill: 3

## 2019-09-12 ENCOUNTER — Other Ambulatory Visit: Payer: Self-pay | Admitting: Orthopedic Surgery

## 2019-09-12 DIAGNOSIS — M2392 Unspecified internal derangement of left knee: Secondary | ICD-10-CM

## 2019-09-12 DIAGNOSIS — S8392XA Sprain of unspecified site of left knee, initial encounter: Secondary | ICD-10-CM

## 2019-09-12 DIAGNOSIS — E119 Type 2 diabetes mellitus without complications: Secondary | ICD-10-CM | POA: Diagnosis not present

## 2019-09-12 DIAGNOSIS — M25562 Pain in left knee: Secondary | ICD-10-CM | POA: Diagnosis not present

## 2019-09-20 ENCOUNTER — Other Ambulatory Visit: Payer: Self-pay

## 2019-09-20 ENCOUNTER — Ambulatory Visit
Admission: RE | Admit: 2019-09-20 | Discharge: 2019-09-20 | Disposition: A | Discharge status: Home / Self Care | Payer: Medicare Other | Source: Ambulatory Visit | Attending: Orthopedic Surgery | Admitting: Orthopedic Surgery

## 2019-09-20 DIAGNOSIS — M25562 Pain in left knee: Secondary | ICD-10-CM

## 2019-09-20 DIAGNOSIS — M2392 Unspecified internal derangement of left knee: Secondary | ICD-10-CM | POA: Diagnosis not present

## 2019-09-20 DIAGNOSIS — S8392XA Sprain of unspecified site of left knee, initial encounter: Secondary | ICD-10-CM

## 2019-10-02 DIAGNOSIS — E119 Type 2 diabetes mellitus without complications: Secondary | ICD-10-CM | POA: Diagnosis not present

## 2019-10-02 DIAGNOSIS — M810 Age-related osteoporosis without current pathological fracture: Secondary | ICD-10-CM | POA: Diagnosis not present

## 2019-10-02 DIAGNOSIS — E1169 Type 2 diabetes mellitus with other specified complication: Secondary | ICD-10-CM | POA: Diagnosis not present

## 2019-10-02 DIAGNOSIS — E785 Hyperlipidemia, unspecified: Secondary | ICD-10-CM | POA: Diagnosis not present

## 2019-10-09 LAB — HEMOGLOBIN A1C: Hemoglobin A1C: 6.4

## 2019-10-12 ENCOUNTER — Other Ambulatory Visit: Payer: Self-pay | Admitting: Orthopedic Surgery

## 2019-10-12 DIAGNOSIS — M2392 Unspecified internal derangement of left knee: Secondary | ICD-10-CM | POA: Diagnosis not present

## 2019-10-12 DIAGNOSIS — S83232A Complex tear of medial meniscus, current injury, left knee, initial encounter: Secondary | ICD-10-CM | POA: Diagnosis not present

## 2019-10-16 ENCOUNTER — Encounter: Payer: Self-pay | Admitting: Orthopedic Surgery

## 2019-10-16 ENCOUNTER — Other Ambulatory Visit: Payer: Self-pay

## 2019-10-16 ENCOUNTER — Ambulatory Visit (INDEPENDENT_AMBULATORY_CARE_PROVIDER_SITE_OTHER): Payer: Medicare Other

## 2019-10-16 VITALS — BP 126/77 | Temp 95.9°F | Ht 63.0 in | Wt 133.0 lb

## 2019-10-16 DIAGNOSIS — Z0000 Encounter for general adult medical examination without abnormal findings: Secondary | ICD-10-CM

## 2019-10-16 NOTE — Patient Instructions (Signed)
Jill Guzman , Thank you for taking time to come for your Medicare Wellness Visit. I appreciate your ongoing commitment to your health goals. Please review the following plan we discussed and let me know if I can assist you in the future.   Screening recommendations/referrals: Colonoscopy: no longer required  Mammogram: done 02/23/19 Bone Density: due this year, ordered by endocrinology Recommended yearly ophthalmology/optometry visit for glaucoma screening and checkup Recommended yearly dental visit for hygiene and checkup  Vaccinations: Influenza vaccine: done 07/09/19 Pneumococcal vaccine: done 06/22/19 Tdap vaccine: done 09/20/18 Shingles vaccine: Need to complete second dose of Shingrix.    Advanced directives: Please bring a copy of your health care power of attorney and living will to the office at your convenience.  Conditions/risks identified: Recommend increasing physical activity as tolerated after knee surgery.   Next appointment: Please follow up in one year for your Medicare Annual Wellness visit.     Preventive Care 35 Years and Older, Female Preventive care refers to lifestyle choices and visits with your health care provider that can promote health and wellness. What does preventive care include?  A yearly physical exam. This is also called an annual well check.  Dental exams once or twice a year.  Routine eye exams. Ask your health care provider how often you should have your eyes checked.  Personal lifestyle choices, including:  Daily care of your teeth and gums.  Regular physical activity.  Eating a healthy diet.  Avoiding tobacco and drug use.  Limiting alcohol use.  Practicing safe sex.  Taking low-dose aspirin every day.  Taking vitamin and mineral supplements as recommended by your health care provider. What happens during an annual well check? The services and screenings done by your health care provider during your annual well check will  depend on your age, overall health, lifestyle risk factors, and family history of disease. Counseling  Your health care provider may ask you questions about your:  Alcohol use.  Tobacco use.  Drug use.  Emotional well-being.  Home and relationship well-being.  Sexual activity.  Eating habits.  History of falls.  Memory and ability to understand (cognition).  Work and work Statistician.  Reproductive health. Screening  You may have the following tests or measurements:  Height, weight, and BMI.  Blood pressure.  Lipid and cholesterol levels. These may be checked every 5 years, or more frequently if you are over 25 years old.  Skin check.  Lung cancer screening. You may have this screening every year starting at age 23 if you have a 30-pack-year history of smoking and currently smoke or have quit within the past 15 years.  Fecal occult blood test (FOBT) of the stool. You may have this test every year starting at age 38.  Flexible sigmoidoscopy or colonoscopy. You may have a sigmoidoscopy every 5 years or a colonoscopy every 10 years starting at age 16.  Hepatitis C blood test.  Hepatitis B blood test.  Sexually transmitted disease (STD) testing.  Diabetes screening. This is done by checking your blood sugar (glucose) after you have not eaten for a while (fasting). You may have this done every 1-3 years.  Bone density scan. This is done to screen for osteoporosis. You may have this done starting at age 58.  Mammogram. This may be done every 1-2 years. Talk to your health care provider about how often you should have regular mammograms. Talk with your health care provider about your test results, treatment options, and if necessary, the  need for more tests. Vaccines  Your health care provider may recommend certain vaccines, such as:  Influenza vaccine. This is recommended every year.  Tetanus, diphtheria, and acellular pertussis (Tdap, Td) vaccine. You may need a  Td booster every 10 years.  Zoster vaccine. You may need this after age 45.  Pneumococcal 13-valent conjugate (PCV13) vaccine. One dose is recommended after age 93.  Pneumococcal polysaccharide (PPSV23) vaccine. One dose is recommended after age 60. Talk to your health care provider about which screenings and vaccines you need and how often you need them. This information is not intended to replace advice given to you by your health care provider. Make sure you discuss any questions you have with your health care provider. Document Released: 09/20/2015 Document Revised: 05/13/2016 Document Reviewed: 06/25/2015 Elsevier Interactive Patient Education  2017 [employer] Prevention in the Home Falls can cause injuries. They can happen to people of all ages. There are many things you can do to make your home safe and to help prevent falls. What can I do on the outside of my home?  Regularly fix the edges of walkways and driveways and fix any cracks.  Remove anything that might make you trip as you walk through a door, such as a raised step or threshold.  Trim any bushes or trees on the path to your home.  Use bright outdoor lighting.  Clear any walking paths of anything that might make someone trip, such as rocks or tools.  Regularly check to see if handrails are loose or broken. Make sure that both sides of any steps have handrails.  Any raised decks and porches should have guardrails on the edges.  Have any leaves, snow, or ice cleared regularly.  Use sand or salt on walking paths during winter.  Clean up any spills in your garage right away. This includes oil or grease spills. What can I do in the bathroom?  Use night lights.  Install grab bars by the toilet and in the tub and shower. Do not use towel bars as grab bars.  Use non-skid mats or decals in the tub or shower.  If you need to sit down in the shower, use a plastic, non-slip stool.  Keep the floor dry. Clean  up any water that spills on the floor as soon as it happens.  Remove soap buildup in the tub or shower regularly.  Attach bath mats securely with double-sided non-slip rug tape.  Do not have throw rugs and other things on the floor that can make you trip. What can I do in the bedroom?  Use night lights.  Make sure that you have a light by your bed that is easy to reach.  Do not use any sheets or blankets that are too big for your bed. They should not hang down onto the floor.  Have a firm chair that has side arms. You can use this for support while you get dressed.  Do not have throw rugs and other things on the floor that can make you trip. What can I do in the kitchen?  Clean up any spills right away.  Avoid walking on wet floors.  Keep items that you use a lot in easy-to-reach places.  If you need to reach something above you, use a strong step stool that has a grab bar.  Keep electrical cords out of the way.  Do not use floor polish or wax that makes floors slippery. If you must use wax,  use non-skid floor wax.  Do not have throw rugs and other things on the floor that can make you trip. What can I do with my stairs?  Do not leave any items on the stairs.  Make sure that there are handrails on both sides of the stairs and use them. Fix handrails that are broken or loose. Make sure that handrails are as long as the stairways.  Check any carpeting to make sure that it is firmly attached to the stairs. Fix any carpet that is loose or worn.  Avoid having throw rugs at the top or bottom of the stairs. If you do have throw rugs, attach them to the floor with carpet tape.  Make sure that you have a light switch at the top of the stairs and the bottom of the stairs. If you do not have them, ask someone to add them for you. What else can I do to help prevent falls?  Wear shoes that:  Do not have high heels.  Have rubber bottoms.  Are comfortable and fit you well.  Are  closed at the toe. Do not wear sandals.  If you use a stepladder:  Make sure that it is fully opened. Do not climb a closed stepladder.  Make sure that both sides of the stepladder are locked into place.  Ask someone to hold it for you, if possible.  Clearly mark and make sure that you can see:  Any grab bars or handrails.  First and last steps.  Where the edge of each step is.  Use tools that help you move around (mobility aids) if they are needed. These include:  Canes.  Walkers.  Scooters.  Crutches.  Turn on the lights when you go into a dark area. Replace any light bulbs as soon as they burn out.  Set up your furniture so you have a clear path. Avoid moving your furniture around.  If any of your floors are uneven, fix them.  If there are any pets around you, be aware of where they are.  Review your medicines with your doctor. Some medicines can make you feel dizzy. This can increase your chance of falling. Ask your doctor what other things that you can do to help prevent falls. This information is not intended to replace advice given to you by your health care provider. Make sure you discuss any questions you have with your health care provider. Document Released: 06/20/2009 Document Revised: 01/30/2016 Document Reviewed: 09/28/2014 Elsevier Interactive Patient Education  2017 [employer].
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
## 2019-10-18 ENCOUNTER — Other Ambulatory Visit: Payer: Self-pay

## 2019-10-18 ENCOUNTER — Other Ambulatory Visit
Admission: RE | Admit: 2019-10-18 | Discharge: 2019-10-18 | Disposition: A | Discharge status: Home / Self Care | Payer: Medicare Other | Source: Ambulatory Visit | Attending: Orthopedic Surgery | Admitting: Orthopedic Surgery

## 2019-10-18 ENCOUNTER — Ambulatory Visit: Payer: Medicare Other | Admitting: Family Medicine

## 2019-10-18 DIAGNOSIS — Z20822 Contact with and (suspected) exposure to covid-19: Secondary | ICD-10-CM | POA: Diagnosis not present

## 2019-10-18 DIAGNOSIS — Z01812 Encounter for preprocedural laboratory examination: Secondary | ICD-10-CM | POA: Insufficient documentation

## 2019-10-18 LAB — SARS CORONAVIRUS 2 (TAT 6-24 HRS): SARS Coronavirus 2: NEGATIVE

## 2019-10-20 ENCOUNTER — Ambulatory Visit: Payer: Medicare Other | Admitting: Anesthesiology

## 2019-10-20 ENCOUNTER — Other Ambulatory Visit: Payer: Self-pay

## 2019-10-20 ENCOUNTER — Encounter
Admission: RE | Disposition: A | Discharge status: Home / Self Care | Payer: Self-pay | Source: Home / Self Care | Attending: Orthopedic Surgery

## 2019-10-20 ENCOUNTER — Ambulatory Visit
Admission: RE | Admit: 2019-10-20 | Discharge: 2019-10-20 | Disposition: A | Discharge status: Home / Self Care | Payer: Medicare Other | Attending: Orthopedic Surgery | Admitting: Orthopedic Surgery

## 2019-10-20 ENCOUNTER — Encounter: Payer: Self-pay | Admitting: Orthopedic Surgery

## 2019-10-20 DIAGNOSIS — E119 Type 2 diabetes mellitus without complications: Secondary | ICD-10-CM | POA: Diagnosis not present

## 2019-10-20 DIAGNOSIS — I10 Essential (primary) hypertension: Secondary | ICD-10-CM | POA: Diagnosis not present

## 2019-10-20 DIAGNOSIS — S83242A Other tear of medial meniscus, current injury, left knee, initial encounter: Secondary | ICD-10-CM | POA: Insufficient documentation

## 2019-10-20 DIAGNOSIS — Z7984 Long term (current) use of oral hypoglycemic drugs: Secondary | ICD-10-CM | POA: Diagnosis not present

## 2019-10-20 DIAGNOSIS — J45909 Unspecified asthma, uncomplicated: Secondary | ICD-10-CM | POA: Insufficient documentation

## 2019-10-20 DIAGNOSIS — Z7982 Long term (current) use of aspirin: Secondary | ICD-10-CM | POA: Diagnosis not present

## 2019-10-20 DIAGNOSIS — Z87891 Personal history of nicotine dependence: Secondary | ICD-10-CM | POA: Diagnosis not present

## 2019-10-20 DIAGNOSIS — M2392 Unspecified internal derangement of left knee: Secondary | ICD-10-CM | POA: Diagnosis not present

## 2019-10-20 DIAGNOSIS — Z79899 Other long term (current) drug therapy: Secondary | ICD-10-CM | POA: Insufficient documentation

## 2019-10-20 DIAGNOSIS — M6752 Plica syndrome, left knee: Secondary | ICD-10-CM | POA: Diagnosis not present

## 2019-10-20 DIAGNOSIS — M23222 Derangement of posterior horn of medial meniscus due to old tear or injury, left knee: Secondary | ICD-10-CM | POA: Diagnosis not present

## 2019-10-20 DIAGNOSIS — W541XXA Struck by dog, initial encounter: Secondary | ICD-10-CM | POA: Insufficient documentation

## 2019-10-20 HISTORY — DX: Dizziness and giddiness: R42

## 2019-10-20 HISTORY — DX: Unspecified asthma, uncomplicated: J45.909

## 2019-10-20 HISTORY — DX: Unspecified hearing loss, right ear: H91.91

## 2019-10-20 HISTORY — PX: KNEE ARTHROSCOPY WITH MEDIAL MENISECTOMY: SHX5651

## 2019-10-20 HISTORY — DX: Presence of dental prosthetic device (complete) (partial): Z97.2

## 2019-10-20 HISTORY — DX: Presence of external hearing-aid: Z97.4

## 2019-10-20 HISTORY — DX: Asymptomatic varicose veins of bilateral lower extremities: I83.93

## 2019-10-20 LAB — GLUCOSE, CAPILLARY
Glucose-Capillary: 124 mg/dL — ABNORMAL HIGH (ref 70–99)
Glucose-Capillary: 96 mg/dL (ref 70–99)

## 2019-10-20 SURGERY — ARTHROSCOPY, KNEE, WITH MEDIAL MENISCECTOMY
Anesthesia: General | Site: Knee | Laterality: Left

## 2019-10-20 MED ORDER — MIDAZOLAM HCL 5 MG/5ML IJ SOLN
INTRAMUSCULAR | Status: DC | PRN
  Administered 2019-10-20 (×2): 1 mg via INTRAVENOUS

## 2019-10-20 MED ORDER — ALBUTEROL SULFATE (2.5 MG/3ML) 0.083% IN NEBU
2.5000 mg | INHALATION_SOLUTION | RESPIRATORY_TRACT | Status: DC | PRN
  Administered 2019-10-20: 2.5 mg via RESPIRATORY_TRACT

## 2019-10-20 MED ORDER — BUPIVACAINE HCL (PF) 0.5 % IJ SOLN
INTRAMUSCULAR | Status: DC | PRN
  Administered 2019-10-20: 10 mL

## 2019-10-20 MED ORDER — CLINDAMYCIN PHOSPHATE 900 MG/50ML IV SOLN
900.0000 mg | INTRAVENOUS | Status: AC
  Administered 2019-10-20: 900 mg via INTRAVENOUS

## 2019-10-20 MED ORDER — ONDANSETRON 4 MG PO TBDP: 4.0000 mg | ORAL_TABLET | Freq: Three times a day (TID) | ORAL | 0 refills | Status: DC | PRN

## 2019-10-20 MED ORDER — OXYCODONE HCL 5 MG/5ML PO SOLN: 5.0000 mg | Freq: Once | ORAL | Status: AC | PRN

## 2019-10-20 MED ORDER — LIDOCAINE HCL (CARDIAC) PF 100 MG/5ML IV SOSY
PREFILLED_SYRINGE | INTRAVENOUS | Status: DC | PRN
  Administered 2019-10-20: 40 mg via INTRATRACHEAL

## 2019-10-20 MED ORDER — IBUPROFEN 800 MG PO TABS: 800.0000 mg | ORAL_TABLET | Freq: Three times a day (TID) | ORAL | 1 refills | Status: AC

## 2019-10-20 MED ORDER — OXYCODONE HCL 5 MG PO TABS
5.0000 mg | ORAL_TABLET | Freq: Once | ORAL | Status: AC | PRN
  Administered 2019-10-20: 5 mg via ORAL

## 2019-10-20 MED ORDER — LACTATED RINGERS IR SOLN
Status: DC | PRN
  Administered 2019-10-20: 12000 mL

## 2019-10-20 MED ORDER — ASPIRIN EC 325 MG PO TBEC: 325.0000 mg | DELAYED_RELEASE_TABLET | Freq: Every day | ORAL | 0 refills | Status: AC

## 2019-10-20 MED ORDER — CHLORHEXIDINE GLUCONATE 4 % EX LIQD: 60.0000 mL | Freq: Once | CUTANEOUS | Status: DC

## 2019-10-20 MED ORDER — PROPOFOL 10 MG/ML IV BOLUS
INTRAVENOUS | Status: DC | PRN
  Administered 2019-10-20: 120 mg via INTRAVENOUS

## 2019-10-20 MED ORDER — FENTANYL CITRATE (PF) 100 MCG/2ML IJ SOLN
25.0000 ug | INTRAMUSCULAR | Status: DC | PRN
  Administered 2019-10-20: 25 ug via INTRAVENOUS

## 2019-10-20 MED ORDER — GLYCOPYRROLATE 0.2 MG/ML IJ SOLN
INTRAMUSCULAR | Status: DC | PRN
  Administered 2019-10-20: .1 mg via INTRAVENOUS

## 2019-10-20 MED ORDER — ACETAMINOPHEN 500 MG PO TABS: 1000.0000 mg | ORAL_TABLET | Freq: Three times a day (TID) | ORAL | 2 refills | Status: AC

## 2019-10-20 MED ORDER — DEXAMETHASONE SODIUM PHOSPHATE 4 MG/ML IJ SOLN
INTRAMUSCULAR | Status: DC | PRN
  Administered 2019-10-20: 4 mg via INTRAVENOUS

## 2019-10-20 MED ORDER — LIDOCAINE-EPINEPHRINE 1 %-1:100000 IJ SOLN
INTRAMUSCULAR | Status: DC | PRN
  Administered 2019-10-20: 15 mL

## 2019-10-20 MED ORDER — ACETAMINOPHEN 160 MG/5ML PO SOLN: 325.0000 mg | ORAL | Status: DC | PRN

## 2019-10-20 MED ORDER — LACTATED RINGERS IV SOLN: INTRAVENOUS | Status: DC

## 2019-10-20 MED ORDER — FENTANYL CITRATE (PF) 100 MCG/2ML IJ SOLN
INTRAMUSCULAR | Status: DC | PRN
  Administered 2019-10-20 (×4): 25 ug via INTRAVENOUS

## 2019-10-20 MED ORDER — HYDROCODONE-ACETAMINOPHEN 5-325 MG PO TABS: 1.0000 | ORAL_TABLET | ORAL | 0 refills | Status: DC | PRN

## 2019-10-20 MED ORDER — ONDANSETRON HCL 4 MG/2ML IJ SOLN
INTRAMUSCULAR | Status: DC | PRN
  Administered 2019-10-20: 4 mg via INTRAVENOUS

## 2019-10-20 MED ORDER — ACETAMINOPHEN 325 MG PO TABS: 325.0000 mg | ORAL_TABLET | ORAL | Status: DC | PRN

## 2019-10-20 SURGICAL SUPPLY — 38 items
ADAPTER IRRIG TUBE 2 SPIKE SOL (ADAPTER) ×4 IMPLANT
BLADE SURG SZ11 CARB STEEL (BLADE) ×2 IMPLANT
BNDG COHESIVE 4X5 TAN STRL (GAUZE/BANDAGES/DRESSINGS) ×2 IMPLANT
BNDG ESMARK 6X12 TAN STRL LF (GAUZE/BANDAGES/DRESSINGS) ×2 IMPLANT
BUR RADIUS 3.5 (BURR) IMPLANT
BUR RADIUS 4.0X18.5 (BURR) ×2 IMPLANT
CHLORAPREP W/TINT 26 (MISCELLANEOUS) ×2 IMPLANT
COOLER POLAR GLACIER W/PUMP (MISCELLANEOUS) ×2 IMPLANT
COVER LIGHT HANDLE UNIVERSAL (MISCELLANEOUS) ×4 IMPLANT
CUFF TOURN SGL QUICK 30 (TOURNIQUET CUFF) ×1
CUFF TRNQT CYL 30X4X21-28X (TOURNIQUET CUFF) ×1 IMPLANT
DRAPE IMP U-DRAPE 54X76 (DRAPES) ×2 IMPLANT
GAUZE SPONGE 4X4 12PLY STRL (GAUZE/BANDAGES/DRESSINGS) ×2 IMPLANT
GAUZE XEROFORM 1X8 LF (GAUZE/BANDAGES/DRESSINGS) ×2 IMPLANT
GLOVE BIO SURGEON STRL SZ7.5 (GLOVE) ×2 IMPLANT
GLOVE BIOGEL PI IND STRL 8 (GLOVE) ×1 IMPLANT
GLOVE BIOGEL PI INDICATOR 8 (GLOVE) ×1
GOWN STRL REUS W/ TWL LRG LVL3 (GOWN DISPOSABLE) ×1 IMPLANT
GOWN STRL REUS W/TWL LRG LVL3 (GOWN DISPOSABLE) ×1
GOWN STRL REUS W/TWL XL LVL3 (GOWN DISPOSABLE) ×2 IMPLANT
IV LACTATED RINGER IRRG 3000ML (IV SOLUTION) ×4
IV LR IRRIG 3000ML ARTHROMATIC (IV SOLUTION) ×4 IMPLANT
KIT TURNOVER KIT A (KITS) ×2 IMPLANT
MANIFOLD NEPTUNE II (INSTRUMENTS) ×2 IMPLANT
MAT ABSORB  FLUID 56X50 GRAY (MISCELLANEOUS) ×1
MAT ABSORB FLUID 56X50 GRAY (MISCELLANEOUS) ×1 IMPLANT
PACK ARTHROSCOPY KNEE (MISCELLANEOUS) ×2 IMPLANT
PAD WRAPON POLAR KNEE (MISCELLANEOUS) ×1 IMPLANT
PADDING CAST BLEND 6X4 STRL (MISCELLANEOUS) ×1 IMPLANT
PADDING STRL CAST 6IN (MISCELLANEOUS) ×1
SET TUBE SUCT SHAVER OUTFL 24K (TUBING) ×2 IMPLANT
SET TUBE TIP INTRA-ARTICULAR (MISCELLANEOUS) ×2 IMPLANT
SUT ETHILON 3-0 FS-10 30 BLK (SUTURE) ×2
SUTURE EHLN 3-0 FS-10 30 BLK (SUTURE) ×1 IMPLANT
TOWEL OR 17X26 4PK STRL BLUE (TOWEL DISPOSABLE) ×4 IMPLANT
TUBING ARTHRO INFLOW-ONLY STRL (TUBING) ×2 IMPLANT
WAND WEREWOLF FLOW 90D (MISCELLANEOUS) IMPLANT
WRAPON POLAR PAD KNEE (MISCELLANEOUS) ×2

## 2019-10-20 NOTE — Anesthesia Postprocedure Evaluation (Signed)
Anesthesia Post Note  Patient: Jill Guzman  Procedure(s) Performed: KNEE ARTHROSCOPY WITH PARTIAL MEDIAL MENISECTOMY (Left Knee)     Patient location during evaluation: PACU Anesthesia Type: General Level of consciousness: awake and alert Pain management: pain level controlled Vital Signs Assessment: post-procedure vital signs reviewed and stable Respiratory status: spontaneous breathing, nonlabored ventilation, respiratory function stable and patient connected to nasal cannula oxygen Cardiovascular status: blood pressure returned to baseline and stable Postop Assessment: no apparent nausea or vomiting Anesthetic complications: no    Adele Barthel Jarrette Dehner

## 2019-10-20 NOTE — Addendum Note (Signed)
Addendum  created 10/20/19 1522 by Hillery Aldo, MD   Order list changed

## 2019-10-20 NOTE — Op Note (Signed)
Operative Note    SURGERY DATE: 10/20/2019   PRE-OP DIAGNOSIS:  1. Left medial meniscus tear 2. Left medial plica band   POST-OP DIAGNOSIS:  1. Left medial meniscus tear 2. Left medial plica band   PROCEDURES:  1.  Left knee arthroscopy, partial medial meniscectomy 2.  Left knee partial synovectomy with medial plica excision 3.  Left knee chondroplasty of patellofemoral compartment   SURGEON: Cato Mulligan, MD   ANESTHESIA: Gen   ESTIMATED BLOOD LOSS: minimal   TOTAL IV FLUIDS: per anesthesia   INDICATION(S):  Jill Guzman is a 79 y.o. female with signs and symptoms as well as MRI finding of medial meniscus tear with displaced fragment. After discussion of risks, benefits, and alternatives to surgery, the patient elected to proceed.   OPERATIVE FINDINGS:    Examination under anesthesia: A careful examination under anesthesia was performed.  Passive range of motion was: Hyperextension: 1.  Extension: 0.  Flexion: 130.  Lachman: normal. Pivot Shift: normal.  Posterior drawer: normal.  Varus stability in full extension: normal.  Varus stability in 30 degrees of flexion: normal.  Valgus stability in full extension: normal.  Valgus stability in 30 degrees of flexion: normal.   Intra-operative findings: A thorough arthroscopic examination of the knee was performed.  The findings are: 1. Suprapatellar pouch: Normal 2. Undersurface of median ridge: Grade 2-3 degenerative changes 3. Medial patellar facet: Grade 2 degenerative changes 4. Lateral patellar facet: Grade 2-3 degenerative changes 5. Trochlea: Normal 6. Lateral gutter/popliteus tendon: Normal 7. Hoffa's fat pad: Inflamed 8. Medial gutter/plica: Prominent plica band 9. ACL: Normal 10. PCL: Normal 11. Medial meniscus: Complex tear of the posterior horn and body with primarily horizontal tear pattern with parrot-beak fragment at the posterior horn/body junction with flipped fragment into the tibial gutter.  There was an  additional flipped fragment in the posterior intercondylar notch from the anterior portion of the posterior horn adjacent to the meniscus root 12. Medial compartment cartilage: Focal areas of grade 2-3 degenerative changes to the tibial plateau.  Normal medial femoral condyle except some grade 2-3 degenerative changes adjacent to the intercondylar notch 13. Lateral meniscus: Normal 14. Lateral compartment cartilage: Grade 1-2 degenerative changes to the tibial plateau and lateral femoral condyle   OPERATIVE REPORT:     I identified Jill Guzman in the pre-operative holding area. I marked the operative knee with my initials. I reviewed the risks and benefits of the proposed surgical intervention and the patient wished to proceed. The patient was transferred to the operative suite and placed in the supine position with all bony prominences padded.  Anesthesia was administered. Appropriate IV antibiotics were administered prior to incision. The extremity was then prepped and draped in standard fashion. A time out was performed confirming the correct extremity, correct patient, and correct procedure.   Arthroscopy portals were marked. Local anesthetic was injected to the planned portal sites. The anterolateral portal was established with an 11 blade.      The arthroscope was placed in the anterolateral portal and then into the suprapatellar pouch. Next, the medial portal was established under needle localization. A diagnostic knee scope was completed with the above findings. The medial meniscus tear was identified.   The MCL was pie-crusted to improve visualization of the posterior horn. The meniscal tear was debrided using an arthroscopic biter and an oscillating shaver until the meniscus had stable borders.  The medial plica band was excised using combination of an oscillating shaver and ArthroCare wand.  A chondroplasty was performed of the patellofemoral compartment such that there were stable  cartilage edges without any loose fragments of cartilage. Arthroscopic fluid was removed from the joint.   The portals were closed with 3-0 Nylon suture. Sterile dressings included Xeroform, 4x4s, Sof-Rol, and Bias wrap. A Polarcare was placed.  The patient was then awakened and taken to the PACU hemodynamically stable without complication.   POSTOPERATIVE PLAN: The patient will be discharged home today once they meet PACU criteria. Aspirin 325 mg daily was prescribed for 2 weeks for DVT prophylaxis.  Physical therapy will start on POD#3-4. Weight-bearing as tolerated. Follow up in 2 weeks per protocol.

## 2019-10-20 NOTE — Anesthesia Procedure Notes (Signed)
Procedure Name: LMA Insertion Date/Time: 10/20/2019 1:46 PM Performed by: Cameron Ali, CRNA Pre-anesthesia Checklist: Patient identified, Emergency Drugs available, Suction available, Timeout performed and Patient being monitored Patient Re-evaluated:Patient Re-evaluated prior to induction Oxygen Delivery Method: Circle system utilized Preoxygenation: Pre-oxygenation with 100% oxygen Induction Type: IV induction LMA: LMA inserted LMA Size: 3.0 Number of attempts: 1 Placement Confirmation: positive ETCO2 and breath sounds checked- equal and bilateral Tube secured with: Tape Dental Injury: Teeth and Oropharynx as per pre-operative assessment

## 2019-10-20 NOTE — H&P (Signed)
Paper H&P to be scanned into permanent record. H&P reviewed. No significant changes noted.  

## 2019-10-20 NOTE — Transfer of Care (Signed)
Immediate Anesthesia Transfer of Care Note  Patient: Jill Guzman  Procedure(s) Performed: KNEE ARTHROSCOPY WITH PARTIAL MEDIAL MENISECTOMY (Left Knee)  Patient Location: PACU  Anesthesia Type: General  Level of Consciousness: awake, alert  and patient cooperative  Airway and Oxygen Therapy: Patient Spontanous Breathing and Patient connected to supplemental oxygen  Post-op Assessment: Post-op Vital signs reviewed, Patient's Cardiovascular Status Stable, Respiratory Function Stable, Patent Airway and No signs of Nausea or vomiting  Post-op Vital Signs: Reviewed and stable  Complications: No apparent anesthesia complications

## 2019-10-20 NOTE — Discharge Instructions (Signed)
Arthroscopic Knee Surgery - Partial Meniscectomy   Post-Op Instructions   1. Bracing or crutches: Crutches will be provided at the time of discharge from the surgery center if you do not already have them.   2. Ice: You may be provided with a device [facility]) that allows you to ice the affected area effectively. Otherwise you can ice manually.    3. Driving:  Plan on not driving for at least two weeks. Please note that you are advised NOT to drive while taking narcotic pain medications as you may be impaired and unsafe to drive.   4. Activity: Ankle pumps several times an hour while awake to prevent blood clots. Weight bearing: as tolerated. Use crutches for as needed (usually ~1 week or less) until pain allows you to ambulate without a limp. Bending and straightening the knee is unlimited. Elevate knee above heart level as much as possible for one week. Avoid standing more than 5 minutes (consecutively) for the first week.  Avoid long distance travel for 2 weeks.  5. Medications:  - You have been provided a prescription for narcotic pain medicine. After surgery, take 1-2 narcotic tablets every 4 hours if needed for severe pain.  - You may take up to 3000mg /day of tylenol (acetaminophen). You can take 1000mg  3x/day. Please check your narcotic. If you have acetaminophen in your narcotic (each tablet will be 325mg ), be careful not to exceed a total of 3000mg /day of acetaminophen.  - A prescription for anti-nausea medication will be provided in case the narcotic medicine or anesthesia causes nausea - take 1 tablet every 6 hours only if nauseated.  - Take ibuprofen 800 mg every 8 hours WITH food to reduce post-operative knee swelling. DO NOT STOP IBUPROFEN POST-OP UNTIL INSTRUCTED TO DO SO at first post-op office visit (10-14 days after surgery). However, please discontinue if you have any abdominal discomfort after taking this.  - Take enteric coated aspirin 325 mg once daily for 2 weeks to prevent  blood clots.    6. Bandages: The physical therapist should change the bandages at the first post-op appointment. If needed, the dressing supplies have been provided to you.   7. Physical Therapy: 1-2 times per week for 6 weeks. Therapy typically starts on post operative Day 3 or 4. You have been provided an order for physical therapy. The therapist will provide home exercises.   8. Work: May return to full work usually around 2 weeks after 1st post-operative visit. May do light duty/desk job in approximately 1-2 weeks when off of narcotics, pain is well-controlled, and swelling has decreased. Labor intensive jobs may require 4-6 weeks to return.      9. Post-Op Appointments: Your first post-op appointment will be with Dr. Posey Pronto in approximately 2 weeks time.    If you find that they have not been scheduled please call the Orthopaedic Appointment front desk at [phone].     General Anesthesia, Adult, Care After This sheet gives you information about how to care for yourself after your procedure. Your health care provider may also give you more specific instructions. If you have problems or questions, contact your health care provider. What can I expect after the procedure? After the procedure, the following side effects are common:  Pain or discomfort at the IV site.  Nausea.  Vomiting.  Sore throat.  Trouble concentrating.  Feeling cold or chills.  Weak or tired.  Sleepiness and fatigue.  Soreness and body aches. These side effects can affect parts  of the body that were not involved in surgery. Follow these instructions at home:  For at least 24 hours after the procedure:  Have a responsible adult stay with you. It is important to have someone help care for you until you are awake and alert.  Rest as needed.  Do not: ? Participate in activities in which you could fall or become injured. ? Drive. ? Use heavy machinery. ? Drink alcohol. ? Take sleeping pills or  medicines that cause drowsiness. ? Make important decisions or sign legal documents. ? Take care of children on your own. Eating and drinking  Follow any instructions from your health care provider about eating or drinking restrictions.  When you feel hungry, start by eating small amounts of foods that are soft and easy to digest (bland), such as toast. Gradually return to your regular diet.  Drink enough fluid to keep your urine pale yellow.  If you vomit, rehydrate by drinking water, juice, or clear broth. General instructions  If you have sleep apnea, surgery and certain medicines can increase your risk for breathing problems. Follow instructions from your health care provider about wearing your sleep device: ? Anytime you are sleeping, including during daytime naps. ? While taking prescription pain medicines, sleeping medicines, or medicines that make you drowsy.  Return to your normal activities as told by your health care provider. Ask your health care provider what activities are safe for you.  Take over-the-counter and prescription medicines only as told by your health care provider.  If you smoke, do not smoke without supervision.  Keep all follow-up visits as told by your health care provider. This is important. Contact a health care provider if:  You have nausea or vomiting that does not get better with medicine.  You cannot eat or drink without vomiting.  You have pain that does not get better with medicine.  You are unable to pass urine.  You develop a skin rash.  You have a fever.  You have redness around your IV site that gets worse. Get help right away if:  You have difficulty breathing.  You have chest pain.  You have blood in your urine or stool, or you vomit blood. Summary  After the procedure, it is common to have a sore throat or nausea. It is also common to feel tired.  Have a responsible adult stay with you for the first 24 hours after general  anesthesia. It is important to have someone help care for you until you are awake and alert.  When you feel hungry, start by eating small amounts of foods that are soft and easy to digest (bland), such as toast. Gradually return to your regular diet.  Drink enough fluid to keep your urine pale yellow.  Return to your normal activities as told by your health care provider. Ask your health care provider what activities are safe for you. This information is not intended to replace advice given to you by your health care provider. Make sure you discuss any questions you have with your health care provider. Document Revised: 08/27/2017 Document Reviewed: 04/09/2017 Elsevier Patient Education  [place].

## 2019-10-20 NOTE — Anesthesia Preprocedure Evaluation (Signed)
Anesthesia Evaluation  Patient identified by MRN, date of birth, ID band Patient awake    History of Anesthesia Complications Negative for: history of anesthetic complications  Airway Mallampati: II  TM Distance: >3 FB Neck ROM: Full    Dental  (+) Edentulous Upper, Edentulous Lower   Pulmonary asthma , former smoker,    Pulmonary exam normal        Cardiovascular hypertension, Pt. on medications Normal cardiovascular exam     Neuro/Psych    GI/Hepatic negative GI ROS, Neg liver ROS,   Endo/Other  diabetes  Renal/GU negative Renal ROS     Musculoskeletal   Abdominal   Peds  Hematology negative hematology ROS (+)   Anesthesia Other Findings   Reproductive/Obstetrics                             Anesthesia Physical Anesthesia Plan  ASA: II  Anesthesia Plan: General   Post-op Pain Management:    Induction:   PONV Risk Score and Plan: 3 and Ondansetron and Treatment may vary due to age or medical condition  Airway Management Planned: LMA  Additional Equipment: None  Intra-op Plan:   Post-operative Plan: Extubation in OR  Informed Consent: I have reviewed the patients History and Physical, chart, labs and discussed the procedure including the risks, benefits and alternatives for the proposed anesthesia with the patient or authorized representative who has indicated his/her understanding and acceptance.       Plan Discussed with: CRNA  Anesthesia Plan Comments:         Anesthesia Quick Evaluation

## 2019-10-23 ENCOUNTER — Encounter: Payer: Self-pay | Admitting: *Deleted

## 2019-10-23 DIAGNOSIS — M25662 Stiffness of left knee, not elsewhere classified: Secondary | ICD-10-CM | POA: Diagnosis not present

## 2019-10-23 DIAGNOSIS — Z9889 Other specified postprocedural states: Secondary | ICD-10-CM | POA: Diagnosis not present

## 2019-10-23 DIAGNOSIS — M25562 Pain in left knee: Secondary | ICD-10-CM | POA: Diagnosis not present

## 2019-10-23 DIAGNOSIS — M6281 Muscle weakness (generalized): Secondary | ICD-10-CM | POA: Diagnosis not present

## 2019-10-25 DIAGNOSIS — M25662 Stiffness of left knee, not elsewhere classified: Secondary | ICD-10-CM | POA: Diagnosis not present

## 2019-10-25 DIAGNOSIS — M25562 Pain in left knee: Secondary | ICD-10-CM | POA: Diagnosis not present

## 2019-10-25 DIAGNOSIS — M6281 Muscle weakness (generalized): Secondary | ICD-10-CM | POA: Diagnosis not present

## 2019-10-25 DIAGNOSIS — Z9889 Other specified postprocedural states: Secondary | ICD-10-CM | POA: Diagnosis not present

## 2019-10-26 ENCOUNTER — Other Ambulatory Visit: Payer: Self-pay | Admitting: Family Medicine

## 2019-10-26 DIAGNOSIS — I10 Essential (primary) hypertension: Secondary | ICD-10-CM

## 2019-10-31 DIAGNOSIS — Z9889 Other specified postprocedural states: Secondary | ICD-10-CM | POA: Diagnosis not present

## 2019-10-31 DIAGNOSIS — M25562 Pain in left knee: Secondary | ICD-10-CM | POA: Diagnosis not present

## 2019-10-31 DIAGNOSIS — M25662 Stiffness of left knee, not elsewhere classified: Secondary | ICD-10-CM | POA: Diagnosis not present

## 2019-10-31 DIAGNOSIS — M6281 Muscle weakness (generalized): Secondary | ICD-10-CM | POA: Diagnosis not present

## 2019-11-03 DIAGNOSIS — M25662 Stiffness of left knee, not elsewhere classified: Secondary | ICD-10-CM | POA: Diagnosis not present

## 2019-11-03 DIAGNOSIS — M6281 Muscle weakness (generalized): Secondary | ICD-10-CM | POA: Diagnosis not present

## 2019-11-03 DIAGNOSIS — M25562 Pain in left knee: Secondary | ICD-10-CM | POA: Diagnosis not present

## 2019-11-03 DIAGNOSIS — Z9889 Other specified postprocedural states: Secondary | ICD-10-CM | POA: Diagnosis not present

## 2019-11-07 DIAGNOSIS — Z9889 Other specified postprocedural states: Secondary | ICD-10-CM | POA: Diagnosis not present

## 2019-11-07 DIAGNOSIS — M25562 Pain in left knee: Secondary | ICD-10-CM | POA: Diagnosis not present

## 2019-11-07 DIAGNOSIS — M25662 Stiffness of left knee, not elsewhere classified: Secondary | ICD-10-CM | POA: Diagnosis not present

## 2019-11-07 DIAGNOSIS — M6281 Muscle weakness (generalized): Secondary | ICD-10-CM | POA: Diagnosis not present

## 2019-11-09 ENCOUNTER — Other Ambulatory Visit: Payer: Self-pay | Admitting: Family Medicine

## 2019-11-09 DIAGNOSIS — M25562 Pain in left knee: Secondary | ICD-10-CM | POA: Diagnosis not present

## 2019-11-09 DIAGNOSIS — M6281 Muscle weakness (generalized): Secondary | ICD-10-CM | POA: Diagnosis not present

## 2019-11-09 DIAGNOSIS — E7849 Other hyperlipidemia: Secondary | ICD-10-CM

## 2019-11-09 DIAGNOSIS — Z9889 Other specified postprocedural states: Secondary | ICD-10-CM | POA: Diagnosis not present

## 2019-11-09 DIAGNOSIS — M25662 Stiffness of left knee, not elsewhere classified: Secondary | ICD-10-CM | POA: Diagnosis not present

## 2019-11-14 DIAGNOSIS — Z9889 Other specified postprocedural states: Secondary | ICD-10-CM | POA: Diagnosis not present

## 2019-11-14 DIAGNOSIS — M6281 Muscle weakness (generalized): Secondary | ICD-10-CM | POA: Diagnosis not present

## 2019-11-14 DIAGNOSIS — M25662 Stiffness of left knee, not elsewhere classified: Secondary | ICD-10-CM | POA: Diagnosis not present

## 2019-11-14 DIAGNOSIS — M25562 Pain in left knee: Secondary | ICD-10-CM | POA: Diagnosis not present

## 2019-11-15 ENCOUNTER — Other Ambulatory Visit: Payer: Self-pay

## 2019-11-15 ENCOUNTER — Ambulatory Visit (INDEPENDENT_AMBULATORY_CARE_PROVIDER_SITE_OTHER): Payer: Medicare Other | Admitting: Family Medicine

## 2019-11-15 ENCOUNTER — Encounter: Payer: Self-pay | Admitting: Family Medicine

## 2019-11-15 VITALS — BP 130/80 | HR 64 | Ht 63.0 in | Wt 130.0 lb

## 2019-11-15 DIAGNOSIS — F419 Anxiety disorder, unspecified: Secondary | ICD-10-CM

## 2019-11-15 DIAGNOSIS — R69 Illness, unspecified: Secondary | ICD-10-CM

## 2019-11-15 DIAGNOSIS — F3341 Major depressive disorder, recurrent, in partial remission: Secondary | ICD-10-CM

## 2019-11-15 DIAGNOSIS — I10 Essential (primary) hypertension: Secondary | ICD-10-CM

## 2019-11-15 DIAGNOSIS — E7849 Other hyperlipidemia: Secondary | ICD-10-CM | POA: Diagnosis not present

## 2019-11-15 MED ORDER — ESCITALOPRAM OXALATE 20 MG PO TABS: 20.0000 mg | ORAL_TABLET | Freq: Every day | ORAL | 1 refills | Status: DC

## 2019-11-15 MED ORDER — ATORVASTATIN CALCIUM 10 MG PO TABS: 10.0000 mg | ORAL_TABLET | Freq: Every day | ORAL | 1 refills | Status: DC

## 2019-11-15 MED ORDER — AMLODIPINE BESYLATE 2.5 MG PO TABS: 2.5000 mg | ORAL_TABLET | Freq: Every day | ORAL | 1 refills | Status: DC

## 2019-11-15 MED ORDER — LOSARTAN POTASSIUM 100 MG PO TABS: 100.0000 mg | ORAL_TABLET | Freq: Every day | ORAL | 1 refills | Status: DC

## 2019-11-15 NOTE — Progress Notes (Signed)
Date:  11/15/2019   Name:  Jill Guzman   DOB:  [birth year]   MRN:  [id number]   Chief Complaint: Hypertension, Hyperlipidemia, and Depression  Hypertension This is a chronic problem. The current episode started more than 1 year ago. The problem has been gradually improving since onset. The problem is controlled. Pertinent negatives include no anxiety, blurred vision, chest pain, headaches, malaise/fatigue, neck pain, orthopnea, palpitations, peripheral edema, PND, shortness of breath or sweats. There are no associated agents to hypertension. There are no known risk factors for coronary artery disease. Past treatments include angiotensin blockers, calcium channel blockers and diuretics. The current treatment provides moderate improvement. There are no compliance problems.  There is no history of angina, kidney disease, CAD/MI, CVA, heart failure, left ventricular hypertrophy, PVD or retinopathy. There is no history of chronic renal disease, a hypertension causing med or renovascular disease.  Hyperlipidemia This is a chronic problem. The current episode started more than 1 year ago. The problem is controlled. She has no history of chronic renal disease, diabetes, hypothyroidism, liver disease, obesity or nephrotic syndrome. Pertinent negatives include no chest pain, focal sensory loss, focal weakness, leg pain, myalgias or shortness of breath. Current antihyperlipidemic treatment includes statins (omega 3). The current treatment provides moderate improvement of lipids. There are no compliance problems.  Risk factors for coronary artery disease include post-menopausal.  Depression        This is a chronic problem.  The current episode started more than 1 year ago.   The problem occurs intermittently.  The problem has been gradually improving since onset.  Associated symptoms include no decreased concentration, no fatigue, no helplessness, no hopelessness, does not have insomnia, not irritable, no  restlessness, no decreased interest, no appetite change, no body aches, no myalgias, no headaches, no indigestion, not sad and no suicidal ideas.     The symptoms are aggravated by nothing.  Past treatments include SSRIs - Selective serotonin reuptake inhibitors.  Previous treatment provided moderate relief.   Pertinent negatives include no hypothyroidism and no anxiety.   Lab Results  Component Value Date   CREATININE 0.63 03/29/2019   BUN 15 03/29/2019   NA 141 03/29/2019   K 4.5 03/29/2019   CL 102 03/29/2019   CO2 25 03/29/2019   Lab Results  Component Value Date   CHOL 159 03/29/2019   HDL 55 03/29/2019   LDLCALC 86 03/29/2019   TRIG 89 03/29/2019   CHOLHDL 2.9 03/29/2019   Lab Results  Component Value Date   TSH 1.650 03/29/2019   Lab Results  Component Value Date   HGBA1C 6.4 10/09/2019     Review of Systems  Constitutional: Negative.  Negative for appetite change, chills, fatigue, fever, malaise/fatigue and unexpected weight change.  HENT: Negative for congestion, ear discharge, ear pain, rhinorrhea, sinus pressure, sneezing and sore throat.   Eyes: Negative for blurred vision, photophobia, pain, discharge, redness and itching.  Respiratory: Negative for cough, shortness of breath, wheezing and stridor.   Cardiovascular: Negative for chest pain, palpitations, orthopnea and PND.  Gastrointestinal: Negative for abdominal pain, blood in stool, constipation, diarrhea, nausea and vomiting.  Endocrine: Negative for cold intolerance, heat intolerance, polydipsia, polyphagia and polyuria.  Genitourinary: Negative for dysuria, flank pain, frequency, hematuria, menstrual problem, pelvic pain, urgency, vaginal bleeding and vaginal discharge.  Musculoskeletal: Negative for arthralgias, back pain, myalgias and neck pain.  Skin: Negative for rash.  Allergic/Immunologic: Negative for environmental allergies and food allergies.  Neurological: Negative for dizziness,  focal weakness,  weakness, light-headedness, numbness and headaches.  Hematological: Negative for adenopathy. Does not bruise/bleed easily.  Psychiatric/Behavioral: Positive for depression. Negative for decreased concentration, dysphoric mood and suicidal ideas. The patient is not nervous/anxious and does not have insomnia.     Patient Active Problem List   Diagnosis Date Noted  . Nonexudative age-related macular degeneration, bilateral, early dry stage 03/27/2019  . Recurrent major depressive disorder, in partial remission ([place]) 02/14/2018  . Chronic anxiety 02/09/2017  . AB (asthmatic bronchitis), mild intermittent, uncomplicated 35/57/3220  . Chronic obstructive pulmonary disease ([place]) 07/14/2016  . 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 Routine general medical examination at a health care facility 12/31/2014  . Diabetes ([place]) 12/31/2014  . BP (high blood pressure) 12/31/2014  . Osteopenia 12/31/2014    Allergies  Allergen Reactions  . Penicillins Itching  . Sulfa Antibiotics Itching    Past Surgical History:  Procedure Laterality Date  . ABDOMINAL HYSTERECTOMY    . BREAST EXCISIONAL BIOPSY Left 1976   neg  . CATARACT EXTRACTION W/ INTRAOCULAR LENS  IMPLANT, BILATERAL    . COLONOSCOPY  2014   normal  . cyst on bladder removed    . cyst removed breast Left   . KNEE ARTHROSCOPY WITH MEDIAL MENISECTOMY Left 10/20/2019   Procedure: KNEE ARTHROSCOPY WITH PARTIAL MEDIAL MENISECTOMY;  Surgeon: Leim Fabry, MD;  Location: [facility];  Service: Orthopedics;  Laterality: Left;  DIABETIC - oral meds  . PARTIAL HYSTERECTOMY    . TUBAL LIGATION      Social History   Tobacco Use  . Smoking status: Former Smoker    Packs/day: 2.00    Years: 10.00    Pack years: 20.00    Types: Cigarettes    Quit date: 05/11/1999    Years since quitting: 20.5  . Smokeless tobacco: Never Used  . Tobacco  comment: Smoking cessation materials not required  Substance Use Topics  . Alcohol use: No    Alcohol/week: 0.0 standard drinks  . Drug use: No     Medication list has been reviewed and updated.  Current Meds  Medication Sig  . acetaminophen (TYLENOL) 500 MG tablet Take 2 tablets (1,000 mg total) by mouth every 8 (eight) hours.  Marland Kitchen albuterol (VENTOLIN HFA) 108 (90 Base) MCG/ACT inhaler Inhale 1 puff into the lungs QID.  Marland Kitchen amLODipine (NORVASC) 2.5 MG tablet Take 1 tablet (2.5 mg total) by mouth daily.  Marland Kitchen atorvastatin (LIPITOR) 10 MG tablet Take 1 tablet by mouth once daily  . Cholecalciferol (VITAMIN D) 50 MCG (2000 UT) CAPS Take by mouth.  . escitalopram (LEXAPRO) 20 MG tablet Take 1 tablet (20 mg total) by mouth daily.  Marland Kitchen glucose blood (ACCU-CHEK AVIVA PLUS) test strip USE ONE STRIP TO CHECK GLUCOSE ONCE DAILY  . losartan (COZAAR) 100 MG tablet Take 1 tablet by mouth once daily  . metFORMIN (GLUCOPHAGE) 500 MG tablet Take 1 tablet (500 mg total) by mouth 2 (two) times daily. (Patient taking differently: Take 500 mg by mouth 2 (two) times daily with a meal. I tab bid)  . Omega 3 1000 MG CAPS Take 1 capsule (1,000 mg total) by mouth daily.  Marland Kitchen spironolactone (ALDACTONE) 25 MG tablet Take 25 mg by mouth daily.    PHQ 2/9 Scores 11/15/2019 10/16/2019 09/04/2019 03/29/2019  PHQ - 2 Score 0 2 4 0  PHQ- 9 Score 0 6 10 2     BP Readings from  Last 3 Encounters:  11/15/19 130/80  10/20/19 (!) 150/77  10/16/19 126/77    Physical Exam Vitals and nursing note reviewed.  Constitutional:      General: She is not irritable.    Appearance: She is well-developed.  HENT:     Head: Normocephalic.     Right Ear: Tympanic membrane and external ear normal.     Left Ear: Tympanic membrane and external ear normal.     Nose: Nose normal. No rhinorrhea.     Mouth/Throat:     Mouth: Mucous membranes are moist.  Eyes:     General: Lids are everted, no foreign bodies appreciated. No scleral icterus.         Left eye: No foreign body or hordeolum.     Conjunctiva/sclera: Conjunctivae normal.     Right eye: Right conjunctiva is not injected.     Left eye: Left conjunctiva is not injected.     Pupils: Pupils are equal, round, and reactive to light.  Neck:     Thyroid: No thyromegaly.     Vascular: No JVD.     Trachea: No tracheal deviation.  Cardiovascular:     Rate and Rhythm: Normal rate and regular rhythm.     Heart sounds: Normal heart sounds. No murmur. No friction rub. No gallop.   Pulmonary:     Effort: Pulmonary effort is normal. No respiratory distress.     Breath sounds: Normal breath sounds. No wheezing, rhonchi or rales.  Abdominal:     General: Bowel sounds are normal.     Palpations: Abdomen is soft. There is no mass.     Tenderness: There is no abdominal tenderness. There is no guarding or rebound.  Musculoskeletal:        General: No tenderness. Normal range of motion.     Cervical back: Normal range of motion and neck supple.  Lymphadenopathy:     Cervical: No cervical adenopathy.  Skin:    General: Skin is warm.     Findings: No rash.  Neurological:     Mental Status: She is alert and oriented to person, place, and time.     Cranial Nerves: No cranial nerve deficit.     Deep Tendon Reflexes: Reflexes normal.  Psychiatric:        Mood and Affect: Mood is not anxious or depressed.     Wt Readings from Last 3 Encounters:  11/15/19 130 lb (59 kg)  10/20/19 132 lb (59.9 kg)  10/16/19 133 lb (60.3 kg)    BP 130/80   Pulse 64   Ht 5\' 3"  (1.6 m)   Wt 130 lb (59 kg)   BMI 23.03 kg/m    Assessment and Plan: 1. Chronic anxiety Chronic.  Controlled.  Stable.  Gad score is 0.  Continue Lexapro 20 mg once a day. - escitalopram (LEXAPRO) 20 MG tablet; Take 1 tablet (20 mg total) by mouth daily.  Dispense: 90 tablet; Refill: 1  2. Recurrent major depressive disorder, in partial remission ([place]) .  Controlled.  Stable.  PHQ score is 0.  We will continue  S-Citalopram 20 mg once a day. - escitalopram (LEXAPRO) 20 MG tablet; Take 1 tablet (20 mg total) by mouth daily.  Dispense: 90 tablet; Refill: 1  3. Essential hypertension Chronic.  Controlled.  Stable.  Continue losartan 100 mg once a day and amlodipine 2.5 mg daily.  Will check renal function panel for GFR concerns. - losartan (COZAAR) 100 MG tablet; Take 1 tablet (100  mg total) by mouth daily.  Dispense: 90 tablet; Refill: 1 - amLODipine (NORVASC) 2.5 MG tablet; Take 1 tablet (2.5 mg total) by mouth daily.  Dispense: 90 tablet; Refill: 1 - Renal Function Panel  4. Familial multiple lipoprotein-type hyperlipidemia Chronic.  Controlled.  Stable.  Continue atorvastatin 10 mg once a day.  Will check lipid panel and adjust accordingly. - atorvastatin (LIPITOR) 10 MG tablet; Take 1 tablet (10 mg total) by mouth daily.  Dispense: 90 tablet; Refill: 1 - Lipid Panel With LDL/HDL Ratio  5. Taking medication for chronic disease Patient taking medication which can affect hepatic function/i.e. statin.  Will check hepatic function panel for hepatotoxicity. - Hepatic Function Panel (6)

## 2019-11-16 DIAGNOSIS — M25662 Stiffness of left knee, not elsewhere classified: Secondary | ICD-10-CM | POA: Diagnosis not present

## 2019-11-16 DIAGNOSIS — Z9889 Other specified postprocedural states: Secondary | ICD-10-CM | POA: Diagnosis not present

## 2019-11-16 DIAGNOSIS — M6281 Muscle weakness (generalized): Secondary | ICD-10-CM | POA: Diagnosis not present

## 2019-11-16 DIAGNOSIS — M25562 Pain in left knee: Secondary | ICD-10-CM | POA: Diagnosis not present

## 2019-11-16 LAB — RENAL FUNCTION PANEL
Albumin: 5 g/dL — ABNORMAL HIGH (ref 3.7–4.7)
BUN/Creatinine Ratio: 14 (ref 12–28)
BUN: 10 mg/dL (ref 8–27)
CO2: 23 mmol/L (ref 20–29)
Calcium: 10.7 mg/dL — ABNORMAL HIGH (ref 8.7–10.3)
Chloride: 103 mmol/L (ref 96–106)
Creatinine, Ser: 0.72 mg/dL (ref 0.57–1.00)
GFR calc Af Amer: 94 mL/min/{1.73_m2} (ref >59)
GFR calc non Af Amer: 82 mL/min/{1.73_m2} (ref >59)
Glucose: 133 mg/dL — ABNORMAL HIGH (ref 65–99)
Phosphorus: 2.7 mg/dL — ABNORMAL LOW (ref 3.0–4.3)
Potassium: 4.5 mmol/L (ref 3.5–5.2)
Sodium: 141 mmol/L (ref 134–144)

## 2019-11-16 LAB — HEPATIC FUNCTION PANEL (6)
ALT: 12 IU/L (ref 0–32)
AST: 14 IU/L (ref 0–40)
Alkaline Phosphatase: 53 IU/L (ref 39–117)
Bilirubin Total: 0.5 mg/dL (ref 0.0–1.2)
Bilirubin, Direct: 0.15 mg/dL (ref 0.00–0.40)

## 2019-11-16 LAB — LIPID PANEL WITH LDL/HDL RATIO
Cholesterol, Total: 177 mg/dL (ref 100–199)
HDL: 63 mg/dL (ref >39)
LDL Chol Calc (NIH): 99 mg/dL (ref 0–99)
LDL/HDL Ratio: 1.6 ratio (ref 0.0–3.2)
Triglycerides: 81 mg/dL (ref 0–149)
VLDL Cholesterol Cal: 15 mg/dL (ref 5–40)

## 2019-11-20 DIAGNOSIS — M810 Age-related osteoporosis without current pathological fracture: Secondary | ICD-10-CM | POA: Diagnosis not present

## 2019-11-21 DIAGNOSIS — M25562 Pain in left knee: Secondary | ICD-10-CM | POA: Diagnosis not present

## 2019-11-21 DIAGNOSIS — Z9889 Other specified postprocedural states: Secondary | ICD-10-CM | POA: Diagnosis not present

## 2019-11-21 DIAGNOSIS — M6281 Muscle weakness (generalized): Secondary | ICD-10-CM | POA: Diagnosis not present

## 2019-11-21 DIAGNOSIS — M25662 Stiffness of left knee, not elsewhere classified: Secondary | ICD-10-CM | POA: Diagnosis not present

## 2019-11-27 DIAGNOSIS — M25662 Stiffness of left knee, not elsewhere classified: Secondary | ICD-10-CM | POA: Diagnosis not present

## 2019-11-27 DIAGNOSIS — Z9889 Other specified postprocedural states: Secondary | ICD-10-CM | POA: Diagnosis not present

## 2019-11-27 DIAGNOSIS — M25562 Pain in left knee: Secondary | ICD-10-CM | POA: Diagnosis not present

## 2019-11-27 DIAGNOSIS — M6281 Muscle weakness (generalized): Secondary | ICD-10-CM | POA: Diagnosis not present

## 2019-12-11 DIAGNOSIS — E119 Type 2 diabetes mellitus without complications: Secondary | ICD-10-CM | POA: Diagnosis not present

## 2019-12-11 DIAGNOSIS — H353131 Nonexudative age-related macular degeneration, bilateral, early dry stage: Secondary | ICD-10-CM | POA: Diagnosis not present

## 2019-12-11 LAB — HM DIABETES EYE EXAM

## 2019-12-12 ENCOUNTER — Other Ambulatory Visit: Payer: Self-pay

## 2019-12-12 NOTE — Progress Notes (Unsigned)
Put in eye exam

## 2019-12-18 ENCOUNTER — Other Ambulatory Visit: Payer: Self-pay

## 2020-02-02 DIAGNOSIS — E785 Hyperlipidemia, unspecified: Secondary | ICD-10-CM | POA: Diagnosis not present

## 2020-02-02 DIAGNOSIS — E1169 Type 2 diabetes mellitus with other specified complication: Secondary | ICD-10-CM | POA: Diagnosis not present

## 2020-02-02 DIAGNOSIS — M810 Age-related osteoporosis without current pathological fracture: Secondary | ICD-10-CM | POA: Diagnosis not present

## 2020-06-10 DIAGNOSIS — M810 Age-related osteoporosis without current pathological fracture: Secondary | ICD-10-CM | POA: Diagnosis not present

## 2020-06-10 DIAGNOSIS — E785 Hyperlipidemia, unspecified: Secondary | ICD-10-CM | POA: Diagnosis not present

## 2020-06-10 DIAGNOSIS — E1169 Type 2 diabetes mellitus with other specified complication: Secondary | ICD-10-CM | POA: Diagnosis not present

## 2020-06-10 LAB — HEMOGLOBIN A1C: Hemoglobin A1C: 7.2

## 2020-06-13 ENCOUNTER — Other Ambulatory Visit: Payer: Self-pay | Admitting: Family Medicine

## 2020-06-13 DIAGNOSIS — E7849 Other hyperlipidemia: Secondary | ICD-10-CM

## 2020-06-28 ENCOUNTER — Other Ambulatory Visit: Payer: Self-pay | Admitting: Family Medicine

## 2020-06-28 DIAGNOSIS — I10 Essential (primary) hypertension: Secondary | ICD-10-CM

## 2020-06-28 NOTE — Telephone Encounter (Signed)
Call to patient- appointment scheduled for follow up- RF granted

## 2020-07-02 ENCOUNTER — Telehealth: Payer: Self-pay

## 2020-07-02 ENCOUNTER — Other Ambulatory Visit: Payer: Self-pay

## 2020-07-02 ENCOUNTER — Ambulatory Visit (INDEPENDENT_AMBULATORY_CARE_PROVIDER_SITE_OTHER): Payer: Medicare Other | Admitting: Family Medicine

## 2020-07-02 ENCOUNTER — Encounter: Payer: Self-pay | Admitting: Family Medicine

## 2020-07-02 VITALS — BP 110/60 | HR 72 | Ht 63.0 in | Wt 133.0 lb

## 2020-07-02 DIAGNOSIS — I10 Essential (primary) hypertension: Secondary | ICD-10-CM | POA: Diagnosis not present

## 2020-07-02 DIAGNOSIS — F419 Anxiety disorder, unspecified: Secondary | ICD-10-CM | POA: Diagnosis not present

## 2020-07-02 DIAGNOSIS — F3341 Major depressive disorder, recurrent, in partial remission: Secondary | ICD-10-CM | POA: Diagnosis not present

## 2020-07-02 DIAGNOSIS — I723 Aneurysm of iliac artery: Secondary | ICD-10-CM

## 2020-07-02 DIAGNOSIS — E7849 Other hyperlipidemia: Secondary | ICD-10-CM

## 2020-07-02 DIAGNOSIS — I714 Abdominal aortic aneurysm, without rupture, unspecified: Secondary | ICD-10-CM

## 2020-07-02 DIAGNOSIS — Z780 Asymptomatic menopausal state: Secondary | ICD-10-CM

## 2020-07-02 MED ORDER — ESCITALOPRAM OXALATE 20 MG PO TABS: 20.0000 mg | ORAL_TABLET | Freq: Every day | ORAL | 1 refills | Status: DC

## 2020-07-02 MED ORDER — ATORVASTATIN CALCIUM 10 MG PO TABS: 10.0000 mg | ORAL_TABLET | Freq: Every day | ORAL | 1 refills | Status: DC

## 2020-07-02 MED ORDER — LOSARTAN POTASSIUM 100 MG PO TABS: 100.0000 mg | ORAL_TABLET | Freq: Every day | ORAL | 1 refills | Status: DC

## 2020-07-02 MED ORDER — AMLODIPINE BESYLATE 2.5 MG PO TABS: 2.5000 mg | ORAL_TABLET | Freq: Every day | ORAL | 1 refills | Status: DC

## 2020-07-02 NOTE — Telephone Encounter (Signed)
Copied from [place] [id number]. Topic: General - Inquiry >> Jul 02, 2020  3:07 PM Greggory Keen D wrote: Reason for CRM: Pt called saying she had a bone density scan at [facility] 11/20/19.  She said [facility] said they can not fax the results.

## 2020-07-02 NOTE — Progress Notes (Signed)
Date:  07/02/2020   Name:  Jill Guzman   DOB:  11/09/1940   MRN:  [id number]   Chief Complaint: Hypertension, Hyperlipidemia (lipid), Depression, and COPD  Hypertension This is a chronic problem. The current episode started more than 1 year ago. The problem is unchanged. The problem is controlled. Pertinent negatives include no anxiety, blurred vision, chest pain, headaches, malaise/fatigue, neck pain, orthopnea, palpitations, peripheral edema, PND, shortness of breath or sweats. There are no associated agents to hypertension. There are no known risk factors for coronary artery disease. Past treatments include angiotensin blockers. The current treatment provides moderate improvement. There are no compliance problems.  There is no history of angina, kidney disease, CAD/MI, CVA, heart failure, left ventricular hypertrophy, PVD or retinopathy. There is no history of chronic renal disease, a hypertension causing med or renovascular disease.  Hyperlipidemia This is a chronic problem. The current episode started more than 1 year ago. Recent lipid tests were reviewed and are normal. She has no history of chronic renal disease, diabetes, hypothyroidism, liver disease, obesity or nephrotic syndrome. Pertinent negatives include no chest pain, myalgias or shortness of breath. Current antihyperlipidemic treatment includes statins. The current treatment provides mild improvement of lipids. There are no compliance problems.  Risk factors for coronary artery disease include hypertension, dyslipidemia and diabetes mellitus.  Depression        This is a chronic problem.  The onset quality is gradual.   The problem has been gradually improving since onset.  Associated symptoms include no decreased concentration, no fatigue, no helplessness, no hopelessness, does not have insomnia, not irritable, no restlessness, no decreased interest, no appetite change, no body aches, no myalgias, no headaches, no indigestion,  not sad and no suicidal ideas.  Past treatments include SSRIs - Selective serotonin reuptake inhibitors.  Compliance with treatment is good.  Previous treatment provided moderate relief.   Pertinent negatives include no hypothyroidism and no anxiety. COPD She complains of cough. There is no chest tightness, difficulty breathing, frequent throat clearing, hemoptysis, hoarse voice, shortness of breath, sputum production or wheezing. Primary symptoms comments: Dyspnea on exertion. This is a chronic problem. The problem occurs intermittently. The problem has been waxing and waning. Pertinent negatives include no appetite change, chest pain, ear pain, fever, headaches, malaise/fatigue, myalgias, PND, rhinorrhea, sneezing, sore throat or sweats. Her past medical history is significant for COPD.    Lab Results  Component Value Date   CREATININE 0.72 11/15/2019   BUN 10 11/15/2019   NA 141 11/15/2019   K 4.5 11/15/2019   CL 103 11/15/2019   CO2 23 11/15/2019   Lab Results  Component Value Date   CHOL 177 11/15/2019   HDL 63 11/15/2019   LDLCALC 99 11/15/2019   TRIG 81 11/15/2019   CHOLHDL 2.9 03/29/2019   Lab Results  Component Value Date   TSH 1.650 03/29/2019   Lab Results  Component Value Date   HGBA1C 7.2 06/10/2020   Lab Results  Component Value Date   WBC 8.3 03/29/2019   HGB 14.1 03/29/2019   HCT 42.9 03/29/2019   MCV 85 03/29/2019   PLT 225 03/29/2019   Lab Results  Component Value Date   ALT 12 11/15/2019   AST 14 11/15/2019   ALKPHOS 53 11/15/2019   BILITOT 0.5 11/15/2019     Review of Systems  Constitutional: Negative.  Negative for appetite change, chills, fatigue, fever, malaise/fatigue and unexpected weight change.  HENT: Negative for congestion, ear discharge, ear pain,  hoarse voice, rhinorrhea, sinus pressure, sneezing and sore throat.   Eyes: Negative for blurred vision, photophobia, pain, discharge, redness and itching.  Respiratory: Positive for cough.  Negative for hemoptysis, sputum production, shortness of breath, wheezing and stridor.   Cardiovascular: Negative for chest pain, palpitations, orthopnea and PND.  Gastrointestinal: Negative for abdominal pain, blood in stool, constipation, diarrhea, nausea and vomiting.  Endocrine: Negative for cold intolerance, heat intolerance, polydipsia, polyphagia and polyuria.  Genitourinary: Negative for dysuria, flank pain, frequency, hematuria, menstrual problem, pelvic pain, urgency, vaginal bleeding and vaginal discharge.  Musculoskeletal: Negative for arthralgias, back pain, myalgias and neck pain.  Skin: Negative for rash.  Allergic/Immunologic: Negative for environmental allergies and food allergies.  Neurological: Negative for dizziness, weakness, light-headedness, numbness and headaches.  Hematological: Negative for adenopathy. Does not bruise/bleed easily.  Psychiatric/Behavioral: Positive for depression. Negative for decreased concentration, dysphoric mood and suicidal ideas. The patient is not nervous/anxious and does not have insomnia.     Patient Active Problem List   Diagnosis Date Noted  . Nonexudative age-related macular degeneration, bilateral, early dry stage 03/27/2019  . Recurrent major depressive disorder, in partial remission ([place]) 02/14/2018  . Chronic anxiety 02/09/2017  . AB (asthmatic bronchitis), mild intermittent, uncomplicated 19/50/9326  . Chronic obstructive pulmonary disease ([place]) 07/14/2016  . 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 Routine general medical examination at a health care facility 12/31/2014  . Diabetes ([place]) 12/31/2014  . BP (high blood pressure) 12/31/2014  . Osteopenia 12/31/2014    Allergies  Allergen Reactions  . Penicillins Itching  . Sulfa Antibiotics Itching    Past Surgical History:  Procedure Laterality Date  . ABDOMINAL HYSTERECTOMY      . BREAST EXCISIONAL BIOPSY Left 1976   neg  . CATARACT EXTRACTION W/ INTRAOCULAR LENS  IMPLANT, BILATERAL    . COLONOSCOPY  2014   normal  . cyst on bladder removed    . cyst removed breast Left   . KNEE ARTHROSCOPY WITH MEDIAL MENISECTOMY Left 10/20/2019   Procedure: KNEE ARTHROSCOPY WITH PARTIAL MEDIAL MENISECTOMY;  Surgeon: Leim Fabry, MD;  Location: [facility];  Service: Orthopedics;  Laterality: Left;  DIABETIC - oral meds  . PARTIAL HYSTERECTOMY    . TUBAL LIGATION      Social History   Tobacco Use  . Smoking status: Former Smoker    Packs/day: 2.00    Years: 10.00    Pack years: 20.00    Types: Cigarettes    Quit date: 05/11/1999    Years since quitting: 21.1  . Smokeless tobacco: Never Used  . Tobacco comment: Smoking cessation materials not required  Vaping Use  . Vaping Use: Never used  Substance Use Topics  . Alcohol use: No    Alcohol/week: 0.0 standard drinks  . Drug use: No     Medication list has been reviewed and updated.  Current Meds  Medication Sig  . acetaminophen (TYLENOL) 500 MG tablet Take 2 tablets (1,000 mg total) by mouth every 8 (eight) hours.  Marland Kitchen albuterol (VENTOLIN HFA) 108 (90 Base) MCG/ACT inhaler Inhale 1 puff into the lungs QID.  Marland Kitchen amLODipine (NORVASC) 2.5 MG tablet Take 1 tablet by mouth once daily  . atorvastatin (LIPITOR) 10 MG tablet Take 1 tablet by mouth once daily  . Cholecalciferol (VITAMIN D) 50 MCG (2000 UT) CAPS Take by mouth.  . escitalopram (LEXAPRO) 20 MG tablet Take 1 tablet (20 mg total) by mouth daily.  Marland Kitchen  glucose blood (ACCU-CHEK AVIVA PLUS) test strip USE ONE STRIP TO CHECK GLUCOSE ONCE DAILY  . losartan (COZAAR) 100 MG tablet Take 1 tablet (100 mg total) by mouth daily.  . metFORMIN (GLUCOPHAGE) 500 MG tablet Take 1 tablet (500 mg total) by mouth 2 (two) times daily. (Patient taking differently: Take 500 mg by mouth 2 (two) times daily with a meal. I tab bid)  . Omega 3 1000 MG CAPS Take 1 capsule (1,000 mg  total) by mouth daily.  Marland Kitchen spironolactone (ALDACTONE) 25 MG tablet Take 25 mg by mouth daily.    PHQ 2/9 Scores 07/02/2020 11/15/2019 10/16/2019 09/04/2019  PHQ - 2 Score 0 0 2 4  PHQ- 9 Score 0 0 6 10    GAD 7 : Generalized Anxiety Score 07/02/2020 11/15/2019 09/04/2019  Nervous, Anxious, on Edge 1 0 1  Control/stop worrying 0 0 1  Worry too much - different things 0 0 2  Trouble relaxing 0 0 0  Restless 0 0 0  Easily annoyed or irritable 0 0 0  Afraid - awful might happen 0 0 1  Total GAD 7 Score 1 0 5  Anxiety Difficulty Not difficult at all - Somewhat difficult    BP Readings from Last 3 Encounters:  07/02/20 110/60  11/15/19 130/80  10/20/19 (!) 150/77    Physical Exam Vitals and nursing note reviewed.  Constitutional:      General: She is not irritable.She is not in acute distress.    Appearance: She is not diaphoretic.  HENT:     Head: Normocephalic and atraumatic.     Right Ear: Tympanic membrane, ear canal and external ear normal.     Left Ear: Tympanic membrane, ear canal and external ear normal.     Nose: Nose normal.  Eyes:     General:        Right eye: No discharge.        Left eye: No discharge.     Conjunctiva/sclera: Conjunctivae normal.     Pupils: Pupils are equal, round, and reactive to light.  Neck:     Thyroid: No thyromegaly.     Vascular: No JVD.  Cardiovascular:     Rate and Rhythm: Normal rate and regular rhythm.     Pulses:          Dorsalis pedis pulses are 2+ on the right side and 2+ on the left side.       Posterior tibial pulses are 2+ on the right side and 2+ on the left side.     Heart sounds: Normal heart sounds. No murmur heard.  No friction rub. No gallop.   Pulmonary:     Effort: Pulmonary effort is normal.     Breath sounds: Normal breath sounds.  Abdominal:     General: Bowel sounds are normal.     Palpations: Abdomen is soft. There is no mass.     Tenderness: There is no abdominal tenderness. There is no guarding.    Musculoskeletal:        General: Normal range of motion.     Cervical back: Normal range of motion and neck supple.  Feet:     Right foot:     Protective Sensation: 10 sites tested. 10 sites sensed.     Skin integrity: Dry skin present. No ulcer, blister, skin breakdown, erythema, warmth, callus or fissure.     Toenail Condition: Right toenails are abnormally thick.     Left foot:  Protective Sensation: 10 sites tested. 10 sites sensed.     Skin integrity: Dry skin present. No ulcer, blister, skin breakdown, erythema, warmth, callus or fissure.     Toenail Condition: Left toenails are abnormally thick.  Lymphadenopathy:     Cervical: No cervical adenopathy.  Skin:    General: Skin is warm and dry.  Neurological:     Mental Status: She is alert.     Deep Tendon Reflexes: Reflexes are normal and symmetric.     Wt Readings from Last 3 Encounters:  07/02/20 133 lb (60.3 kg)  11/15/19 130 lb (59 kg)  10/20/19 132 lb (59.9 kg)    BP 110/60   Pulse 72   Ht 5\' 3"  (1.6 m)   Wt 133 lb (60.3 kg)   BMI 23.56 kg/m   Assessment and Plan: 1. Essential hypertension Chronic.  Controlled.  Stable.  Continue amlodipine 2.5 mg once a day and losartan 100 mg once a day. - amLODipine (NORVASC) 2.5 MG tablet; Take 1 tablet (2.5 mg total) by mouth daily.  Dispense: 90 tablet; Refill: 1 - losartan (COZAAR) 100 MG tablet; Take 1 tablet (100 mg total) by mouth daily.  Dispense: 90 tablet; Refill: 1 - Lipid Panel With LDL/HDL Ratio  2. Familial multiple lipoprotein-type hyperlipidemia Chronic.  Controlled.  Stable.  Continue Lipitor 10 mg once a day.  Will check lipid panel. - atorvastatin (LIPITOR) 10 MG tablet; Take 1 tablet (10 mg total) by mouth daily.  Dispense: 90 tablet; Refill: 1 - Lipid Panel With LDL/HDL Ratio  3. Chronic anxiety Chronic.  Controlled.  Stable.  PHQ is 0 Gad score is 1.  Patient will continue S-Citalopram 20 mg once a day. - escitalopram (LEXAPRO) 20 MG tablet;  Take 1 tablet (20 mg total) by mouth daily.  Dispense: 90 tablet; Refill: 1  4. Recurrent major depressive disorder, in partial remission (HCC) Chronic.  Controlled.  Stable.  PHQ score is 0.  We will continue S-Citalopram 20 mg once a day. - escitalopram (LEXAPRO) 20 MG tablet; Take 1 tablet (20 mg total) by mouth daily.  Dispense: 90 tablet; Refill: 1  5. Iliac artery aneurysm, left (HCC) On review of patient's imaging it was noted that there was a saccular aneurysm of either the abdominal aorta or left iliac.  We will obtain a lower extremity ultrasound of the iliac artery to further assess aneurysm. - [place] Lower Ext Art Bilat; Future  6. Abdominal aortic aneurysm (AAA) without rupture ([place]) As noted in May 2020 patient had a saccular aneurysm of either the aorta or the left iliac.  We will obtain an ultrasound to further evaluate size of the aneurysm which was last measured I believe at 3.4 cm. - US AORTA; Future  7. Menopause Patient has menopause and needs to have bone density to assess for osteopenia/osteoporosis. - DG Bone Density; Future

## 2020-07-02 NOTE — Telephone Encounter (Signed)
Spoke to pt concerning [facility]- they will send fax

## 2020-07-02 NOTE — Telephone Encounter (Signed)
Copied from [place] [id number]. Topic: General - Inquiry >> Jul 02, 2020  1:26 PM Gillis Ends D wrote: Reason for CRM: Patient would like to speak to [facility]. She can be reached at [phone]. Please advise

## 2020-07-03 LAB — LIPID PANEL WITH LDL/HDL RATIO
Cholesterol, Total: 195 mg/dL (ref 100–199)
HDL: 52 mg/dL (ref >39)
LDL Chol Calc (NIH): 122 mg/dL — ABNORMAL HIGH (ref 0–99)
LDL/HDL Ratio: 2.3 ratio (ref 0.0–3.2)
Triglycerides: 115 mg/dL (ref 0–149)
VLDL Cholesterol Cal: 21 mg/dL (ref 5–40)

## 2020-07-09 ENCOUNTER — Ambulatory Visit
Admission: RE | Admit: 2020-07-09 | Discharge: 2020-07-09 | Disposition: A | Discharge status: Home / Self Care | Payer: Medicare Other | Source: Ambulatory Visit | Attending: Family Medicine | Admitting: Family Medicine

## 2020-07-09 ENCOUNTER — Other Ambulatory Visit: Payer: Self-pay

## 2020-07-09 ENCOUNTER — Telehealth: Payer: Self-pay

## 2020-07-09 DIAGNOSIS — I714 Abdominal aortic aneurysm, without rupture, unspecified: Secondary | ICD-10-CM

## 2020-07-09 DIAGNOSIS — I723 Aneurysm of iliac artery: Secondary | ICD-10-CM | POA: Diagnosis not present

## 2020-07-09 DIAGNOSIS — I700 Atherosclerosis of aorta: Secondary | ICD-10-CM | POA: Diagnosis not present

## 2020-07-09 DIAGNOSIS — I70203 Unspecified atherosclerosis of native arteries of extremities, bilateral legs: Secondary | ICD-10-CM | POA: Diagnosis not present

## 2020-07-09 HISTORY — DX: Aneurysm of iliac artery: I72.3

## 2020-07-09 NOTE — Progress Notes (Signed)
Put referral to V and V in

## 2020-07-09 NOTE — Telephone Encounter (Signed)
Called pt and discussed needing to see vein and vascular

## 2020-07-09 NOTE — Telephone Encounter (Signed)
Left message for Jill Guzman to call Jill Guzman back concerning the need for a vascular consultation for aneurysm on Jill Guzman. I put in referral to Eulogio Ditch

## 2020-07-09 NOTE — Telephone Encounter (Signed)
Pt returning your call.  Called office. advised pt Jill Guzman will call her back.

## 2020-07-11 DIAGNOSIS — M810 Age-related osteoporosis without current pathological fracture: Secondary | ICD-10-CM | POA: Diagnosis not present

## 2020-07-23 ENCOUNTER — Encounter (INDEPENDENT_AMBULATORY_CARE_PROVIDER_SITE_OTHER): Payer: Self-pay | Admitting: Vascular Surgery

## 2020-07-23 ENCOUNTER — Other Ambulatory Visit: Payer: Self-pay

## 2020-07-23 ENCOUNTER — Ambulatory Visit (INDEPENDENT_AMBULATORY_CARE_PROVIDER_SITE_OTHER): Payer: Medicare Other | Admitting: Vascular Surgery

## 2020-07-23 VITALS — BP 166/96 | HR 90 | Ht 63.0 in | Wt 134.0 lb

## 2020-07-23 DIAGNOSIS — I723 Aneurysm of iliac artery: Secondary | ICD-10-CM

## 2020-07-23 DIAGNOSIS — I713 Abdominal aortic aneurysm, ruptured, unspecified: Secondary | ICD-10-CM

## 2020-07-23 DIAGNOSIS — I10 Essential (primary) hypertension: Secondary | ICD-10-CM

## 2020-07-23 DIAGNOSIS — E119 Type 2 diabetes mellitus without complications: Secondary | ICD-10-CM

## 2020-07-23 NOTE — Assessment & Plan Note (Addendum)
duplex ultrasound that I have been able to independently reviewed.  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  My review of this would be similar although I do believe this is probably the iliac artery.  It is a little hard to say without real-time imaging. We discussed the pathophysiology and natural history of iliac and abdominal aortic aneurysms. We discussed at 4.2 cm, a AAA would not need repaired but an iliac aneurysm would need to be repaired.  She needs a CT scan for further evaluation and to determine treatment options.  This will be done in the near future at her convenience and we will review and discuss with the patient.

## 2020-07-23 NOTE — Patient Instructions (Signed)
Abdominal Aortic Aneurysm  An aneurysm is a bulge in one of the blood vessels that carry blood away from the heart (artery). It happens when blood pushes up against a weak or damaged place in the wall of an artery. An abdominal aortic aneurysm happens in the main artery of the body (aorta). Some aneurysms may not cause problems. If it grows, it can burst or tear, causing bleeding inside the body. This is an emergency. It needs to be treated right away. What are the causes? The exact cause of this condition is not known. What increases the risk? The following may make you more likely to get this condition:  Being a female who is 77 years of age or older.  Being white (Caucasian).  Using tobacco.  Having a family history of aneurysms.  Having the following conditions: ? Hardening of the arteries (arteriosclerosis). ? Inflammation of the walls of an artery (arteritis). ? Certain genetic conditions. ? Being very overweight (obesity). ? An infection in the wall of the aorta (infectious aortitis). ? High cholesterol. ? High blood pressure (hypertension). What are the signs or symptoms? Symptoms depend on the size of the aneurysm and how fast it is growing. Most grow slowly and do not cause any symptoms. If symptoms do occur, they may include:  Pain in the belly (abdomen), side, or back.  Feeling full after eating only small amounts of food.  Feeling a throbbing lump in the belly. Symptoms that the aneurysm has burst (ruptured) include:  Sudden, very bad pain in the belly, side, or back.  Feeling sick to your stomach (nauseous).  Throwing up (vomiting).  Feeling light-headed or passing out. How is this treated? Treatment for this condition depends on:  The size of the aneurysm.  How fast it is growing.  Your age.  Your risk of having it burst. If your aneurysm is smaller than 2 inches (5 cm), your doctor may manage it by:  Checking it often to see if it is getting bigger.  You may have an imaging test (ultrasound) to check it every 3-6 months, every year, or every few years.  Giving you medicines to: ? Control blood pressure. ? Treat pain. ? Fight infection. If your aneurysm is larger than 2 inches (5 cm), you may need surgery to fix it. Follow these instructions at home: Lifestyle  Do not use any products that have nicotine or tobacco in them. This includes cigarettes, e-cigarettes, and chewing tobacco. If you need help quitting, ask your doctor.  Get regular exercise. Ask your doctor what types of exercise are best for you. Eating and drinking  Eat a heart-healthy diet. This includes eating plenty of: ? Fresh fruits and vegetables. ? Whole grains. ? Low-fat (lean) protein. ? Low-fat dairy products.  Avoid foods that are high in saturated fat and cholesterol. These foods include red meat and some dairy products.  Do not drink alcohol if: ? Your doctor tells you not to drink. ? You are pregnant, may be pregnant, or are planning to become pregnant.  If you drink alcohol: ? Limit how much you use to:  0-1 drink a day for women.  0-2 drinks a day for men. ? Be aware of how much alcohol is in your drink. In the [place] one drink equals any of these:  One typical bottle of beer (12 oz).  One-half glass of wine (5 oz).  One shot of hard liquor (1 oz). General instructions  Take over-the-counter and prescription medicines only as  told by your doctor.  Keep your blood pressure within normal limits. Ask your doctor what your blood pressure should be.  Have your blood sugar (glucose) level and cholesterol levels checked regularly. Keep your blood sugar level and cholesterol levels within normal limits.  Avoid heavy lifting and activities that take a lot of effort. Ask your doctor what activities are safe for you.  Keep all follow-up visits as told by your doctor. This is important. ? Talk to your doctor about regular screenings to see if the  aneurysm is getting bigger. Contact a doctor if you:  Have pain in your belly, side, or back.  Have a throbbing feeling in your belly.  Have a family history of aneurysms. Get help right away if you:  Have sudden, bad pain in your belly, side, or back.  Feel sick to your stomach.  Throw up.  Have trouble pooping (constipation).  Have trouble peeing (urinating).  Feel light-headed.  Have a fast heart rate when you stand.  Have sweaty skin that is cold to the touch (clammy).  Have shortness of breath.  Have a fever. These symptoms may be an emergency. Do not wait to see if the symptoms will go away. Get medical help right away. Call your local emergency services (911 in the [place] Do not drive yourself to the hospital. Summary  An aneurysm is a bulge in one of the blood vessels that carry blood away from the heart (artery). Some aneurysms may not cause problems.  You may need to have yours checked often. If it grows, it can burst or tear. This causes bleeding inside the body. It needs to be treated right away.  Follow instructions from your doctor about healthy lifestyle changes.  Keep all follow-up visits as told by your doctor. This is important. This information is not intended to replace advice given to you by your health care provider. Make sure you discuss any questions you have with your health care provider. Document Revised: 12/12/2018 Document Reviewed: 04/02/2018 Elsevier Patient Education  [place].

## 2020-07-23 NOTE — Progress Notes (Signed)
Patient ID: Jill Guzman, female   DOB: 1941/03/31, 79 y.o.   MRN: [id number]  Chief Complaint  Patient presents with  . New Patient (Initial Visit)    Jill Guzman. aaa    HPI Jill Guzman is a 79 y.o. female.  I am asked to see the patient by Dr. 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  The patient was having some syncopal episodes or near syncope as well as some dizziness.  She has not had any abdominal pain, back pain, or signs of peripheral embolization.  This was initially seen on an MRI study suggesting this, but she recently underwent a duplex ultrasound that I have been able to independently reviewed.  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  My review of this would be similar although I do believe this is probably the iliac artery.  It is a little hard to say without real-time imaging.  Given these findings, she is referred for further evaluation and treatment.     Past Medical History:  Diagnosis Date  . Anxiety   . Asthma   . Bell's palsy    age 38 and age 66   . COPD (chronic obstructive pulmonary disease) ([place])   . Deaf, right   . Depression   . Diabetes mellitus without complication ([place])   . Hearing aid worn    left  . Hyperlipidemia   . Hypertension   . Varicose veins of legs   . Vertigo    random, approx 1x/month  . Wears dentures    full upper and lower    Past Surgical History:  Procedure Laterality Date  . ABDOMINAL HYSTERECTOMY    . BREAST EXCISIONAL BIOPSY Left 1976   neg  . CATARACT EXTRACTION W/ INTRAOCULAR LENS  IMPLANT, BILATERAL    . COLONOSCOPY  2014   normal  . cyst on bladder removed    . cyst removed breast Left   . KNEE ARTHROSCOPY WITH MEDIAL MENISECTOMY Left 10/20/2019   Procedure: KNEE ARTHROSCOPY WITH PARTIAL MEDIAL MENISECTOMY;  Surgeon: Leim Fabry, MD;  Location: [facility];   Service: Orthopedics;  Laterality: Left;  DIABETIC - oral meds  . PARTIAL HYSTERECTOMY    . TUBAL LIGATION       Family History  Problem Relation Age of Onset  . Diabetes Mother   . Stroke Mother   . Healthy Daughter   . Cancer Son 40       lung  . Hypertension Son   . Rheum arthritis Daughter   . Hypertension Daughter   . Arthritis Daughter   . COPD Son   . Breast cancer Neg Hx       Social History   Tobacco Use  . Smoking status: Former Smoker    Packs/day: 2.00    Years: 10.00    Pack years: 20.00    Types: Cigarettes    Quit date: 05/11/1999    Years since quitting: 21.2  . Smokeless tobacco: Never Used  . Tobacco comment: Smoking cessation materials not required  Vaping Use  . Vaping Use: Never used  Substance Use Topics  . Alcohol use: No    Alcohol/week: 0.0 standard drinks  . Drug use: No    Allergies  Allergen Reactions  . Penicillins Itching  . Sulfa Antibiotics Itching    Current Outpatient Medications  Medication Sig Dispense Refill  . acetaminophen (TYLENOL) 500 MG tablet Take 2 tablets (1,000 mg total) by mouth every 8 (eight) hours. 90 tablet 2  . albuterol (VENTOLIN HFA) 108 (90 Base) MCG/ACT inhaler Inhale 1 puff into the lungs QID. 6.7 g 11  . amLODipine (NORVASC) 2.5 MG tablet Take 1 tablet (2.5 mg total) by mouth daily. 90 tablet 1  . atorvastatin (LIPITOR) 10 MG tablet Take 1 tablet (10 mg total) by mouth daily. 90 tablet 1  . Cholecalciferol (VITAMIN D) 50 MCG (2000 UT) CAPS Take by mouth.    Marland Kitchen glucose blood (ACCU-CHEK AVIVA PLUS) test strip USE ONE STRIP TO CHECK GLUCOSE ONCE DAILY 50 each 11  . losartan (COZAAR) 100 MG tablet Take 1 tablet (100 mg total) by mouth daily. 90 tablet 1  . metFORMIN (GLUCOPHAGE) 500 MG tablet Take 1 tablet (500 mg total) by mouth 2 (two) times daily. (Patient taking differently: Take 500 mg by mouth 2 (two) times daily with a meal. I tab bid) 180 tablet 6  . Omega 3 1000 MG CAPS Take 1 capsule (1,000 mg  total) by mouth daily.    Marland Kitchen spironolactone (ALDACTONE) 25 MG tablet Take 25 mg by mouth daily.    Marland Kitchen escitalopram (LEXAPRO) 20 MG tablet Take 1 tablet (20 mg total) by mouth daily. (Patient not taking: Reported on 07/23/2020) 90 tablet 1   No current facility-administered medications for this visit.      REVIEW OF SYSTEMS (Negative unless checked)  Constitutional: [] Weight loss  [] Fever  [] Chills Cardiac: [] Chest pain   [] Chest pressure   [] Palpitations   [] Shortness of breath when laying flat   [] Shortness of breath at rest   [] Shortness of breath with exertion. Vascular:  [] Pain in legs with walking   [] Pain in legs at rest   [] Pain in legs when laying flat   [] Claudication   [] Pain in feet when walking  [] Pain in feet at rest  [] Pain in feet when laying flat   [] History of DVT   [] Phlebitis   [] Swelling in legs   [x] Varicose veins   [] Non-healing ulcers Pulmonary:   [] Uses home oxygen   [] Productive cough   [] Hemoptysis   [] Wheeze  [] COPD   [x] Asthma Neurologic:  [x] Dizziness  [] Blackouts   [] Seizures   [] History of stroke   [] History of TIA  [] Aphasia   [] Temporary blindness   [] Dysphagia   [] Weakness or numbness in arms   [] Weakness or numbness in legs Musculoskeletal:  [x] Arthritis   [] Joint swelling   [] Joint pain   [] Low back pain Hematologic:  [] Easy bruising  [] Easy bleeding   [] Hypercoagulable state   [] Anemic  [] Hepatitis Gastrointestinal:  [] Blood in stool   [] Vomiting blood  [] Gastroesophageal reflux/heartburn   [] Abdominal pain Genitourinary:  [] Chronic kidney disease   [] Difficult urination  [] Frequent urination  [] Burning with urination   [] Hematuria Skin:  [] Rashes   [] Ulcers   [] Wounds Psychological:  [x] History of anxiety   []  History of major depression.    Physical Exam BP (!) 166/96   Pulse 90   Ht 5\' 3"  (1.6 m)   Wt 134 lb (60.8 kg)   BMI 23.74 kg/m  Gen:  WD/WN, NAD. Appears younger than stated age. Head: [place]/AT, No temporalis wasting.  Ear/Nose/Throat:  Hearing grossly intact, nares w/o erythema or drainage, oropharynx w/o Erythema/Exudate Eyes: Conjunctiva clear, sclera non-icteric  Neck: trachea midline.  No JVD.  Pulmonary:  Good air movement, respirations not labored, no use of accessory muscles  Cardiac: RRR, no JVD Vascular:  Vessel Right Left  Radial Palpable Palpable                          DP Palpable Palpable  PT Palpable Palpable   Gastrointestinal:. No masses, surgical incisions, or scars. Increased aortic impulse Musculoskeletal: M/S 5/5 throughout.  Extremities without ischemic changes.  No deformity or atrophy. No edema. Neurologic: Sensation grossly intact in extremities.  Symmetrical.  Speech is fluent. Motor exam as listed above. Psychiatric: Judgment intact, Mood & affect appropriate for pt's clinical situation. Dermatologic: No rashes or ulcers noted.  No cellulitis or open wounds.    Radiology US AORTA DUPLEX LIMITED  Result Date: 07/09/2020 CLINICAL DATA:  Lumbar x-ray demonstrated aortic and left iliac calcification EXAM: ULTRASOUND OF ABDOMINAL AORTA TECHNIQUE: Ultrasound examination of the abdominal aorta and proximal common iliac arteries was performed to evaluate for aneurysm. Additional color and Doppler images of the distal aorta were obtained to document patency. COMPARISON:  Lumbar radiographs-02/04/2019; lumbar spine MRI-02/04/2019 FINDINGS: Abdominal aortic measurements as follows: Proximal:  3.0 x 2 cm Mid:  2.3 x 1.9 cm Distal:  1.8 x 1.9 cm Patent: Yes, peak systolic velocity is 66 cm/s Scattered eccentric mixed echogenic plaque is seen throughout the abdominal aorta. Right common iliac artery: 0.8 x 0.8 cm Left common iliac artery: 4.2 x 4.2 cm (note, is unclear whether the aneurysm involves the distal aspect of the abdominal aorta (as was suggested on previous lumbar spine MRI) versus the left common iliac artery (as was suggested on this ultrasound). IMPRESSION: 1. Fusiform aneurysmal dilatation of  either the distal aspect of the abdominal aorta or the left common iliac artery measuring 4.2 cm in diameter - CTA of the abdomen and pelvis and referral to vascular surgery is advised. 2. Aortic Atherosclerosis (ICD10-I70.0). Electronically Signed   By: Sandi Mariscal M.D.   On: 07/09/2020 12:37   [place] Lower Ext Art Bilat  Result Date: 07/09/2020 CLINICAL DATA:  79 year old female with a history of iliac artery aneurysm EXAM: BILATERAL LOWER EXTREMITY ARTERIAL DUPLEX SCAN TECHNIQUE: Gray-scale sonography as well as color Doppler and duplex ultrasound was performed to evaluate the arteries of both lower extremities including the common, superficial and profunda femoral arteries, popliteal artery and calf arteries. COMPARISON:  None. FINDINGS: Right Lower Extremity Inflow: Normal common femoral arterial waveforms and velocities. No evidence of inflow (aortoiliac) disease. Outflow: Normal profunda femoral, superficial femoral and popliteal arterial waveforms and velocities. No focal elevation of the PSV to suggest stenosis. Runoff: Normal posterior and anterior tibial arterial waveforms and velocities. Vessels are patent to the ankle. Left Lower Extremity Inflow: Normal common femoral arterial waveforms and velocities. No evidence of inflow (aortoiliac) disease. Outflow: Normal profunda femoral, superficial femoral and popliteal arterial waveforms and velocities. No focal elevation of the PSV to suggest stenosis. Runoff: Normal posterior and anterior tibial arterial waveforms and velocities. Vessels are patent to the ankle. IMPRESSION: Mild scattered atherosclerotic plaque without evidence of hemodynamically significant stenosis or occlusion. No aneurysmal disease identified in either lower extremity. Signed, Criselda Peaches, MD, [facility] Radiology Electronically Signed   By: Jacqulynn Cadet M.D.   On: 07/09/2020 12:41    Labs Recent Results (from the  past 2160 hour(s))  Hemoglobin A1c     Status: None   Collection Time: 06/10/20 12:00 AM  Result Value Ref Range   Hemoglobin A1C 7.2   Lipid Panel With LDL/HDL Ratio  Status: Abnormal   Collection Time: 07/02/20 10:49 AM  Result Value Ref Range   Cholesterol, Total 195 100 - 199 mg/dL   Triglycerides 115 0 - 149 mg/dL   HDL 52 >39 mg/dL   VLDL Cholesterol Cal 21 5 - 40 mg/dL   LDL Chol Calc (NIH) 122 (H) 0 - 99 mg/dL   LDL/HDL Ratio 2.3 0.0 - 3.2 ratio    Comment:                                     LDL/HDL Ratio                                             Men  Women                               1/2 Avg.Risk  1.0    1.5                                   Avg.Risk  3.6    3.2                                2X Avg.Risk  6.2    5.0                                3X Avg.Risk  8.0    6.1     Assessment/Plan:  Iliac artery aneurysm (HCC)  duplex ultrasound that I have been able to independently reviewed.  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  My review of this would be similar although I do believe this is probably the iliac artery.  It is a little hard to say without real-time imaging. We discussed the pathophysiology and natural history of iliac and abdominal aortic aneurysms. We discussed at 4.2 cm, a AAA would not need repaired but an iliac aneurysm would need to be repaired.  She needs a CT scan for further evaluation and to determine treatment options.  This will be done in the near future at her convenience and we will review and discuss with the patient.   Diabetes blood glucose control important in reducing the progression of atherosclerotic disease. Also, involved in wound healing. On appropriate medications.   BP (high blood pressure) blood pressure control important in reducing the progression of atherosclerotic disease and aneurysmal growth. On appropriate oral  medications.       Leotis Pain 07/23/2020, 12:58 PM   This note was created with Dragon medical transcription system.  Any errors from dictation are unintentional.

## 2020-07-23 NOTE — Assessment & Plan Note (Signed)
blood pressure control important in reducing the progression of atherosclerotic disease and aneurysmal growth. On appropriate oral medications.

## 2020-07-23 NOTE — Assessment & Plan Note (Signed)
blood glucose control important in reducing the progression of atherosclerotic disease. Also, involved in wound healing. On appropriate medications.  

## 2020-07-25 ENCOUNTER — Other Ambulatory Visit: Payer: Medicare Other

## 2020-07-30 ENCOUNTER — Other Ambulatory Visit (INDEPENDENT_AMBULATORY_CARE_PROVIDER_SITE_OTHER): Payer: Self-pay | Admitting: Nurse Practitioner

## 2020-07-30 DIAGNOSIS — I723 Aneurysm of iliac artery: Secondary | ICD-10-CM

## 2020-07-31 ENCOUNTER — Other Ambulatory Visit
Admission: RE | Admit: 2020-07-31 | Discharge: 2020-07-31 | Disposition: A | Discharge status: Home / Self Care | Payer: Medicare Other | Source: Home / Self Care | Attending: Nurse Practitioner | Admitting: Nurse Practitioner

## 2020-07-31 ENCOUNTER — Ambulatory Visit
Admission: RE | Admit: 2020-07-31 | Discharge: 2020-07-31 | Disposition: A | Discharge status: Home / Self Care | Payer: Medicare Other | Source: Ambulatory Visit | Attending: Vascular Surgery | Admitting: Vascular Surgery

## 2020-07-31 ENCOUNTER — Other Ambulatory Visit: Payer: Self-pay

## 2020-07-31 DIAGNOSIS — I723 Aneurysm of iliac artery: Secondary | ICD-10-CM | POA: Insufficient documentation

## 2020-07-31 DIAGNOSIS — I708 Atherosclerosis of other arteries: Secondary | ICD-10-CM | POA: Diagnosis not present

## 2020-07-31 DIAGNOSIS — I713 Abdominal aortic aneurysm, ruptured, unspecified: Secondary | ICD-10-CM

## 2020-07-31 DIAGNOSIS — I714 Abdominal aortic aneurysm, without rupture, unspecified: Secondary | ICD-10-CM

## 2020-07-31 DIAGNOSIS — I722 Aneurysm of renal artery: Secondary | ICD-10-CM | POA: Diagnosis not present

## 2020-07-31 DIAGNOSIS — I70203 Unspecified atherosclerosis of native arteries of extremities, bilateral legs: Secondary | ICD-10-CM | POA: Diagnosis not present

## 2020-07-31 HISTORY — DX: Abdominal aortic aneurysm, without rupture, unspecified: I71.40

## 2020-07-31 LAB — CREATININE, SERUM
Creatinine, Ser: 0.67 mg/dL (ref 0.44–1.00)
GFR, Estimated: >60 mL/min (ref >60)

## 2020-07-31 MED ORDER — IOHEXOL 350 MG/ML SOLN
100.0000 mL | Freq: Once | INTRAVENOUS | Status: AC | PRN
  Administered 2020-07-31: 100 mL via INTRAVENOUS

## 2020-08-09 ENCOUNTER — Encounter (INDEPENDENT_AMBULATORY_CARE_PROVIDER_SITE_OTHER): Payer: Self-pay | Admitting: Vascular Surgery

## 2020-08-09 ENCOUNTER — Ambulatory Visit (INDEPENDENT_AMBULATORY_CARE_PROVIDER_SITE_OTHER): Payer: Medicare Other | Admitting: Vascular Surgery

## 2020-08-09 ENCOUNTER — Other Ambulatory Visit: Payer: Self-pay

## 2020-08-09 VITALS — BP 187/95 | HR 86 | Resp 16 | Wt 133.6 lb

## 2020-08-09 DIAGNOSIS — I714 Abdominal aortic aneurysm, without rupture, unspecified: Secondary | ICD-10-CM

## 2020-08-09 DIAGNOSIS — I10 Essential (primary) hypertension: Secondary | ICD-10-CM

## 2020-08-09 DIAGNOSIS — E119 Type 2 diabetes mellitus without complications: Secondary | ICD-10-CM

## 2020-08-09 NOTE — Progress Notes (Signed)
MRN : [id number]  Jill Guzman is a 79 y.o. (17-Apr-1941) female who presents with chief complaint of  Chief Complaint  Patient presents with   Follow-up    ct results  .  History of Present Illness: Patient returns today in follow up of her aneurysm disease.  She has undergone a CT angiogram of the abdomen pelvis which I have independently reviewed.  This is clearly an abdominal aortic aneurysm with no aneurysmal involvement of the iliac arteries although there is some atherosclerotic occlusive disease in the iliac arteries.  The aneurysm measures approximately 4.5 cm in maximal diameter and is located in the distal aorta.  There is no evidence of dissection or extravasation.  Current Outpatient Medications  Medication Sig Dispense Refill   acetaminophen (TYLENOL) 500 MG tablet Take 2 tablets (1,000 mg total) by mouth every 8 (eight) hours. 90 tablet 2   albuterol (VENTOLIN HFA) 108 (90 Base) MCG/ACT inhaler Inhale 1 puff into the lungs QID. 6.7 g 11   amLODipine (NORVASC) 2.5 MG tablet Take 1 tablet (2.5 mg total) by mouth daily. 90 tablet 1   atorvastatin (LIPITOR) 10 MG tablet Take 1 tablet (10 mg total) by mouth daily. 90 tablet 1   Cholecalciferol (VITAMIN D) 50 MCG (2000 UT) CAPS Take by mouth.     glucose blood (ACCU-CHEK AVIVA PLUS) test strip USE ONE STRIP TO CHECK GLUCOSE ONCE DAILY 50 each 11   losartan (COZAAR) 100 MG tablet Take 1 tablet (100 mg total) by mouth daily. 90 tablet 1   metFORMIN (GLUCOPHAGE) 500 MG tablet Take 1 tablet (500 mg total) by mouth 2 (two) times daily. (Patient taking differently: Take 500 mg by mouth 2 (two) times daily with a meal. I tab bid) 180 tablet 6   Omega 3 1000 MG CAPS Take 1 capsule (1,000 mg total) by mouth daily.     spironolactone (ALDACTONE) 25 MG tablet Take 25 mg by mouth daily.     escitalopram (LEXAPRO) 20 MG tablet Take 1 tablet (20 mg total) by mouth daily. (Patient not taking: Reported on 07/23/2020) 90 tablet 1    No current facility-administered medications for this visit.    Past Medical History:  Diagnosis Date   Anxiety    Asthma    Bell's palsy    age 26 and age 39    COPD (chronic obstructive pulmonary disease) ([place])    Deaf, right    Depression    Diabetes mellitus without complication ([place])    Hearing aid worn    left   Hyperlipidemia    Hypertension    Varicose veins of legs    Vertigo    random, approx 1x/month   Wears dentures    full upper and lower    Past Surgical History:  Procedure Laterality Date   ABDOMINAL HYSTERECTOMY     BREAST EXCISIONAL BIOPSY Left 1976   neg   CATARACT EXTRACTION W/ INTRAOCULAR LENS  IMPLANT, BILATERAL     COLONOSCOPY  2014   normal   cyst on bladder removed     cyst removed breast Left    KNEE ARTHROSCOPY WITH MEDIAL MENISECTOMY Left 10/20/2019   Procedure: KNEE ARTHROSCOPY WITH PARTIAL MEDIAL MENISECTOMY;  Surgeon: Leim Fabry, MD;  Location: [facility];  Service: Orthopedics;  Laterality: Left;  DIABETIC - oral meds   PARTIAL HYSTERECTOMY     TUBAL LIGATION       Social History   Tobacco Use   Smoking status: Former  Smoker    Packs/day: 2.00    Years: 10.00    Pack years: 20.00    Types: Cigarettes    Quit date: 05/11/1999    Years since quitting: 21.2   Smokeless tobacco: Never Used   Tobacco comment: Smoking cessation materials not required  Vaping Use   Vaping Use: Never used  Substance Use Topics   Alcohol use: No    Alcohol/week: 0.0 standard drinks   Drug use: No      Family History  Problem Relation Age of Onset   Diabetes Mother    Stroke Mother    Healthy Daughter    Cancer Son 49       lung   Hypertension Son    Rheum arthritis Daughter    Hypertension Daughter    Arthritis Daughter    COPD Son    Breast cancer Neg Hx      Allergies  Allergen Reactions   Penicillins Itching   Sulfa Antibiotics Itching    REVIEW OF SYSTEMS (Negative unless  checked)  Constitutional: [] ?Weight loss  [] ?Fever  [] ?Chills Cardiac: [] ?Chest pain   [] ?Chest pressure   [] ?Palpitations   [] ?Shortness of breath when laying flat   [] ?Shortness of breath at rest   [] ?Shortness of breath with exertion. Vascular:  [] ?Pain in legs with walking   [] ?Pain in legs at rest   [] ?Pain in legs when laying flat   [] ?Claudication   [] ?Pain in feet when walking  [] ?Pain in feet at rest  [] ?Pain in feet when laying flat   [] ?History of DVT   [] ?Phlebitis   [] ?Swelling in legs   [x] ?Varicose veins   [] ?Non-healing ulcers Pulmonary:   [] ?Uses home oxygen   [] ?Productive cough   [] ?Hemoptysis   [] ?Wheeze  [] ?COPD   [x] ?Asthma Neurologic:  [x] ?Dizziness  [] ?Blackouts   [] ?Seizures   [] ?History of stroke   [] ?History of TIA  [] ?Aphasia   [] ?Temporary blindness   [] ?Dysphagia   [] ?Weakness or numbness in arms   [] ?Weakness or numbness in legs Musculoskeletal:  [x] ?Arthritis   [] ?Joint swelling   [] ?Joint pain   [] ?Low back pain Hematologic:  [] ?Easy bruising  [] ?Easy bleeding   [] ?Hypercoagulable state   [] ?Anemic  [] ?Hepatitis Gastrointestinal:  [] ?Blood in stool   [] ?Vomiting blood  [] ?Gastroesophageal reflux/heartburn   [] ?Abdominal pain Genitourinary:  [] ?Chronic kidney disease   [] ?Difficult urination  [] ?Frequent urination  [] ?Burning with urination   [] ?Hematuria Skin:  [] ?Rashes   [] ?Ulcers   [] ?Wounds Psychological:  [x] ?History of anxiety   [] ? History of major depression.   Physical Examination  BP (!) 187/95 (BP Location: Right Arm)    Pulse 86    Resp 16    Wt 133 lb 9.6 oz (60.6 kg)    BMI 23.67 kg/m  Gen:  WD/WN, NAD. Appears younger than stated age. Head: [place]/AT, No temporalis wasting. Ear/Nose/Throat: Hearing grossly intact, nares w/o erythema or drainage Eyes: Conjunctiva clear. Sclera non-icteric Neck: Supple.  Trachea midline Pulmonary:  Good air movement, no use of accessory muscles.  Cardiac: RRR, no JVD Vascular:  Vessel Right Left  Radial Palpable  Palpable               Gastrointestinal: soft, non-tender/non-distended. No guarding/reflex. Increased aortic impulse  Musculoskeletal: M/S 5/5 throughout.  No deformity or atrophy. No edema. Neurologic: Sensation grossly intact in extremities.  Symmetrical.  Speech is fluent.  Psychiatric: Judgment intact, Mood & affect appropriate for pt's clinical situation. Dermatologic: No rashes or ulcers noted.  No cellulitis  or open wounds.       Labs Recent Results (from the past 2160 hour(s))  Hemoglobin A1c     Status: None   Collection Time: 06/10/20 12:00 AM  Result Value Ref Range   Hemoglobin A1C 7.2   Lipid Panel With LDL/HDL Ratio     Status: Abnormal   Collection Time: 07/02/20 10:49 AM  Result Value Ref Range   Cholesterol, Total 195 100 - 199 mg/dL   Triglycerides 115 0 - 149 mg/dL   HDL 52 >39 mg/dL   VLDL Cholesterol Cal 21 5 - 40 mg/dL   LDL Chol Calc (NIH) 122 (H) 0 - 99 mg/dL   LDL/HDL Ratio 2.3 0.0 - 3.2 ratio    Comment:                                     LDL/HDL Ratio                                             Men  Women                               1/2 Avg.Risk  1.0    1.5                                   Avg.Risk  3.6    3.2                                2X Avg.Risk  6.2    5.0                                3X Avg.Risk  8.0    6.1   Creatinine     Status: None   Collection Time: 07/31/20  1:26 PM  Result Value Ref Range   Creatinine, Ser 0.67 0.44 - 1.00 mg/dL   GFR, Estimated >60 >60 mL/min    Comment: (NOTE) Calculated using the CKD-EPI Creatinine Equation (2021) Performed at [facility], [address] [place], [place] [address]     Radiology CT Angio Abd/Pel w/ and/or w/o  Result Date: 07/31/2020 CLINICAL DATA:  79 year old with an abdominal aortic aneurysm. EXAM: CTA ABDOMEN AND PELVIS WITHOUT AND WITH CONTRAST TECHNIQUE: Multidetector CT imaging of the abdomen and pelvis was performed using the standard protocol during  bolus administration of intravenous contrast. Multiplanar reconstructed images and MIPs were obtained and reviewed to evaluate the vascular anatomy. CONTRAST:  120mL OMNIPAQUE IOHEXOL 350 MG/ML SOLN COMPARISON:  Abdominal aorta ultrasound 07/09/2020 FINDINGS: VASCULAR Aorta: Distal descending thoracic aorta measures up to 2.4 cm without dissection. Normal caliber of the suprarenal abdominal aorta measuring roughly 2.3 cm. Diffuse atherosclerotic calcifications in the abdominal aorta. Infrarenal abdominal aorta is tortuous and swings towards the left side of the abdomen. Aneurysm involving the distal abdominal aorta measures up to 4.5 cm. The abdominal aortic aneurysm has fusiform and saccular characteristics. The aneurysm is eccentric towards the left and likely related to the tortuosity. No evidence for an aortic dissection. The  aneurysm terminates above the bifurcation. 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 No significant mural thrombus involving this aneurysm. Celiac: Patent without evidence of aneurysm, dissection, vasculitis or significant stenosis. Incidentally, left gastric artery likely originates directly from the aorta just above the celiac trunk. SMA: Patent. Small amount of noncalcified plaque along the posterior aspect of the proximal SMA without significant stenosis. No evidence for aneurysm, dissection or vasculitis. Renals: Single bilateral renal arteries. Both renal arteries are patent without evidence of aneurysm, dissection, vasculitis, fibromuscular dysplasia or significant stenosis. IMA: IMA is small but patent. Inflow: Right common iliac artery is calcified measuring up to 8 mm in the proximal aspect. Right external and right internal iliac arteries are patent with atherosclerotic calcifications. Mild narrowing in the proximal right external iliac artery. Left common iliac artery is patent but there is acute angulation at the origin with calcified plaque. Distal left common  iliac artery is patent without aneurysm or dissection. Left internal and external iliac arteries are patent bilaterally. Proximal Outflow: Proximal femoral arteries are patent bilaterally. Calcified plaque involving the common femoral arteries, left side greater than right. Positive remodeling in left common femoral artery but close to 50% stenosis due to the large calcified plaque. Veins: Main portal venous system is patent. Common iliac veins and IVC are patent. Bilateral renal veins are patent. Review of the MIP images confirms the above findings. NON-VASCULAR Lower chest: Lung bases are clear bilaterally. No significant pleural effusions. Hepatobiliary: No suspicious liver lesion. Normal appearance of the gallbladder. Common bile duct is prominent measuring 8 mm and likely normal for age. Pancreas: Mild dilatation of the main pancreatic duct without inflammatory changes. At least 2 foci of low-density in the pancreatic parenchyma compatible with areas of focal fat. No evidence for pancreatic inflammatory changes. Spleen: Normal in size without focal abnormality. Adrenals/Urinary Tract: Right adrenal gland is poorly visualized. Mild fullness in left adrenal gland is within normal limits. A tiny hypodensity in the right kidney lower pole could represent a cyst but too small to definitively characterize. No suspicious renal lesions. Negative for hydronephrosis. Stomach/Bowel: No focal bowel inflammation or bowel obstruction. Normal appearance of stomach. Lymphatic: No abdominal or pelvic lymphadenopathy. Reproductive: Status post hysterectomy. No adnexal masses. Other: Negative for ascites.  No significant abdominal wall hernias. Musculoskeletal: Joint space narrowing in both hips. IMPRESSION: VASCULAR 1. Abdominal aortic aneurysm measuring up to 4.5 cm. Aneurysm involves the distal abdominal aorta and has mixed fusiform and saccular characteristics. Aneurysm is eccentric towards the left side of the abdominal  aorta and likely associated with the tortuosity. Recommend follow-up CT/MR every 6 months and vascular consultation. This recommendation follows ACR consensus guidelines: White Paper of the ACR Incidental Findings Committee II on Vascular Findings. J Am Coll Radiol 2013; 10:789-794. 2. Bilateral iliac arteries have diffuse calcifications and there is acute angulation at the origin of the left common iliac artery. 3. Prominent calcified plaque involving the left common femoral artery. 4. Main visceral arteries are patent. NON-VASCULAR 1. No acute abnormality in the abdomen or pelvis. Electronically Signed   By: Markus Daft M.D.   On: 07/31/2020 15:24    Assessment/Plan Diabetes blood glucose control important in reducing the progression of atherosclerotic disease. Also, involved in wound healing. On appropriate medications.   BP (high blood pressure) blood pressure control important in reducing the progression of atherosclerotic disease and aneurysmal growth. On appropriate oral medications.  AAA (abdominal aortic aneurysm) without rupture ([place]) She has undergone a  CT angiogram of the abdomen pelvis which I have independently reviewed.  This is clearly an abdominal aortic aneurysm with no aneurysmal involvement of the iliac arteries although there is some atherosclerotic occlusive disease in the iliac arteries.  The aneurysm measures approximately 4.5 cm in maximal diameter and is located in the distal aorta.  There is no evidence of dissection or extravasation. We had a long discussion today regarding the natural history and pathophysiology of abdominal aortic aneurysms.  We discussed the reason and rationale for treatment to avoid lethal rupture.  This is not her iliac artery and so a 4.5 cm aneurysm does not have to be repaired immediately.  She is a smaller lady and if this continues to grow we may repair this slightly before 5 cm, but we discussed that 5 cm is the usual threshold for prophylactic  repair.  I will keep a close eye on this aneurysm we will plan on seeing her back in 3 months with a duplex in follow-up.    Leotis Pain, MD  08/09/2020 10:35 AM    This note was created with Dragon medical transcription system.  Any errors from dictation are purely unintentional

## 2020-08-09 NOTE — Patient Instructions (Signed)
Abdominal Aortic Aneurysm  An aneurysm is a bulge in an artery. It happens when blood pushes against a weakened or damaged artery wall. An abdominal aortic aneurysm (AAA) is an aneurysm that occurs in the lower part of the aorta, which is the main artery of the body. The aorta supplies blood from the heart to the rest of the body. Some aneurysms may not cause symptoms. However, an AAA can cause two serious problems:  It can enlarge and burst (rupture).  It can cause blood to flow between the layers of the wall of the aorta through a tear (aortic dissection). Both of these problems are medical emergencies. They can cause bleeding inside the body. If they are not diagnosed and treated right away, they can be life-threatening. What are the causes? The exact cause of this condition is not known. What increases the risk? The following factors may make you more likely to develop this condition:  Being a female 77 years of age or older.  Being Caucasian.  Using tobacco or having a history of tobacco use.  Having a family history of aneurysms.  Having any of the following conditions: ? Hardening of the arteries (arteriosclerosis). ? Inflammation of the walls of an artery (arteritis). ? Certain genetic conditions. ? Obesity. ? An infection in the wall of the aorta (infectious aortitis) caused by bacteria. ? High cholesterol. ? High blood pressure (hypertension). What are the signs or symptoms? Symptoms of this condition vary depending on the size of the aneurysm and how fast it is growing. Most aneurysms grow slowly and do not cause any symptoms. When symptoms do occur, they may include:  Pain in the abdomen, side, or lower back.  Feeling full after eating only small amounts of food.  Feeling a pulsating lump in the abdomen. Symptoms that the aneurysm has ruptured include:  A sudden onset of severe pain in the abdomen, side, or back.  Nausea or vomiting.  Feeling light-headed or  passing out. How is this diagnosed? This condition may be diagnosed with:  A physical exam to check for throbbing and to listen to blood flow in your abdomen.  Tests, such as: ? Ultrasound. ? X-rays. ? CT scan. ? MRI. ? Tests to check your arteries for damage or blockage (angiogram). Because most unruptured AAAs cause no symptoms, they are often found during exams for other conditions. How is this treated? Treatment for this condition depends on:  The size of the aneurysm.  How fast the aneurysm is growing.  Your age.  Risk factors for rupture. If your aneurysm is smaller than 2 inches (5 cm), your health care provider may manage it by:  Checking (monitoring) it regularly to see if it is getting bigger. Depending on the size of the aneurysm, how fast it is growing, and your other risk factors, you may have an ultrasound to monitor it every 3-6 months, every year, or every few years.  Giving you medicines to control blood pressure, treat pain, or fight infection. If your aneurysm is larger than 2 inches (5 cm), your health care provider may do surgery to repair it. Follow these instructions at home: Lifestyle  Do not use any products that contain nicotine or tobacco, such as cigarettes, e-cigarettes, and chewing tobacco. If you need help quitting, ask your health care provider.  Stay physically active and exercise regularly. Talk with your health care provider about how often you should exercise and which types of exercise are safe for you. Eating and drinking  Eat a heart-healthy diet. This includes plenty of fresh fruits and vegetables, whole grains, low-fat (lean) protein, and low-fat dairy products.  Avoid foods that are high in saturated fat and cholesterol, such as red meat and some dairy products.  Do not drink alcohol if: ? Your health care provider tells you not to drink. ? You are pregnant, may be pregnant, or are planning to become pregnant.  If you drink  alcohol: ? Limit how much you use to:  0-1 drink a day for women.  0-2 drinks a day for men. ? Be aware of how much alcohol is in your drink. In the [place] one drink equals one typical bottle of beer (12 oz), one-half glass of wine (5 oz), or one shot of hard liquor (1 oz). General instructions  Take over-the-counter and prescription medicines only as told by your health care provider.  Keep your blood pressure within a normal range. Check it regularly, and ask your health care provider what your target blood pressure should be.  Have your blood sugar (glucose) level and cholesterol levels checked regularly. Follow your health care provider's instructions on how to keep levels within normal limits.  Avoid heavy lifting and activities that take a lot of effort (are strenuous). Ask your health care provider what activities are safe for you.  Keep all follow-up visits as told by your health care provider. This is important. Contact a health care provider if you:  Have pain in your abdomen, side, or back.  Have a throbbing feeling in your abdomen. Get help right away if you:  Have sudden, severe pain in your abdomen, side, or back.  Experience nausea or vomiting.  Have constipation or problems urinating.  Feel light-headed.  Have a rapid heart rate when you stand.  Have sweaty, clammy skin.  Have shortness of breath.  Have a fever. These symptoms may represent a serious problem that is an emergency. Do not wait to see if the symptoms will go away. Get medical help right away. Call your local emergency services (911 in the [place] Do not drive yourself to the hospital. Summary  An aneurysm is a bulge in an artery. It happens when blood pushes against a weakened or damaged artery wall.  Being older, female, Caucasian, having a history of tobacco use, and a family history of aneurysms can increase the risk.  These problems can cause bleeding inside the body and can be  life-threatening. Get medical help right away. This information is not intended to replace advice given to you by your health care provider. Make sure you discuss any questions you have with your health care provider. Document Revised: 12/12/2018 Document Reviewed: 04/02/2018 Elsevier Patient Education  [place].

## 2020-08-09 NOTE — Assessment & Plan Note (Signed)
She has undergone a CT angiogram of the abdomen pelvis which I have independently reviewed.  This is clearly an abdominal aortic aneurysm with no aneurysmal involvement of the iliac arteries although there is some atherosclerotic occlusive disease in the iliac arteries.  The aneurysm measures approximately 4.5 cm in maximal diameter and is located in the distal aorta.  There is no evidence of dissection or extravasation. We had a long discussion today regarding the natural history and pathophysiology of abdominal aortic aneurysms.  We discussed the reason and rationale for treatment to avoid lethal rupture.  This is not her iliac artery and so a 4.5 cm aneurysm does not have to be repaired immediately.  She is a smaller lady and if this continues to grow we may repair this slightly before 5 cm, but we discussed that 5 cm is the usual threshold for prophylactic repair.  I will keep a close eye on this aneurysm we will plan on seeing her back in 3 months with a duplex in follow-up.

## 2020-08-12 ENCOUNTER — Ambulatory Visit: Payer: Self-pay

## 2020-08-12 NOTE — Telephone Encounter (Signed)
Pt. Had a CT scan 07/31/20 and 1-2 days after noticed "whelps to her neck and a headache." Has continued. Rash is itchy. Cortisone cream helps a little. Appointment made. Asking to be worked in sooner. Please advise.  Reason for Disposition . [1] Severe localized itching AND [2] after 2 days of steroid cream  Answer Assessment - Initial Assessment Questions 1. APPEARANCE of RASH: "Describe the rash."      [place] 2. LOCATION: "Where is the rash located?"      Neck 3. NUMBER: "How many spots are there?"      Unsure 4. SIZE: "How big are the spots?" (Inches, centimeters or compare to size of a coin)      Small 5. ONSET: "When did the rash start?"      08/02/20 6. ITCHING: "Does the rash itch?" If Yes, ask: "How bad is the itch?"  (Scale 1-10; or mild, moderate, severe)     Moderate 7. PAIN: "Does the rash hurt?" If Yes, ask: "How bad is the pain?"  (Scale 1-10; or mild, moderate, severe)     No 8. OTHER SYMPTOMS: "Do you have any other symptoms?" (e.g., fever)     Headache 9. PREGNANCY: "Is there any chance you are pregnant?" "When was your last menstrual period?"     No  Protocols used: RASH OR REDNESS - LOCALIZED-A-AH

## 2020-08-13 ENCOUNTER — Ambulatory Visit (INDEPENDENT_AMBULATORY_CARE_PROVIDER_SITE_OTHER): Payer: Medicare Other | Admitting: Family Medicine

## 2020-08-13 ENCOUNTER — Other Ambulatory Visit: Payer: Self-pay

## 2020-08-13 ENCOUNTER — Encounter: Payer: Self-pay | Admitting: Family Medicine

## 2020-08-13 VITALS — BP 124/70 | HR 68 | Ht 63.0 in | Wt 132.0 lb

## 2020-08-13 DIAGNOSIS — B029 Zoster without complications: Secondary | ICD-10-CM

## 2020-08-13 DIAGNOSIS — N393 Stress incontinence (female) (male): Secondary | ICD-10-CM | POA: Diagnosis not present

## 2020-08-13 DIAGNOSIS — N3281 Overactive bladder: Secondary | ICD-10-CM

## 2020-08-13 HISTORY — DX: Zoster without complications: B02.9

## 2020-08-13 LAB — POCT URINALYSIS DIPSTICK
Bilirubin, UA: NEGATIVE
Blood, UA: NEGATIVE
Glucose, UA: NEGATIVE
Ketones, UA: NEGATIVE
Leukocytes, UA: NEGATIVE
Nitrite, UA: NEGATIVE
Protein, UA: NEGATIVE
Spec Grav, UA: 1.01 (ref 1.010–1.025)
Urobilinogen, UA: 0.2 E.U./dL
pH, UA: 6 (ref 5.0–8.0)

## 2020-08-13 MED ORDER — OXYBUTYNIN CHLORIDE 5 MG PO TABS: 5.0000 mg | ORAL_TABLET | Freq: Two times a day (BID) | ORAL | 1 refills | Status: DC

## 2020-08-13 MED ORDER — VALACYCLOVIR HCL 1 G PO TABS: 1000.0000 mg | ORAL_TABLET | Freq: Two times a day (BID) | ORAL | 0 refills | Status: DC

## 2020-08-13 MED ORDER — TRIAMCINOLONE ACETONIDE 0.1 % EX CREA: 1.0000 "application " | TOPICAL_CREAM | Freq: Two times a day (BID) | CUTANEOUS | 0 refills | Status: DC

## 2020-08-13 NOTE — Patient Instructions (Signed)
Kegel Exercises  Kegel exercises can help strengthen your pelvic floor muscles. The pelvic floor is a group of muscles that support your rectum, small intestine, and bladder. In females, pelvic floor muscles also help support the womb (uterus). These muscles help you control the flow of urine and stool. Kegel exercises are painless and simple, and they do not require any equipment. Your provider may suggest Kegel exercises to:  Improve bladder and bowel control.  Improve sexual response.  Improve weak pelvic floor muscles after surgery to remove the uterus (hysterectomy) or pregnancy (females).  Improve weak pelvic floor muscles after prostate gland removal or surgery (males). Kegel exercises involve squeezing your pelvic floor muscles, which are the same muscles you squeeze when you try to stop the flow of urine or keep from passing gas. The exercises can be done while sitting, standing, or lying down, but it is best to vary your position. Exercises How to do Kegel exercises: 1. Squeeze your pelvic floor muscles tight. You should feel a tight lift in your rectal area. If you are a female, you should also feel a tightness in your vaginal area. Keep your stomach, buttocks, and legs relaxed. 2. Hold the muscles tight for up to 10 seconds. 3. Breathe normally. 4. Relax your muscles. 5. Repeat as told by your health care provider. Repeat this exercise daily as told by your health care provider. Continue to do this exercise for at least 4-6 weeks, or for as long as told by your health care provider. You may be referred to a physical therapist who can help you learn more about how to do Kegel exercises. Depending on your condition, your health care provider may recommend:  Varying how long you squeeze your muscles.  Doing several sets of exercises every day.  Doing exercises for several weeks.  Making Kegel exercises a part of your regular exercise routine. This information is not intended  to replace advice given to you by your health care provider. Make sure you discuss any questions you have with your health care provider. Document Revised: 04/13/2018 Document Reviewed: 04/13/2018 Elsevier Patient Education  [place]. Urinary Incontinence  Urinary incontinence refers to a condition in which a person is unable to control where and when to pass urine. A person with this condition will urinate when he or she does not mean to (involuntarily). What are the causes? This condition may be caused by:  Medicines.  Infections.  Constipation.  Overactive bladder muscles.  Weak bladder muscles.  Weak pelvic floor muscles. These muscles provide support for the bladder, intestine, and, in women, the uterus.  Enlarged prostate in men. The prostate is a gland near the bladder. When it gets too big, it can pinch the urethra. With the urethra blocked, the bladder can weaken and lose the ability to empty properly.  Surgery.  Emotional factors, such as anxiety, stress, or post-traumatic stress disorder (PTSD).  Pelvic organ prolapse. This happens in women when organs shift out of place and into the vagina. This shift can prevent the bladder and urethra from working properly. What increases the risk? The following factors may make you more likely to develop this condition:  Older age.  Obesity and physical inactivity.  Pregnancy and childbirth.  Menopause.  Diseases that affect the nerves or spinal cord (neurological diseases).  Long-term (chronic) coughing. This can increase pressure on the bladder and pelvic floor muscles. What are the signs or symptoms? Symptoms may vary depending on the type of urinary incontinence  you have. They include:  A sudden urge to urinate, but passing urine involuntarily before you can get to a bathroom (urge incontinence).  Suddenly passing urine with any activity that forces urine to pass, such as coughing, laughing, exercise, or  sneezing (stress incontinence).  Needing to urinate often, but urinating only a small amount, or constantly dribbling urine (overflow incontinence).  Urinating because you cannot get to the bathroom in time due to a physical disability, such as arthritis or injury, or communication and thinking problems, such as Alzheimer disease (functional incontinence). How is this diagnosed? This condition may be diagnosed based on:  Your medical history.  A physical exam.  Tests, such as: ? Urine tests. ? X-rays of your kidney and bladder. ? Ultrasound. ? CT scan. ? Cystoscopy. In this procedure, a health care provider inserts a tube with a light and camera (cystoscope) through the urethra and into the bladder in order to check for problems. ? Urodynamic testing. These tests assess how well the bladder, urethra, and sphincter can store and release urine. There are different types of urodynamic tests, and they vary depending on what the test is measuring. To help diagnose your condition, your health care provider may recommend that you keep a log of when you urinate and how much you urinate. How is this treated? Treatment for this condition depends on the type of incontinence that you have and its cause. Treatment may include:  Lifestyle changes, such as: ? Quitting smoking. ? Maintaining a healthy weight. ? Staying active. Try to get 150 minutes of moderate-intensity exercise every week. Ask your health care provider which activities are safe for you. ? Eating a healthy diet.  Avoid high-fat foods, like fried foods.  Avoid refined carbohydrates like white bread and white rice.  Limit how much alcohol and caffeine you drink.  Increase your fiber intake. Foods such as fresh fruits, vegetables, beans, and whole grains are healthy sources of fiber.  Pelvic floor muscle exercises.  Bladder training, such as lengthening the amount of time between bathroom breaks, or using the bathroom at regular  intervals.  Using techniques to suppress bladder urges. This can include distraction techniques or controlled breathing exercises.  Medicines to relax the bladder muscles and prevent bladder spasms.  Medicines to help slow or prevent the growth of a man's prostate.  Botox injections. These can help relax the bladder muscles.  Using pulses of electricity to help change bladder reflexes (electrical nerve stimulation).  For women, using a medical device to prevent urine leaks. This is a small, tampon-like, disposable device that is inserted into the urethra.  Injecting collagen or carbon beads (bulking agents) into the urinary sphincter. These can help thicken tissue and close the bladder opening.  Surgery. Follow these instructions at home: Lifestyle  Limit alcohol and caffeine. These can fill your bladder quickly and irritate it.  Keep yourself clean to help prevent odors and skin damage. Ask your doctor about special skin creams and cleansers that can protect the skin from urine.  Consider wearing pads or adult diapers. Make sure to change them regularly, and always change them right after experiencing incontinence. General instructions  Take over-the-counter and prescription medicines only as told by your health care provider.  Use the bathroom about every 3-4 hours, even if you do not feel the need to urinate. Try to empty your bladder completely every time. After urinating, wait a minute. Then try to urinate again.  Make sure you are in a relaxed position  while urinating.  If your incontinence is caused by nerve problems, keep a log of the medicines you take and the times you go to the bathroom.  Keep all follow-up visits as told by your health care provider. This is important. Contact a health care provider if:  You have pain that gets worse.  Your incontinence gets worse. Get help right away if:  You have a fever or chills.  You are unable to urinate.  You have  redness in your groin area or down your legs. Summary  Urinary incontinence refers to a condition in which a person is unable to control where and when to pass urine.  This condition may be caused by medicines, infection, weak bladder muscles, weak pelvic floor muscles, enlargement of the prostate (in men), or surgery.  The following factors increase your risk for developing this condition: older age, obesity, pregnancy and childbirth, menopause, neurological diseases, and chronic coughing.  There are several types of urinary incontinence. They include urge incontinence, stress incontinence, overflow incontinence, and functional incontinence.  This condition is usually treated first with lifestyle and behavioral changes, such as quitting smoking, eating a healthier diet, and doing regular pelvic floor exercises. Other treatment options include medicines, bulking agents, medical devices, electrical nerve stimulation, or surgery. This information is not intended to replace advice given to you by your health care provider. Make sure you discuss any questions you have with your health care provider. Document Revised: 09/03/2017 Document Reviewed: 12/03/2016 Elsevier Patient Education  [place].

## 2020-08-13 NOTE — Progress Notes (Signed)
Date:  08/13/2020   Name:  Jill Guzman   DOB:  04/03/1941   MRN:  [id number]   Chief Complaint: Rash (had CT on 11/24- rash came up 2 days later. red, raised, burning, itching- using cortisone cream. ) and Urinary Incontinence ("dribbles" no matter what she is doing)  Rash This is a new problem. The current episode started 1 to 4 weeks ago. The problem has been waxing and waning since onset. The affected locations include the neck. The rash is characterized by redness. Associated with: contrast ct. Pertinent negatives include no anorexia, congestion, cough, diarrhea, eye pain, facial edema, fatigue, fever, joint pain, nail changes, rhinorrhea, shortness of breath, sore throat or vomiting. (Left posterior occipital) Past treatments include analgesics. The treatment provided moderate relief. Her past medical history is significant for varicella. There is no history of allergies, asthma or eczema.  Female GU Problem The patient's pertinent negatives include no genital itching, genital lesions, genital rash, missed menses, pelvic pain, vaginal bleeding or vaginal discharge. This is a chronic problem. The current episode started more than 1 year ago. The patient is experiencing no pain. Associated symptoms include frequency and rash. Pertinent negatives include no abdominal pain, anorexia, back pain, chills, constipation, diarrhea, discolored urine, dysuria, fever, flank pain, headaches, hematuria, joint pain, joint swelling, nausea, painful intercourse, sore throat, urgency or vomiting. The treatment provided moderate relief.    Lab Results  Component Value Date   CREATININE 0.67 07/31/2020   BUN 10 11/15/2019   NA 141 11/15/2019   K 4.5 11/15/2019   CL 103 11/15/2019   CO2 23 11/15/2019   Lab Results  Component Value Date   CHOL 195 07/02/2020   HDL 52 07/02/2020   LDLCALC 122 (H) 07/02/2020   TRIG 115 07/02/2020   CHOLHDL 2.9 03/29/2019   Lab Results  Component Value Date   TSH  1.650 03/29/2019   Lab Results  Component Value Date   HGBA1C 7.2 06/10/2020   Lab Results  Component Value Date   WBC 8.3 03/29/2019   HGB 14.1 03/29/2019   HCT 42.9 03/29/2019   MCV 85 03/29/2019   PLT 225 03/29/2019   Lab Results  Component Value Date   ALT 12 11/15/2019   AST 14 11/15/2019   ALKPHOS 53 11/15/2019   BILITOT 0.5 11/15/2019     Review of Systems  Constitutional: Negative.  Negative for chills, fatigue, fever and unexpected weight change.  HENT: Negative for congestion, ear discharge, ear pain, rhinorrhea, sinus pressure, sneezing and sore throat.   Eyes: Negative for photophobia, pain, discharge, redness and itching.  Respiratory: Negative for cough, shortness of breath, wheezing and stridor.   Cardiovascular: Negative for leg swelling.  Gastrointestinal: Negative for abdominal pain, anorexia, blood in stool, constipation, diarrhea, nausea and vomiting.  Endocrine: Negative for cold intolerance, heat intolerance, polydipsia, polyphagia and polyuria.  Genitourinary: Positive for frequency. Negative for dysuria, flank pain, hematuria, menstrual problem, missed menses, pelvic pain, urgency, vaginal bleeding and vaginal discharge.  Musculoskeletal: Negative for arthralgias, back pain, joint pain and myalgias.  Skin: Positive for rash. Negative for nail changes.  Allergic/Immunologic: Negative for environmental allergies and food allergies.  Neurological: Negative for dizziness, weakness, light-headedness, numbness and headaches.  Hematological: Negative for adenopathy. Does not bruise/bleed easily.  Psychiatric/Behavioral: Negative for dysphoric mood. The patient is not nervous/anxious.     Patient Active Problem List   Diagnosis Date Noted  . AAA (abdominal aortic aneurysm) without rupture ([place]) 08/09/2020  . 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 Nonexudative age-related macular degeneration, bilateral, early dry stage 03/27/2019  . Recurrent major  depressive disorder, in partial remission ([place]) 02/14/2018  . Chronic anxiety 02/09/2017  . AB (asthmatic bronchitis), mild intermittent, uncomplicated 60/06/9322  . Chronic obstructive pulmonary disease ([place]) 07/14/2016  . 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 Routine general medical examination at a health care facility 12/31/2014  . Diabetes ([place]) 12/31/2014  . BP (high blood pressure) 12/31/2014  . Osteopenia 12/31/2014    Allergies  Allergen Reactions  . Penicillins Itching  . Sulfa Antibiotics Itching    Past Surgical History:  Procedure Laterality Date  . ABDOMINAL HYSTERECTOMY    . BREAST EXCISIONAL BIOPSY Left 1976   neg  . CATARACT EXTRACTION W/ INTRAOCULAR LENS  IMPLANT, BILATERAL    . COLONOSCOPY  2014   normal  . cyst on bladder removed    . cyst removed breast Left   . KNEE ARTHROSCOPY WITH MEDIAL MENISECTOMY Left 10/20/2019   Procedure: KNEE ARTHROSCOPY WITH PARTIAL MEDIAL MENISECTOMY;  Surgeon: Leim Fabry, MD;  Location: [facility];  Service: Orthopedics;  Laterality: Left;  DIABETIC - oral meds  . PARTIAL HYSTERECTOMY    . TUBAL LIGATION      Social History   Tobacco Use  . Smoking status: Former Smoker    Packs/day: 2.00    Years: 10.00    Pack years: 20.00    Types: Cigarettes    Quit date: 05/11/1999    Years since quitting: 21.2  . Smokeless tobacco: Never Used  . Tobacco comment: Smoking cessation materials not required  Vaping Use  . Vaping Use: Never used  Substance Use Topics  . Alcohol use: No    Alcohol/week: 0.0 standard drinks  . Drug use: No     Medication list has been reviewed and updated.  Current Meds  Medication Sig  . acetaminophen (TYLENOL) 500 MG tablet Take 2 tablets (1,000 mg total) by mouth every 8 (eight) hours.  Marland Kitchen albuterol (VENTOLIN HFA) 108 (90 Base) MCG/ACT inhaler Inhale 1 puff into the lungs QID.  Marland Kitchen  amLODipine (NORVASC) 2.5 MG tablet Take 1 tablet (2.5 mg total) by mouth daily.  Marland Kitchen atorvastatin (LIPITOR) 10 MG tablet Take 1 tablet (10 mg total) by mouth daily.  . Cholecalciferol (VITAMIN D) 50 MCG (2000 UT) CAPS Take by mouth.  Marland Kitchen glucose blood (ACCU-CHEK AVIVA PLUS) test strip USE ONE STRIP TO CHECK GLUCOSE ONCE DAILY  . losartan (COZAAR) 100 MG tablet Take 1 tablet (100 mg total) by mouth daily.  . metFORMIN (GLUCOPHAGE) 500 MG tablet Take 1 tablet (500 mg total) by mouth 2 (two) times daily. (Patient taking differently: Take 500 mg by mouth 2 (two) times daily with a meal. I tab bid)  . Omega 3 1000 MG CAPS Take 1 capsule (1,000 mg total) by mouth daily.  Marland Kitchen spironolactone (ALDACTONE) 25 MG tablet Take 25 mg by mouth daily.    PHQ 2/9 Scores 08/13/2020 07/02/2020 11/15/2019 10/16/2019  PHQ - 2 Score 0 0 0 2  PHQ- 9 Score 0 0 0 6    GAD 7 : Generalized Anxiety Score 08/13/2020 07/02/2020 11/15/2019 09/04/2019  Nervous, Anxious, on Edge 0 1 0 1  Control/stop worrying 0 0 0 1  Worry too much - different things 0 0 0 2  Trouble relaxing 0 0 0 0  Restless 0 0 0 0  Easily annoyed or irritable  0 0 0 0  Afraid - awful might happen 0 0 0 1  Total GAD 7 Score 0 1 0 5  Anxiety Difficulty - Not difficult at all - Somewhat difficult    BP Readings from Last 3 Encounters:  08/13/20 124/70  08/09/20 (!) 187/95  07/23/20 (!) 166/96    Physical Exam  Wt Readings from Last 3 Encounters:  08/13/20 132 lb (59.9 kg)  08/09/20 133 lb 9.6 oz (60.6 kg)  07/23/20 134 lb (60.8 kg)    BP 124/70   Pulse 68   Ht 5\' 3"  (1.6 m)   Wt 132 lb (59.9 kg)   BMI 23.38 kg/m   Assessment and Plan:

## 2020-08-14 ENCOUNTER — Telehealth: Payer: Self-pay

## 2020-08-14 ENCOUNTER — Ambulatory Visit: Payer: Self-pay | Admitting: Family Medicine

## 2020-08-14 NOTE — Telephone Encounter (Unsigned)
Copied from [place] [id number]. Topic: Quick Communication - See Telephone Encounter >> Aug 14, 2020  9:22 AM Loma [place] wrote: CRM for notification. See Telephone encounter for: 08/14/20.triamcinolone (KENALOG) 0.1 % Medication Date: 08/13/2020 Department: [facility] Ordering/Authorizing: Juline Patch, MD  Pt call very upset as states that the pharmacy will not fill as pharmacy can not make determination as cream or lotion... pt said she worked on a response all day yesterday could not find link for concerns yesterday. Pt has not received med

## 2020-08-14 NOTE — Telephone Encounter (Signed)
Called [facility] and told them cream/ called pt as well

## 2020-10-16 ENCOUNTER — Ambulatory Visit (INDEPENDENT_AMBULATORY_CARE_PROVIDER_SITE_OTHER): Payer: Medicare Other

## 2020-10-16 DIAGNOSIS — Z0000 Encounter for general adult medical examination without abnormal findings: Secondary | ICD-10-CM
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
## 2020-10-16 NOTE — Patient Instructions (Signed)
Jill Guzman , Thank you for taking time to come for your Medicare Wellness Visit. I appreciate your ongoing commitment to your health goals. Please review the following plan we discussed and let me know if I can assist you in the future.   Screening recommendations/referrals: Colonoscopy: no longer required Mammogram: done 02/23/19 Bone Density: done 11/20/19 Recommended yearly ophthalmology/optometry visit for glaucoma screening and checkup Recommended yearly dental visit for hygiene and checkup  Vaccinations: Influenza vaccine: done 05/24/20 Pneumococcal vaccine: done 06/22/19 Tdap vaccine: done 09/20/18 Shingles vaccine: done 06/22/19. Due for second dose 02/2021   Covid-19:done 09/13/19, 10/04/19 & 06/14/20  Advanced directives: Advance directive discussed with you today. I have provided a copy for you to complete at home and have notarized. Once this is complete please bring a copy in to our office so we can scan it into your chart.  Conditions/risks identified: Recommend drinking 6-8 glasses of water per day   Next appointment: Follow up in one year for your annual wellness visit    Preventive Care 65 Years and Older, Female Preventive care refers to lifestyle choices and visits with your health care provider that can promote health and wellness. What does preventive care include?  A yearly physical exam. This is also called an annual well check.  Dental exams once or twice a year.  Routine eye exams. Ask your health care provider how often you should have your eyes checked.  Personal lifestyle choices, including:  Daily care of your teeth and gums.  Regular physical activity.  Eating a healthy diet.  Avoiding tobacco and drug use.  Limiting alcohol use.  Practicing safe sex.  Taking low-dose aspirin every day.  Taking vitamin and mineral supplements as recommended by your health care provider. What happens during an annual well check? The services and screenings  done by your health care provider during your annual well check will depend on your age, overall health, lifestyle risk factors, and family history of disease. Counseling  Your health care provider may ask you questions about your:  Alcohol use.  Tobacco use.  Drug use.  Emotional well-being.  Home and relationship well-being.  Sexual activity.  Eating habits.  History of falls.  Memory and ability to understand (cognition).  Work and work Statistician.  Reproductive health. Screening  You may have the following tests or measurements:  Height, weight, and BMI.  Blood pressure.  Lipid and cholesterol levels. These may be checked every 5 years, or more frequently if you are over 76 years old.  Skin check.  Lung cancer screening. You may have this screening every year starting at age 11 if you have a 30-pack-year history of smoking and currently smoke or have quit within the past 15 years.  Fecal occult blood test (FOBT) of the stool. You may have this test every year starting at age 65.  Flexible sigmoidoscopy or colonoscopy. You may have a sigmoidoscopy every 5 years or a colonoscopy every 10 years starting at age 59.  Hepatitis C blood test.  Hepatitis B blood test.  Sexually transmitted disease (STD) testing.  Diabetes screening. This is done by checking your blood sugar (glucose) after you have not eaten for a while (fasting). You may have this done every 1-3 years.  Bone density scan. This is done to screen for osteoporosis. You may have this done starting at age 32.  Mammogram. This may be done every 1-2 years. Talk to your health care provider about how often you should have regular mammograms.  Talk with your health care provider about your test results, treatment options, and if necessary, the need for more tests. Vaccines  Your health care provider may recommend certain vaccines, such as:  Influenza vaccine. This is recommended every year.  Tetanus,  diphtheria, and acellular pertussis (Tdap, Td) vaccine. You may need a Td booster every 10 years.  Zoster vaccine. You may need this after age 23.  Pneumococcal 13-valent conjugate (PCV13) vaccine. One dose is recommended after age 67.  Pneumococcal polysaccharide (PPSV23) vaccine. One dose is recommended after age 28. Talk to your health care provider about which screenings and vaccines you need and how often you need them. This information is not intended to replace advice given to you by your health care provider. Make sure you discuss any questions you have with your health care provider. Document Released: 09/20/2015 Document Revised: 05/13/2016 Document Reviewed: 06/25/2015 Elsevier Interactive Patient Education  2017 [place] Prevention in the Home Falls can cause injuries. They can happen to people of all ages. There are many things you can do to make your home safe and to help prevent falls. What can I do on the outside of my home?  Regularly fix the edges of walkways and driveways and fix any cracks.  Remove anything that might make you trip as you walk through a door, such as a raised step or threshold.  Trim any bushes or trees on the path to your home.  Use bright outdoor lighting.  Clear any walking paths of anything that might make someone trip, such as rocks or tools.  Regularly check to see if handrails are loose or broken. Make sure that both sides of any steps have handrails.  Any raised decks and porches should have guardrails on the edges.  Have any leaves, snow, or ice cleared regularly.  Use sand or salt on walking paths during winter.  Clean up any spills in your garage right away. This includes oil or grease spills. What can I do in the bathroom?  Use night lights.  Install grab bars by the toilet and in the tub and shower. Do not use towel bars as grab bars.  Use non-skid mats or decals in the tub or shower.  If you need to sit down in  the shower, use a plastic, non-slip stool.  Keep the floor dry. Clean up any water that spills on the floor as soon as it happens.  Remove soap buildup in the tub or shower regularly.  Attach bath mats securely with double-sided non-slip rug tape.  Do not have throw rugs and other things on the floor that can make you trip. What can I do in the bedroom?  Use night lights.  Make sure that you have a light by your bed that is easy to reach.  Do not use any sheets or blankets that are too big for your bed. They should not hang down onto the floor.  Have a firm chair that has side arms. You can use this for support while you get dressed.  Do not have throw rugs and other things on the floor that can make you trip. What can I do in the kitchen?  Clean up any spills right away.  Avoid walking on wet floors.  Keep items that you use a lot in easy-to-reach places.  If you need to reach something above you, use a strong step stool that has a grab bar.  Keep electrical cords out of the way.  Do not use floor polish or wax that makes floors slippery. If you must use wax, use non-skid floor wax.  Do not have throw rugs and other things on the floor that can make you trip. What can I do with my stairs?  Do not leave any items on the stairs.  Make sure that there are handrails on both sides of the stairs and use them. Fix handrails that are broken or loose. Make sure that handrails are as long as the stairways.  Check any carpeting to make sure that it is firmly attached to the stairs. Fix any carpet that is loose or worn.  Avoid having throw rugs at the top or bottom of the stairs. If you do have throw rugs, attach them to the floor with carpet tape.  Make sure that you have a light switch at the top of the stairs and the bottom of the stairs. If you do not have them, ask someone to add them for you. What else can I do to help prevent falls?  Wear shoes that:  Do not have high  heels.  Have rubber bottoms.  Are comfortable and fit you well.  Are closed at the toe. Do not wear sandals.  If you use a stepladder:  Make sure that it is fully opened. Do not climb a closed stepladder.  Make sure that both sides of the stepladder are locked into place.  Ask someone to hold it for you, if possible.  Clearly mark and make sure that you can see:  Any grab bars or handrails.  First and last steps.  Where the edge of each step is.  Use tools that help you move around (mobility aids) if they are needed. These include:  Canes.  Walkers.  Scooters.  Crutches.  Turn on the lights when you go into a dark area. Replace any light bulbs as soon as they burn out.  Set up your furniture so you have a clear path. Avoid moving your furniture around.  If any of your floors are uneven, fix them.  If there are any pets around you, be aware of where they are.  Review your medicines with your doctor. Some medicines can make you feel dizzy. This can increase your chance of falling. Ask your doctor what other things that you can do to help prevent falls. This information is not intended to replace advice given to you by your health care provider. Make sure you discuss any questions you have with your health care provider. Document Released: 06/20/2009 Document Revised: 01/30/2016 Document Reviewed: 09/28/2014 Elsevier Interactive Patient Education  2017 [employer].

## 2020-10-21 ENCOUNTER — Ambulatory Visit: Payer: Self-pay | Admitting: *Deleted

## 2020-10-21 ENCOUNTER — Other Ambulatory Visit: Payer: Self-pay

## 2020-10-21 DIAGNOSIS — J301 Allergic rhinitis due to pollen: Secondary | ICD-10-CM

## 2020-10-21 MED ORDER — CETIRIZINE HCL 10 MG PO TABS: 10.0000 mg | ORAL_TABLET | Freq: Every day | ORAL | 2 refills | Status: DC

## 2020-10-21 NOTE — Progress Notes (Unsigned)
Sent in zyrtec to [facility]

## 2020-10-21 NOTE — Telephone Encounter (Signed)
Hey I sent in Zyrtec for her, but it is not a guarantee ins will pay for it because it is available otc.

## 2020-10-21 NOTE — Telephone Encounter (Addendum)
Pt called wanting to know if she can take generic zyrtec for her runny nose; the pt says she has seasonally allergies and usus ally does not need medication; she would like to know if she can take generic zyrtec; she would like to remind Dr [name] that she has an aneurysm; she can be contacted at [phone]; will route to office for provider review and disposition.

## 2020-10-21 NOTE — Telephone Encounter (Signed)
  Reason for Disposition . Caller wants to use a complementary or alternative medicine  Answer Assessment - Initial Assessment Questions 1. NAME of MEDICATION: "What medicine are you calling about?"     Generic zyrtec 2. QUESTION: "What is your question?" (e.g., medication refill, side effect)    Can she take this medication? She has an aneursym 3. PRESCRIBING HCP: "Who prescribed it?" Reason: if prescribed by specialist, call should be referred to that group.    N/a OTC med 4. SYMPTOMS: "Do you have any symptoms?"     "runny nose" 5. SEVERITY: If symptoms are present, ask "Are they mild, moderate or severe?"      6. PREGNANCY:  "Is there any chance that you are pregnant?" "When was your last menstrual period?"     no  Protocols used: MEDICATION QUESTION CALL-A-AH

## 2020-11-05 ENCOUNTER — Ambulatory Visit (INDEPENDENT_AMBULATORY_CARE_PROVIDER_SITE_OTHER): Payer: Medicare Other | Admitting: Vascular Surgery

## 2020-11-05 ENCOUNTER — Other Ambulatory Visit: Payer: Self-pay

## 2020-11-05 ENCOUNTER — Ambulatory Visit (INDEPENDENT_AMBULATORY_CARE_PROVIDER_SITE_OTHER): Payer: Medicare Other

## 2020-11-05 VITALS — BP 131/82 | HR 89 | Ht 62.0 in | Wt 132.0 lb

## 2020-11-05 DIAGNOSIS — I10 Essential (primary) hypertension: Secondary | ICD-10-CM

## 2020-11-05 DIAGNOSIS — I714 Abdominal aortic aneurysm, without rupture, unspecified: Secondary | ICD-10-CM

## 2020-11-05 DIAGNOSIS — E119 Type 2 diabetes mellitus without complications: Secondary | ICD-10-CM

## 2020-11-05 NOTE — Assessment & Plan Note (Signed)
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 Plan follow-up in 6 months with duplex. Contact our office with problems in the interim.

## 2020-11-05 NOTE — Progress Notes (Signed)
MRN : [id number]  Jill Guzman is a 80 y.o. (May 07, 1941) female who presents with chief complaint of  Chief Complaint  Patient presents with  . Follow-up    3 mo U/S   .  History of Present Illness: Patient returns today in follow up of her abdominal aortic aneurysm. She is doing well today without any aneurysm related symptoms. Specifically, the patient denies new persistent back or abdominal pain, or signs of peripheral embolization. 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   Current Outpatient Medications  Medication Sig Dispense Refill  . albuterol (VENTOLIN HFA) 108 (90 Base) MCG/ACT inhaler Inhale 1 puff into the lungs QID. 6.7 g 11  . amLODipine (NORVASC) 2.5 MG tablet Take 1 tablet (2.5 mg total) by mouth daily. 90 tablet 1  . atorvastatin (LIPITOR) 10 MG tablet Take 1 tablet (10 mg total) by mouth daily. 90 tablet 1  . cetirizine (ZYRTEC) 10 MG tablet Take 1 tablet (10 mg total) by mouth daily. 30 tablet 2  . Cholecalciferol (VITAMIN D) 50 MCG (2000 UT) CAPS Take by mouth.    Marland Kitchen glucose blood (ACCU-CHEK AVIVA PLUS) test strip USE ONE STRIP TO CHECK GLUCOSE ONCE DAILY 50 each 11  . losartan (COZAAR) 100 MG tablet Take 1 tablet (100 mg total) by mouth daily. 90 tablet 1  . metFORMIN (GLUCOPHAGE) 500 MG tablet Take 1 tablet (500 mg total) by mouth 2 (two) times daily. (Patient taking differently: Take 500 mg by mouth 2 (two) times daily with a meal. I tab bid) 180 tablet 6  . Omega 3 1000 MG CAPS Take 1 capsule (1,000 mg total) by mouth daily.    Marland Kitchen spironolactone (ALDACTONE) 25 MG tablet Take 25 mg by mouth daily.     No current facility-administered medications for this visit.    Past Medical History:  Diagnosis Date  . Anxiety   . Aortic aneurysm ([place])   . Asthma   . Bell's palsy    age 40 and age 73   . COPD (chronic obstructive pulmonary disease) ([place])   . Deaf, right   . Depression    . Diabetes mellitus without complication ([place])   . Hearing aid worn    left  . Hyperlipidemia   . Hypertension   . Shingles 08/13/2020  . Varicose veins of legs   . Vertigo    random, approx 1x/month  . Wears dentures    full upper and lower    Past Surgical History:  Procedure Laterality Date  . ABDOMINAL HYSTERECTOMY    . BREAST EXCISIONAL BIOPSY Left 1976   neg  . CATARACT EXTRACTION W/ INTRAOCULAR LENS  IMPLANT, BILATERAL    . COLONOSCOPY  2014   normal  . cyst on bladder removed    . cyst removed breast Left   . KNEE ARTHROSCOPY WITH MEDIAL MENISECTOMY Left 10/20/2019   Procedure: KNEE ARTHROSCOPY WITH PARTIAL MEDIAL MENISECTOMY;  Surgeon: Leim Fabry, MD;  Location: [facility];  Service: Orthopedics;  Laterality: Left;  DIABETIC - oral meds  . PARTIAL HYSTERECTOMY    . TUBAL LIGATION       Social History   Tobacco Use  . Smoking status: Former Smoker    Packs/day: 2.00    Years: 10.00    Pack years: 20.00    Types: Cigarettes    Quit date: 05/11/1999    Years since quitting: 21.5  .  Smokeless tobacco: Never Used  . Tobacco comment: Smoking cessation materials not required  Vaping Use  . Vaping Use: Never used  Substance Use Topics  . Alcohol use: No    Alcohol/week: 0.0 standard drinks  . Drug use: No       Family History  Problem Relation Age of Onset  . Diabetes Mother   . Stroke Mother   . Healthy Daughter   . Cancer Son 50       lung  . Hypertension Son   . Rheum arthritis Daughter   . Hypertension Daughter   . Arthritis Daughter   . COPD Son   . Breast cancer Neg Hx      Allergies  Allergen Reactions  . Penicillins Itching  . Sulfa Antibiotics Itching    REVIEW OF SYSTEMS(Negative unless checked)  Constitutional: [] ??Weight loss [] ??Fever [] ??Chills Cardiac: [] ??Chest pain [] ??Chest pressure [] ??Palpitations [] ??Shortness of breath when laying flat [] ??Shortness of breath at rest [] ??Shortness of breath  with exertion. Vascular: [] ??Pain in legs with walking [] ??Pain in legs at rest [] ??Pain in legs when laying flat [] ??Claudication [] ??Pain in feet when walking [] ??Pain in feet at rest [] ??Pain in feet when laying flat [] ??History of DVT [] ??Phlebitis [] ??Swelling in legs [x] ??Varicose veins [] ??Non-healing ulcers Pulmonary: [] ??Uses home oxygen [] ??Productive cough [] ??Hemoptysis [] ??Wheeze [] ??COPD [x] ??Asthma Neurologic: [x] ??Dizziness [] ??Blackouts [] ??Seizures [] ??History of stroke [] ??History of TIA [] ??Aphasia [] ??Temporary blindness [] ??Dysphagia [] ??Weakness or numbness in arms [] ??Weakness or numbness in legs Musculoskeletal: [x] ??Arthritis [] ??Joint swelling [] ??Joint pain [] ??Low back pain Hematologic: [] ??Easy bruising [] ??Easy bleeding [] ??Hypercoagulable state [] ??Anemic [] ??Hepatitis Gastrointestinal: [] ??Blood in stool [] ??Vomiting blood [] ??Gastroesophageal reflux/heartburn [] ??Abdominal pain Genitourinary: [] ??Chronic kidney disease [] ??Difficult urination [] ??Frequent urination [] ??Burning with urination [] ??Hematuria Skin: [] ??Rashes [] ??Ulcers [] ??Wounds Psychological: [x] ??History of anxiety [] ??History of major depression.  Physical Examination  BP 131/82   Pulse 89   Ht 5\' 2"  (1.575 m)   Wt 132 lb (59.9 kg)   BMI 24.14 kg/m  Gen:  WD/WN, NAD Head: [place]/AT, No temporalis wasting. Ear/Nose/Throat: Hearing grossly intact, nares w/o erythema or drainage Eyes: Conjunctiva clear. Sclera non-icteric Neck: Supple.  Trachea midline Pulmonary:  Good air movement, no use of accessory muscles.  Cardiac: RRR, no JVD Vascular:  Vessel Right Left  Radial Palpable Palpable                   Gastrointestinal: soft, non-tender/non-distended. No guarding/reflex. Increased aortic impulse Musculoskeletal: M/S 5/5 throughout.  No deformity or atrophy. No edema. Neurologic: Sensation grossly  intact in extremities.  Symmetrical.  Speech is fluent.  Psychiatric: Judgment intact, Mood & affect appropriate for pt's clinical situation. Dermatologic: No rashes or ulcers noted.  No cellulitis or open wounds.       Labs Recent Results (from the past 2160 hour(s))  POCT urinalysis dipstick     Status: None   Collection Time: 08/13/20  9:39 AM  Result Value Ref Range   Color, UA yellow    Clarity, UA clear    Glucose, UA Negative Negative   Bilirubin, UA negative    Ketones, UA negative    Spec Grav, UA 1.010 1.010 - 1.025   Blood, UA negative    pH, UA 6.0 5.0 - 8.0   Protein, UA Negative Negative   Urobilinogen, UA 0.2 0.2 or 1.0 E.U./dL   Nitrite, UA negative    Leukocytes, UA Negative Negative   Appearance yellow    Odor none     Radiology No results found.  Assessment/Plan Diabetes blood glucose control important in  reducing the progression of atherosclerotic disease. Also, involved in wound healing. On appropriate medications.   BP (high blood pressure) blood pressure control important in reducing the progression of atherosclerotic diseaseand aneurysmal growth. On appropriate oral medications.  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 Plan follow-up in 6 months with duplex. Contact our office with problems in the interim.    Leotis Pain, MD  11/05/2020 10:21 AM    This note was created with Dragon medical transcription system.  Any errors from dictation are purely unintentional

## 2020-11-08 DIAGNOSIS — E785 Hyperlipidemia, unspecified: Secondary | ICD-10-CM | POA: Diagnosis not present

## 2020-11-08 DIAGNOSIS — E1169 Type 2 diabetes mellitus with other specified complication: Secondary | ICD-10-CM | POA: Diagnosis not present

## 2020-11-08 LAB — HEMOGLOBIN A1C: Hemoglobin A1C: 7.7

## 2020-11-22 ENCOUNTER — Telehealth: Payer: Self-pay | Admitting: Family Medicine

## 2020-11-22 ENCOUNTER — Telehealth (INDEPENDENT_AMBULATORY_CARE_PROVIDER_SITE_OTHER): Payer: Self-pay

## 2020-11-22 ENCOUNTER — Telehealth: Payer: Self-pay

## 2020-11-22 ENCOUNTER — Encounter: Payer: Self-pay | Admitting: Family Medicine

## 2020-11-22 ENCOUNTER — Ambulatory Visit (INDEPENDENT_AMBULATORY_CARE_PROVIDER_SITE_OTHER): Payer: Medicare Other | Admitting: Family Medicine

## 2020-11-22 ENCOUNTER — Other Ambulatory Visit: Payer: Self-pay

## 2020-11-22 VITALS — BP 120/70 | HR 112 | Ht 62.0 in | Wt 132.0 lb

## 2020-11-22 DIAGNOSIS — M25552 Pain in left hip: Secondary | ICD-10-CM | POA: Diagnosis not present

## 2020-11-22 MED ORDER — PREDNISONE 10 MG PO TABS: ORAL_TABLET | ORAL | 1 refills | Status: DC

## 2020-11-22 NOTE — Telephone Encounter (Signed)
I called and spoke with the pt and made her aware of the Np's instructions .

## 2020-11-22 NOTE — Telephone Encounter (Signed)
Call pt and schedule for today
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
## 2020-11-22 NOTE — Telephone Encounter (Signed)
She can take ibuprofen with an aneurysm.  A heating pad may also be helpful.

## 2020-11-22 NOTE — Telephone Encounter (Signed)
Pt's granddaughter called and left a VM on the nurses line saying that the pt is having a lot of back pain and and would like to know could she talk IB profen due to her having AAA or just what do we recommend that she take . Please advise.

## 2020-11-22 NOTE — Telephone Encounter (Signed)
Granddaughter calling to report pt. Will try to make appointment this morning.

## 2020-11-22 NOTE — Telephone Encounter (Unsigned)
Copied from [place] [id number]. Topic: General - Other >> Nov 22, 2020  8:31 AM Alanda Slim E wrote: Reason for CRM:  Pt pulled muscle in her back and she wanted to know if [facility] Flattery can send in a muscle relaxer /pt asked to speak with [facility] Flattery please advise

## 2020-11-26 ENCOUNTER — Encounter: Payer: Medicare Other | Admitting: Family Medicine

## 2020-12-31 ENCOUNTER — Ambulatory Visit
Admission: RE | Admit: 2020-12-31 | Discharge: 2020-12-31 | Disposition: A | Discharge status: Home / Self Care | Payer: Medicare Other | Attending: Family Medicine | Admitting: Family Medicine

## 2020-12-31 ENCOUNTER — Encounter: Payer: Self-pay | Admitting: Family Medicine

## 2020-12-31 ENCOUNTER — Other Ambulatory Visit: Payer: Self-pay

## 2020-12-31 ENCOUNTER — Ambulatory Visit
Admission: RE | Admit: 2020-12-31 | Discharge: 2020-12-31 | Disposition: A | Discharge status: Home / Self Care | Payer: Medicare Other | Source: Ambulatory Visit | Attending: Family Medicine | Admitting: Family Medicine

## 2020-12-31 ENCOUNTER — Ambulatory Visit (INDEPENDENT_AMBULATORY_CARE_PROVIDER_SITE_OTHER): Payer: Medicare Other | Admitting: Family Medicine

## 2020-12-31 VITALS — BP 138/80 | HR 60 | Ht 62.0 in | Wt 132.0 lb

## 2020-12-31 DIAGNOSIS — N3281 Overactive bladder: Secondary | ICD-10-CM

## 2020-12-31 DIAGNOSIS — J4520 Mild intermittent asthma, uncomplicated: Secondary | ICD-10-CM | POA: Diagnosis not present

## 2020-12-31 DIAGNOSIS — J449 Chronic obstructive pulmonary disease, unspecified: Secondary | ICD-10-CM | POA: Insufficient documentation

## 2020-12-31 DIAGNOSIS — I10 Essential (primary) hypertension: Secondary | ICD-10-CM

## 2020-12-31 DIAGNOSIS — E7849 Other hyperlipidemia: Secondary | ICD-10-CM

## 2020-12-31 DIAGNOSIS — R0789 Other chest pain: Secondary | ICD-10-CM | POA: Diagnosis not present

## 2020-12-31 DIAGNOSIS — R0600 Dyspnea, unspecified: Secondary | ICD-10-CM | POA: Diagnosis not present

## 2020-12-31 MED ORDER — ATORVASTATIN CALCIUM 10 MG PO TABS: 10.0000 mg | ORAL_TABLET | Freq: Every day | ORAL | 1 refills | Status: DC

## 2020-12-31 MED ORDER — LOSARTAN POTASSIUM 100 MG PO TABS: 100.0000 mg | ORAL_TABLET | Freq: Every day | ORAL | 1 refills | Status: DC

## 2020-12-31 MED ORDER — SPIRONOLACTONE 25 MG PO TABS: 25.0000 mg | ORAL_TABLET | Freq: Every day | ORAL | 1 refills | Status: DC

## 2020-12-31 MED ORDER — AMLODIPINE BESYLATE 2.5 MG PO TABS: 2.5000 mg | ORAL_TABLET | Freq: Every day | ORAL | 1 refills | Status: DC

## 2020-12-31 MED ORDER — OXYBUTYNIN CHLORIDE 5 MG PO TABS: 5.0000 mg | ORAL_TABLET | Freq: Two times a day (BID) | ORAL | 1 refills | Status: DC

## 2020-12-31 MED ORDER — ALBUTEROL SULFATE HFA 108 (90 BASE) MCG/ACT IN AERS: 1.0000 | INHALATION_SPRAY | Freq: Four times a day (QID) | RESPIRATORY_TRACT | 11 refills | Status: DC

## 2020-12-31 NOTE — Progress Notes (Signed)
Date:  12/31/2020   Name:  Jill Guzman   DOB:  [birth year]   MRN:  [id number]   Chief Complaint: Hyperlipidemia, Hypertension, and overactive bladder  Hyperlipidemia This is a chronic problem. The current episode started more than 1 year ago. The problem is controlled. Recent lipid tests were reviewed and are normal. She has no history of chronic renal disease, diabetes, hypothyroidism, liver disease or obesity. Factors aggravating her hyperlipidemia include thiazides. Pertinent negatives include no chest pain or shortness of breath. Current antihyperlipidemic treatment includes statins. The current treatment provides moderate improvement of lipids. There are no compliance problems.  Risk factors for coronary artery disease include dyslipidemia and hypertension.  Hypertension This is a chronic problem. The current episode started more than 1 year ago. The problem has been gradually improving since onset. The problem is controlled. Pertinent negatives include no anxiety, blurred vision, chest pain, headaches, malaise/fatigue, neck pain, orthopnea, palpitations, peripheral edema, PND, shortness of breath or sweats. Past treatments include calcium channel blockers and angiotensin blockers. The current treatment provides moderate improvement. There are no compliance problems.  There is no history of angina, kidney disease, CAD/MI, CVA, heart failure, left ventricular hypertrophy, PVD or retinopathy. There is no history of chronic renal disease, a hypertension causing med or renovascular disease.  Asthma She complains of chest tightness, difficulty breathing, frequent throat clearing and sputum production. There is no shortness of breath. This is a chronic problem. The current episode started more than 1 year ago. The problem has been waxing and waning. Pertinent negatives include no chest pain, headaches, malaise/fatigue, PND or sweats. Her past medical history is significant for asthma.    Lab  Results  Component Value Date   CREATININE 0.67 07/31/2020   BUN 10 11/15/2019   NA 141 11/15/2019   K 4.5 11/15/2019   CL 103 11/15/2019   CO2 23 11/15/2019   Lab Results  Component Value Date   CHOL 195 07/02/2020   HDL 52 07/02/2020   LDLCALC 122 (H) 07/02/2020   TRIG 115 07/02/2020   CHOLHDL 2.9 03/29/2019   Lab Results  Component Value Date   TSH 1.650 03/29/2019   Lab Results  Component Value Date   HGBA1C 7.2 06/10/2020   Lab Results  Component Value Date   WBC 8.3 03/29/2019   HGB 14.1 03/29/2019   HCT 42.9 03/29/2019   MCV 85 03/29/2019   PLT 225 03/29/2019   Lab Results  Component Value Date   ALT 12 11/15/2019   AST 14 11/15/2019   ALKPHOS 53 11/15/2019   BILITOT 0.5 11/15/2019     Review of Systems  Constitutional: Negative for malaise/fatigue.  Eyes: Negative for blurred vision.  Respiratory: Positive for sputum production. Negative for shortness of breath.   Cardiovascular: Negative for chest pain, palpitations, orthopnea and PND.  Musculoskeletal: Negative for neck pain.  Neurological: Negative for headaches.    Patient Active Problem List   Diagnosis Date Noted  . AAA (abdominal aortic aneurysm) without rupture ([place]) 08/09/2020  . 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 Nonexudative age-related macular degeneration, bilateral, early dry stage 03/27/2019  . Recurrent major depressive disorder, in partial remission ([place]) 02/14/2018  . Chronic anxiety 02/09/2017  . AB (asthmatic bronchitis), mild intermittent, uncomplicated 95/28/4132  . Chronic obstructive pulmonary disease ([place]) 07/14/2016  . 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 Routine general medical examination at a health care facility 12/31/2014  .  Diabetes ([place]) 12/31/2014  . BP (high blood pressure) 12/31/2014  . Osteopenia 12/31/2014    Allergies  Allergen Reactions  .  Penicillins Itching  . Sulfa Antibiotics Itching    Past Surgical History:  Procedure Laterality Date  . ABDOMINAL HYSTERECTOMY    . BREAST EXCISIONAL BIOPSY Left 1976   neg  . CATARACT EXTRACTION W/ INTRAOCULAR LENS  IMPLANT, BILATERAL    . COLONOSCOPY  2014   normal  . cyst on bladder removed    . cyst removed breast Left   . KNEE ARTHROSCOPY WITH MEDIAL MENISECTOMY Left 10/20/2019   Procedure: KNEE ARTHROSCOPY WITH PARTIAL MEDIAL MENISECTOMY;  Surgeon: Leim Fabry, MD;  Location: [facility];  Service: Orthopedics;  Laterality: Left;  DIABETIC - oral meds  . PARTIAL HYSTERECTOMY    . TUBAL LIGATION      Social History   Tobacco Use  . Smoking status: Former Smoker    Packs/day: 2.00    Years: 10.00    Pack years: 20.00    Types: Cigarettes    Quit date: 05/11/1999    Years since quitting: 21.6  . Smokeless tobacco: Never Used  . Tobacco comment: Smoking cessation materials not required  Vaping Use  . Vaping Use: Never used  Substance Use Topics  . Alcohol use: No    Alcohol/week: 0.0 standard drinks  . Drug use: No     Medication list has been reviewed and updated.  Current Meds  Medication Sig  . albuterol (VENTOLIN HFA) 108 (90 Base) MCG/ACT inhaler Inhale 1 puff into the lungs QID.  Marland Kitchen amLODipine (NORVASC) 2.5 MG tablet Take 1 tablet (2.5 mg total) by mouth daily.  Marland Kitchen atorvastatin (LIPITOR) 10 MG tablet Take 1 tablet (10 mg total) by mouth daily.  . cetirizine (ZYRTEC) 10 MG tablet Take 1 tablet (10 mg total) by mouth daily.  . Cholecalciferol (VITAMIN D) 50 MCG (2000 UT) CAPS Take by mouth.  Marland Kitchen glucose blood (ACCU-CHEK AVIVA PLUS) test strip USE ONE STRIP TO CHECK GLUCOSE ONCE DAILY  . losartan (COZAAR) 100 MG tablet Take 1 tablet (100 mg total) by mouth daily.  . metFORMIN (GLUCOPHAGE) 500 MG tablet Take 1 tablet (500 mg total) by mouth 2 (two) times daily. (Patient taking differently: Take 500 mg by mouth in the morning, at noon, and at bedtime. 1  tablet 3 times daily)  . Omega 3 1000 MG CAPS Take 1 capsule (1,000 mg total) by mouth daily.  Marland Kitchen spironolactone (ALDACTONE) 25 MG tablet Take 25 mg by mouth daily.  . [DISCONTINUED] predniSONE (DELTASONE) 10 MG tablet Taper 6,6,6,5,5,5,4,4,3,3,2,2,1,1    PHQ 2/9 Scores 12/31/2020 11/22/2020 10/16/2020 08/13/2020  PHQ - 2 Score 0 0 0 0  PHQ- 9 Score 0 0 - 0    GAD 7 : Generalized Anxiety Score 12/31/2020 08/13/2020 07/02/2020 11/15/2019  Nervous, Anxious, on Edge 0 0 1 0  Control/stop worrying 0 0 0 0  Worry too much - different things 0 0 0 0  Trouble relaxing 0 0 0 0  Restless 0 0 0 0  Easily annoyed or irritable 0 0 0 0  Afraid - awful might happen 0 0 0 0  Total GAD 7 Score 0 0 1 0  Anxiety Difficulty - - Not difficult at all -    BP Readings from Last 3 Encounters:  12/31/20 138/80  11/22/20 120/70  11/05/20 131/82    Physical Exam  Wt Readings from Last 3 Encounters:  12/31/20 132 lb (59.9 kg)  11/22/20 132 lb (59.9 kg)  11/05/20 132 lb (59.9 kg)    BP 138/80   Pulse 60   Ht 5\' 2"  (1.575 m)   Wt 132 lb (59.9 kg)   BMI 24.14 kg/m   Assessment and Plan:  1. Essential hypertension Chronic.  Controlled.  Stable.  Blood pressure today is 138/80.  Continue amlodipine 2.5 mg, losartan 100 mg, and spironolactone 25 mg once a day. - Comprehensive Metabolic Panel (CMET) - amLODipine (NORVASC) 2.5 MG tablet; Take 1 tablet (2.5 mg total) by mouth daily.  Dispense: 90 tablet; Refill: 1 - losartan (COZAAR) 100 MG tablet; Take 1 tablet (100 mg total) by mouth daily.  Dispense: 90 tablet; Refill: 1 - spironolactone (ALDACTONE) 25 MG tablet; Take 1 tablet (25 mg total) by mouth daily.  Dispense: 90 tablet; Refill: 1  2. Chronic obstructive pulmonary disease, unspecified COPD type (HCC) Chronic.  Relatively controlled on albuterol.  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 and had 10 years of smoking.  Has a history of asthma and likely COPD.  We  will get a chest x-ray and stable if there is any cardio or pulmonary concerns at this time. - DG Chest 2 View; Future  3. AB (asthmatic bronchitis), mild intermittent, uncomplicated Chronic.  Controlled.  Stable.  Mild and intermittent but uncomplicated.  We will continue albuterol - albuterol (VENTOLIN HFA) 108 (90 Base) MCG/ACT inhaler; Inhale 1 puff into the lungs QID.  Dispense: 6.7 g; Refill: 11  4. Familial multiple lipoprotein-type hyperlipidemia Chronic.  Controlled.  Stable.  Continue atorvastatin 10 mg once a day. - atorvastatin (LIPITOR) 10 MG tablet; Take 1 tablet (10 mg total) by mouth daily.  Dispense: 90 tablet; Refill: 1  5. Overactive bladder Chronic.  Controlled.  Stable.  Continue Myrbetriq 5 mg twice a day. - oxybutynin (DITROPAN) 5 MG tablet; Take 1 tablet (5 mg total) by mouth 2 (two) times daily.  Dispense: 60 tablet; Refill: 1

## 2021-01-01 LAB — COMPREHENSIVE METABOLIC PANEL
ALT: 13 IU/L (ref 0–32)
AST: 10 IU/L (ref 0–40)
Albumin/Globulin Ratio: 1.9 (ref 1.2–2.2)
Albumin: 4.6 g/dL (ref 3.7–4.7)
Alkaline Phosphatase: 51 IU/L (ref 44–121)
BUN/Creatinine Ratio: 16 (ref 12–28)
BUN: 11 mg/dL (ref 8–27)
Bilirubin Total: 0.5 mg/dL (ref 0.0–1.2)
CO2: 22 mmol/L (ref 20–29)
Calcium: 10.8 mg/dL — ABNORMAL HIGH (ref 8.7–10.3)
Chloride: 104 mmol/L (ref 96–106)
Creatinine, Ser: 0.69 mg/dL (ref 0.57–1.00)
Globulin, Total: 2.4 g/dL (ref 1.5–4.5)
Glucose: 126 mg/dL — ABNORMAL HIGH (ref 65–99)
Potassium: 4.5 mmol/L (ref 3.5–5.2)
Sodium: 142 mmol/L (ref 134–144)
Total Protein: 7 g/dL (ref 6.0–8.5)
eGFR: 89 mL/min/{1.73_m2} (ref >59)

## 2021-01-08 ENCOUNTER — Ambulatory Visit (INDEPENDENT_AMBULATORY_CARE_PROVIDER_SITE_OTHER): Payer: Medicare Other | Admitting: Family Medicine

## 2021-01-08 ENCOUNTER — Encounter: Payer: Self-pay | Admitting: Family Medicine

## 2021-01-08 ENCOUNTER — Other Ambulatory Visit: Payer: Self-pay

## 2021-01-08 VITALS — BP 130/80 | HR 88 | Ht 62.0 in | Wt 131.0 lb

## 2021-01-08 DIAGNOSIS — J449 Chronic obstructive pulmonary disease, unspecified: Secondary | ICD-10-CM

## 2021-01-08 DIAGNOSIS — J4520 Mild intermittent asthma, uncomplicated: Secondary | ICD-10-CM

## 2021-01-08 MED ORDER — ALBUTEROL SULFATE HFA 108 (90 BASE) MCG/ACT IN AERS: 1.0000 | INHALATION_SPRAY | Freq: Four times a day (QID) | RESPIRATORY_TRACT | 11 refills | Status: DC

## 2021-01-08 NOTE — Progress Notes (Signed)
Date:  01/08/2021   Name:  Jill Guzman   DOB:  1940-10-24   MRN:  [id number]   Chief Complaint: Follow-up (Wanted to listen to lungs after chest xray)  Shortness of Breath This is a chronic problem. The current episode started more than 1 year ago. The problem has been waxing and waning. Associated symptoms include sputum production. Pertinent negatives include no abdominal pain, chest pain, claudication, coryza, ear pain, fever, headaches, hemoptysis, leg pain, leg swelling, neck pain, orthopnea, PND, rash, rhinorrhea, sore throat, swollen glands, syncope, vomiting or wheezing. The symptoms are aggravated by weather changes.    Lab Results  Component Value Date   CREATININE 0.69 12/31/2020   BUN 11 12/31/2020   NA 142 12/31/2020   K 4.5 12/31/2020   CL 104 12/31/2020   CO2 22 12/31/2020   Lab Results  Component Value Date   CHOL 195 07/02/2020   HDL 52 07/02/2020   LDLCALC 122 (H) 07/02/2020   TRIG 115 07/02/2020   CHOLHDL 2.9 03/29/2019   Lab Results  Component Value Date   TSH 1.650 03/29/2019   Lab Results  Component Value Date   HGBA1C 7.2 06/10/2020   Lab Results  Component Value Date   WBC 8.3 03/29/2019   HGB 14.1 03/29/2019   HCT 42.9 03/29/2019   MCV 85 03/29/2019   PLT 225 03/29/2019   Lab Results  Component Value Date   ALT 13 12/31/2020   AST 10 12/31/2020   ALKPHOS 51 12/31/2020   BILITOT 0.5 12/31/2020     Review of Systems  Constitutional: Negative for fever.  HENT: Negative for ear pain, rhinorrhea and sore throat.   Respiratory: Positive for sputum production and shortness of breath. Negative for hemoptysis and wheezing.   Cardiovascular: Negative for chest pain, orthopnea, claudication, leg swelling, syncope and PND.  Gastrointestinal: Negative for abdominal pain and vomiting.  Musculoskeletal: Negative for neck pain.  Skin: Negative for rash.  Neurological: Negative for headaches.    Patient Active Problem List   Diagnosis  Date Noted  . AAA (abdominal aortic aneurysm) without rupture ([place]) 08/09/2020  . 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 Nonexudative age-related macular degeneration, bilateral, early dry stage 03/27/2019  . Recurrent major depressive disorder, in partial remission ([place]) 02/14/2018  . Chronic anxiety 02/09/2017  . AB (asthmatic bronchitis), mild intermittent, uncomplicated 67/34/1937  . Chronic obstructive pulmonary disease ([place]) 07/14/2016  . 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 Routine general medical examination at a health care facility 12/31/2014  . Diabetes ([place]) 12/31/2014  . BP (high blood pressure) 12/31/2014  . Osteopenia 12/31/2014    Allergies  Allergen Reactions  . Penicillins Itching  . Sulfa Antibiotics Itching    Past Surgical History:  Procedure Laterality Date  . ABDOMINAL HYSTERECTOMY    . BREAST EXCISIONAL BIOPSY Left 1976   neg  . CATARACT EXTRACTION W/ INTRAOCULAR LENS  IMPLANT, BILATERAL    . COLONOSCOPY  2014   normal  . cyst on bladder removed    . cyst removed breast Left   . KNEE ARTHROSCOPY WITH MEDIAL MENISECTOMY Left 10/20/2019   Procedure: KNEE ARTHROSCOPY WITH PARTIAL MEDIAL MENISECTOMY;  Surgeon: Leim Fabry, MD;  Location: [facility];  Service: Orthopedics;  Laterality: Left;  DIABETIC - oral meds  . PARTIAL HYSTERECTOMY    . TUBAL LIGATION      Social History   Tobacco Use  .  Smoking status: Former Smoker    Packs/day: 2.00    Years: 10.00    Pack years: 20.00    Types: Cigarettes    Quit date: 05/11/1999    Years since quitting: 21.6  . Smokeless tobacco: Never Used  . Tobacco comment: Smoking cessation materials not required  Vaping Use  . Vaping Use: Never used  Substance Use Topics  . Alcohol use: No    Alcohol/week: 0.0 standard drinks  . Drug use: No     Medication list has been reviewed and  updated.  Current Meds  Medication Sig  . albuterol (VENTOLIN HFA) 108 (90 Base) MCG/ACT inhaler Inhale 1 puff into the lungs QID.  Marland Kitchen amLODipine (NORVASC) 2.5 MG tablet Take 1 tablet (2.5 mg total) by mouth daily.  Marland Kitchen atorvastatin (LIPITOR) 10 MG tablet Take 1 tablet (10 mg total) by mouth daily.  . cetirizine (ZYRTEC) 10 MG tablet Take 1 tablet (10 mg total) by mouth daily.  . Cholecalciferol (VITAMIN D) 50 MCG (2000 UT) CAPS Take by mouth.  Marland Kitchen glucose blood (ACCU-CHEK AVIVA PLUS) test strip USE ONE STRIP TO CHECK GLUCOSE ONCE DAILY  . losartan (COZAAR) 100 MG tablet Take 1 tablet (100 mg total) by mouth daily.  . metFORMIN (GLUCOPHAGE) 500 MG tablet Take 1 tablet (500 mg total) by mouth 2 (two) times daily. (Patient taking differently: Take 500 mg by mouth in the morning, at noon, and at bedtime. 1 tablet 3 times daily)  . Omega 3 1000 MG CAPS Take 1 capsule (1,000 mg total) by mouth daily.  Marland Kitchen oxybutynin (DITROPAN) 5 MG tablet Take 1 tablet (5 mg total) by mouth 2 (two) times daily.  Marland Kitchen spironolactone (ALDACTONE) 25 MG tablet Take 1 tablet (25 mg total) by mouth daily.    PHQ 2/9 Scores 12/31/2020 11/22/2020 10/16/2020 08/13/2020  PHQ - 2 Score 0 0 0 0  PHQ- 9 Score 0 0 - 0    GAD 7 : Generalized Anxiety Score 12/31/2020 08/13/2020 07/02/2020 11/15/2019  Nervous, Anxious, on Edge 0 0 1 0  Control/stop worrying 0 0 0 0  Worry too much - different things 0 0 0 0  Trouble relaxing 0 0 0 0  Restless 0 0 0 0  Easily annoyed or irritable 0 0 0 0  Afraid - awful might happen 0 0 0 0  Total GAD 7 Score 0 0 1 0  Anxiety Difficulty - - Not difficult at all -    BP Readings from Last 3 Encounters:  01/08/21 130/80  12/31/20 138/80  11/22/20 120/70    Physical Exam  Wt Readings from Last 3 Encounters:  01/08/21 131 lb (59.4 kg)  12/31/20 132 lb (59.9 kg)  11/22/20 132 lb (59.9 kg)    BP 130/80   Pulse 88   Ht 5\' 2"  (1.575 m)   Wt 131 lb (59.4 kg)   BMI 23.96 kg/m   Assessment and  Plan:  1. Chronic obstructive pulmonary disease, unspecified COPD type (HCC) Chronic.  Persistent.  Improved.  Stable.  Patient is coming off prednisone from other circumstances as well as has continued her beta agonist as Ventolin 1 to 2 puffs every 6 hours.  Patient is gradually improving but would benefit from additional pulmonary regimen.  We will initiate Trelegy 1 puff daily and patient has been given to 2-week sample packs and will return in 4 weeks at which time we will reevaluate her COPD symptomatology.  2. AB (asthmatic bronchitis), mild intermittent, uncomplicated Patient has  a productive cough but it is minimal which is some more associated with of bronchitis and we will continue to pursue and a bronchodilation direction as that there is no indication for antibiotics at this time. - albuterol (VENTOLIN HFA) 108 (90 Base) MCG/ACT inhaler; Inhale 1 puff into the lungs QID.  Dispense: 6.7 g; Refill: 11

## 2021-02-05 ENCOUNTER — Ambulatory Visit (INDEPENDENT_AMBULATORY_CARE_PROVIDER_SITE_OTHER): Payer: Medicare Other | Admitting: Family Medicine

## 2021-02-05 ENCOUNTER — Other Ambulatory Visit: Payer: Self-pay

## 2021-02-05 ENCOUNTER — Encounter: Payer: Self-pay | Admitting: Family Medicine

## 2021-02-05 VITALS — BP 120/80 | HR 84 | Ht 61.0 in | Wt 132.0 lb

## 2021-02-05 DIAGNOSIS — J449 Chronic obstructive pulmonary disease, unspecified: Secondary | ICD-10-CM

## 2021-02-05 MED ORDER — BUDESONIDE-FORMOTEROL FUMARATE 80-4.5 MCG/ACT IN AERO
2.0000 | INHALATION_SPRAY | Freq: Two times a day (BID) | RESPIRATORY_TRACT | 12 refills | Status: DC
Start: 2021-02-05 — End: 2021-12-23

## 2021-02-05 NOTE — Progress Notes (Signed)
Date:  02/05/2021   Name:  Jill Guzman   DOB:  Mar 09, 1941   MRN:  [id number]   Chief Complaint: COPD (Follow up after starting Trelegy sample- working good, but does not know if she will be able to afford the inhaler)  COPD She complains of shortness of breath and wheezing. There is no chest tightness, cough, difficulty breathing, frequent throat clearing, hemoptysis, hoarse voice or sputum production. Primary symptoms comments: Since trelegy. This is a chronic problem. The current episode started more than 1 year ago. The problem occurs daily. Associated symptoms include nasal congestion. Pertinent negatives include no appetite change, chest pain, dyspnea on exertion, ear congestion, ear pain, fever, headaches, heartburn, malaise/fatigue, myalgias, orthopnea, PND, postnasal drip, rhinorrhea, sneezing, sore throat, sweats, trouble swallowing or weight loss. Her symptoms are aggravated by nothing. Her symptoms are alleviated by beta-agonist, ipratropium and steroid inhaler. She reports significant improvement on treatment. Her past medical history is significant for COPD and emphysema.    Lab Results  Component Value Date   CREATININE 0.69 12/31/2020   BUN 11 12/31/2020   NA 142 12/31/2020   K 4.5 12/31/2020   CL 104 12/31/2020   CO2 22 12/31/2020   Lab Results  Component Value Date   CHOL 195 07/02/2020   HDL 52 07/02/2020   LDLCALC 122 (H) 07/02/2020   TRIG 115 07/02/2020   CHOLHDL 2.9 03/29/2019   Lab Results  Component Value Date   TSH 1.650 03/29/2019   Lab Results  Component Value Date   HGBA1C 7.7 11/08/2020   Lab Results  Component Value Date   WBC 8.3 03/29/2019   HGB 14.1 03/29/2019   HCT 42.9 03/29/2019   MCV 85 03/29/2019   PLT 225 03/29/2019   Lab Results  Component Value Date   ALT 13 12/31/2020   AST 10 12/31/2020   ALKPHOS 51 12/31/2020   BILITOT 0.5 12/31/2020     Review of Systems  Constitutional: Negative.  Negative for appetite change,  chills, fatigue, fever, malaise/fatigue, unexpected weight change and weight loss.  HENT: Negative for congestion, ear discharge, ear pain, hoarse voice, postnasal drip, rhinorrhea, sinus pressure, sneezing, sore throat and trouble swallowing.   Eyes: Negative for photophobia, pain, discharge, redness and itching.  Respiratory: Positive for shortness of breath and wheezing. Negative for cough, hemoptysis, sputum production and stridor.   Cardiovascular: Negative for chest pain, dyspnea on exertion and PND.  Gastrointestinal: Negative for abdominal pain, blood in stool, constipation, diarrhea, heartburn, nausea and vomiting.  Endocrine: Negative for cold intolerance, heat intolerance, polydipsia, polyphagia and polyuria.  Genitourinary: Negative for dysuria, flank pain, frequency, hematuria, menstrual problem, pelvic pain, urgency, vaginal bleeding and vaginal discharge.  Musculoskeletal: Negative for arthralgias, back pain and myalgias.  Skin: Negative for rash.  Allergic/Immunologic: Negative for environmental allergies and food allergies.  Neurological: Negative for dizziness, weakness, light-headedness, numbness and headaches.  Hematological: Negative for adenopathy. Does not bruise/bleed easily.  Psychiatric/Behavioral: Negative for dysphoric mood. The patient is not nervous/anxious.     Patient Active Problem List   Diagnosis Date Noted  . 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 Nonexudative age-related macular degeneration, bilateral, early dry stage 03/27/2019  . Recurrent major depressive disorder, in partial remission ([place]) 02/14/2018  . Chronic anxiety 02/09/2017  . AB (asthmatic bronchitis), mild intermittent, uncomplicated 25/42/7062  . Chronic obstructive pulmonary disease ([place]) 07/14/2016  . 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 Clinical  depression 12/31/2014  . AB (asthmatic  bronchitis) 12/31/2014  . Routine general medical examination at a health care facility 12/31/2014  . Diabetes ([place]) 12/31/2014  . BP (high blood pressure) 12/31/2014  . Osteopenia 12/31/2014    Allergies  Allergen Reactions  . Penicillins Itching  . Sulfa Antibiotics Itching    Past Surgical History:  Procedure Laterality Date  . ABDOMINAL HYSTERECTOMY    . BREAST EXCISIONAL BIOPSY Left 1976   neg  . CATARACT EXTRACTION W/ INTRAOCULAR LENS  IMPLANT, BILATERAL    . COLONOSCOPY  2014   normal  . cyst on bladder removed    . cyst removed breast Left   . KNEE ARTHROSCOPY WITH MEDIAL MENISECTOMY Left 10/20/2019   Procedure: KNEE ARTHROSCOPY WITH PARTIAL MEDIAL MENISECTOMY;  Surgeon: Leim Fabry, MD;  Location: [facility];  Service: Orthopedics;  Laterality: Left;  DIABETIC - oral meds  . PARTIAL HYSTERECTOMY    . TUBAL LIGATION      Social History   Tobacco Use  . Smoking status: Former Smoker    Packs/day: 2.00    Years: 10.00    Pack years: 20.00    Types: Cigarettes    Quit date: 05/11/1999    Years since quitting: 21.7  . Smokeless tobacco: Never Used  . Tobacco comment: Smoking cessation materials not required  Vaping Use  . Vaping Use: Never used  Substance Use Topics  . Alcohol use: No    Alcohol/week: 0.0 standard drinks  . Drug use: No     Medication list has been reviewed and updated.  Current Meds  Medication Sig  . albuterol (VENTOLIN HFA) 108 (90 Base) MCG/ACT inhaler Inhale 1 puff into the lungs QID.  Marland Kitchen amLODipine (NORVASC) 2.5 MG tablet Take 1 tablet (2.5 mg total) by mouth daily.  Marland Kitchen atorvastatin (LIPITOR) 10 MG tablet Take 1 tablet (10 mg total) by mouth daily.  . cetirizine (ZYRTEC) 10 MG tablet Take 1 tablet (10 mg total) by mouth daily.  . Cholecalciferol (VITAMIN D) 50 MCG (2000 UT) CAPS Take by mouth.  . Fluticasone-Umeclidin-Vilant (TRELEGY ELLIPTA) 100-62.5-25 MCG/INH AEPB Inhale 1 puff into the lungs daily.  Marland Kitchen glucose blood  (ACCU-CHEK AVIVA PLUS) test strip USE ONE STRIP TO CHECK GLUCOSE ONCE DAILY  . losartan (COZAAR) 100 MG tablet Take 1 tablet (100 mg total) by mouth daily.  . metFORMIN (GLUCOPHAGE) 500 MG tablet Take 1 tablet (500 mg total) by mouth 2 (two) times daily. (Patient taking differently: Take 1,500 mg by mouth daily with breakfast.)  . Omega 3 1000 MG CAPS Take 1 capsule (1,000 mg total) by mouth daily.  Marland Kitchen oxybutynin (DITROPAN) 5 MG tablet Take 1 tablet (5 mg total) by mouth 2 (two) times daily.  Marland Kitchen spironolactone (ALDACTONE) 25 MG tablet Take 1 tablet (25 mg total) by mouth daily.    PHQ 2/9 Scores 02/05/2021 12/31/2020 11/22/2020 10/16/2020  PHQ - 2 Score 0 0 0 0  PHQ- 9 Score 0 0 0 -    GAD 7 : Generalized Anxiety Score 02/05/2021 12/31/2020 08/13/2020 07/02/2020  Nervous, Anxious, on Edge 0 0 0 1  Control/stop worrying 0 0 0 0  Worry too much - different things 0 0 0 0  Trouble relaxing 0 0 0 0  Restless 0 0 0 0  Easily annoyed or irritable 0 0 0 0  Afraid - awful might happen 0 0 0 0  Total GAD 7 Score 0 0 0 1  Anxiety Difficulty - - - Not difficult  at all    BP Readings from Last 3 Encounters:  02/05/21 120/80  01/08/21 130/80  12/31/20 138/80    Physical Exam Vitals and nursing note reviewed.  Constitutional:      Appearance: She is well-developed.  HENT:     Head: Normocephalic.     Right Ear: External ear normal.     Left Ear: External ear normal.     Nose: Nose normal.  Eyes:     General: Lids are everted, no foreign bodies appreciated. No scleral icterus.       Left eye: No foreign body or hordeolum.     Conjunctiva/sclera: Conjunctivae normal.     Right eye: Right conjunctiva is not injected.     Left eye: Left conjunctiva is not injected.     Pupils: Pupils are equal, round, and reactive to light.  Neck:     Thyroid: No thyromegaly.     Vascular: No JVD.     Trachea: No tracheal deviation.  Cardiovascular:     Rate and Rhythm: Normal rate and regular rhythm.      Heart sounds: Normal heart sounds. No murmur heard. No friction rub. No gallop.   Pulmonary:     Effort: Pulmonary effort is normal. No respiratory distress.     Breath sounds: Normal breath sounds. No decreased breath sounds, wheezing, rhonchi or rales.  Abdominal:     General: Bowel sounds are normal.     Palpations: Abdomen is soft. There is no mass.     Tenderness: There is no abdominal tenderness. There is no guarding or rebound.  Musculoskeletal:        General: No tenderness. Normal range of motion.     Cervical back: Normal range of motion and neck supple.  Lymphadenopathy:     Cervical: No cervical adenopathy.  Skin:    General: Skin is warm.     Findings: No rash.  Neurological:     Mental Status: She is alert and oriented to person, place, and time.     Cranial Nerves: No cranial nerve deficit.     Deep Tendon Reflexes: Reflexes normal.  Psychiatric:        Mood and Affect: Mood is not anxious or depressed.     Wt Readings from Last 3 Encounters:  02/05/21 132 lb (59.9 kg)  01/08/21 131 lb (59.4 kg)  12/31/20 132 lb (59.9 kg)    BP 120/80   Pulse 84   Ht 5\' 1"  (1.549 m)   Wt 132 lb (59.9 kg)   BMI 24.94 kg/m   Assessment and Plan: 1. Chronic obstructive pulmonary disease, unspecified COPD type (HCC) Chronic.  Controlled on Trelegy.  Stable.  Patient did very well on the Trelegy but the expense probably will prohibit her being able to obtain this medication.  Therefore we will continue with her albuterol inhaler 1 to 2 puffs every 6 hours for short acting control and Symbicort 80-4.51 to 2 puffs twice a day.  If this does not work we may add Spiriva as her next step. - budesonide-formoterol (SYMBICORT) 80-4.5 MCG/ACT inhaler; Inhale 2 puffs into the lungs 2 (two) times daily.  Dispense: 1 each; Refill: 12

## 2021-02-18 ENCOUNTER — Ambulatory Visit: Payer: Medicare Other | Admitting: Family Medicine

## 2021-03-19 DIAGNOSIS — E119 Type 2 diabetes mellitus without complications: Secondary | ICD-10-CM | POA: Diagnosis not present

## 2021-03-19 DIAGNOSIS — H353131 Nonexudative age-related macular degeneration, bilateral, early dry stage: Secondary | ICD-10-CM | POA: Diagnosis not present

## 2021-03-19 LAB — HM DIABETES EYE EXAM

## 2021-05-13 ENCOUNTER — Ambulatory Visit (INDEPENDENT_AMBULATORY_CARE_PROVIDER_SITE_OTHER): Payer: Medicare Other | Admitting: Vascular Surgery

## 2021-05-13 ENCOUNTER — Encounter (INDEPENDENT_AMBULATORY_CARE_PROVIDER_SITE_OTHER): Payer: Self-pay | Admitting: Vascular Surgery

## 2021-05-13 ENCOUNTER — Other Ambulatory Visit: Payer: Self-pay

## 2021-05-13 ENCOUNTER — Ambulatory Visit (INDEPENDENT_AMBULATORY_CARE_PROVIDER_SITE_OTHER): Payer: Medicare Other

## 2021-05-13 VITALS — BP 162/82 | HR 76 | Resp 16 | Wt 130.8 lb

## 2021-05-13 DIAGNOSIS — I714 Abdominal aortic aneurysm, without rupture, unspecified: Secondary | ICD-10-CM

## 2021-05-13 DIAGNOSIS — E119 Type 2 diabetes mellitus without complications: Secondary | ICD-10-CM | POA: Diagnosis not present

## 2021-05-13 DIAGNOSIS — I10 Essential (primary) hypertension: Secondary | ICD-10-CM

## 2021-05-13 NOTE — Assessment & Plan Note (Signed)
Duplex today shows stable 4.5 cm infrarenal abdominal aortic aneurysm.  No symptoms.  We will continue to monitor this on 21-month intervals with duplex.

## 2021-05-13 NOTE — Progress Notes (Signed)
MRN : [id number]  Jill Guzman is a 80 y.o. (July 21, 1941) female who presents with chief complaint of  Chief Complaint  Patient presents with   Follow-up    Ultrasound follow up  .  History of Present Illness: Patient returns today in follow up of her AAA.  She is doing well without any aneurysm related symptoms. Specifically, the patient denies new back or abdominal pain, or signs of peripheral embolization Duplex today shows stable 4.5 cm infrarenal abdominal aortic aneurysm.  Current Outpatient Medications  Medication Sig Dispense Refill   albuterol (VENTOLIN HFA) 108 (90 Base) MCG/ACT inhaler Inhale 1 puff into the lungs QID. 6.7 g 11   amLODipine (NORVASC) 2.5 MG tablet Take 1 tablet (2.5 mg total) by mouth daily. 90 tablet 1   atorvastatin (LIPITOR) 10 MG tablet Take 1 tablet (10 mg total) by mouth daily. 90 tablet 1   budesonide-formoterol (SYMBICORT) 80-4.5 MCG/ACT inhaler Inhale 2 puffs into the lungs 2 (two) times daily. 1 each 12   cetirizine (ZYRTEC) 10 MG tablet Take 1 tablet (10 mg total) by mouth daily. 30 tablet 2   Cholecalciferol (VITAMIN D) 50 MCG (2000 UT) CAPS Take by mouth.     glucose blood (ACCU-CHEK AVIVA PLUS) test strip USE ONE STRIP TO CHECK GLUCOSE ONCE DAILY 50 each 11   losartan (COZAAR) 100 MG tablet Take 1 tablet (100 mg total) by mouth daily. 90 tablet 1   metFORMIN (GLUCOPHAGE) 500 MG tablet Take 1 tablet (500 mg total) by mouth 2 (two) times daily. (Patient taking differently: Take 1,500 mg by mouth daily with breakfast.) 180 tablet 6   Omega 3 1000 MG CAPS Take 1 capsule (1,000 mg total) by mouth daily.     spironolactone (ALDACTONE) 25 MG tablet Take 1 tablet (25 mg total) by mouth daily. 90 tablet 1   oxybutynin (DITROPAN) 5 MG tablet Take 1 tablet (5 mg total) by mouth 2 (two) times daily. (Patient not taking: Reported on 05/13/2021) 60 tablet 1   No current facility-administered medications for this visit.    Past Medical History:   Diagnosis Date   Anxiety    Aortic aneurysm ([place])    Asthma    Bell's palsy    age 33 and age 63    COPD (chronic obstructive pulmonary disease) ([place])    Deaf, right    Depression    Diabetes mellitus without complication ([place])    Hearing aid worn    left   Hyperlipidemia    Hypertension    Shingles 08/13/2020   Varicose veins of legs    Vertigo    random, approx 1x/month   Wears dentures    full upper and lower    Past Surgical History:  Procedure Laterality Date   ABDOMINAL HYSTERECTOMY     BREAST EXCISIONAL BIOPSY Left 1976   neg   CATARACT EXTRACTION W/ INTRAOCULAR LENS  IMPLANT, BILATERAL     COLONOSCOPY  2014   normal   cyst on bladder removed     cyst removed breast Left    KNEE ARTHROSCOPY WITH MEDIAL MENISECTOMY Left 10/20/2019   Procedure: KNEE ARTHROSCOPY WITH PARTIAL MEDIAL MENISECTOMY;  Surgeon: Leim Fabry, MD;  Location: [facility];  Service: Orthopedics;  Laterality: Left;  DIABETIC - oral meds   PARTIAL HYSTERECTOMY     TUBAL LIGATION       Social History   Tobacco Use   Smoking status: Former    Packs/day: 2.00  Years: 10.00    Pack years: 20.00    Types: Cigarettes    Quit date: 05/11/1999    Years since quitting: 22.0   Smokeless tobacco: Never   Tobacco comments:    Smoking cessation materials not required  Vaping Use   Vaping Use: Never used  Substance Use Topics   Alcohol use: No    Alcohol/week: 0.0 standard drinks   Drug use: No      Family History  Problem Relation Age of Onset   Diabetes Mother    Stroke Mother    Healthy Daughter    Cancer Son 82       lung   Hypertension Son    Rheum arthritis Daughter    Hypertension Daughter    Arthritis Daughter    COPD Son    Breast cancer Neg Hx      Allergies  Allergen Reactions   Penicillins Itching   Sulfa Antibiotics Itching    REVIEW OF SYSTEMS (Negative unless checked)   Constitutional: [] Weight loss  [] Fever  [] Chills Cardiac: [] Chest pain    [] Chest pressure   [] Palpitations   [] Shortness of breath when laying flat   [] Shortness of breath at rest   [] Shortness of breath with exertion. Vascular:  [] Pain in legs with walking   [] Pain in legs at rest   [] Pain in legs when laying flat   [] Claudication   [] Pain in feet when walking  [] Pain in feet at rest  [] Pain in feet when laying flat   [] History of DVT   [] Phlebitis   [] Swelling in legs   [x] Varicose veins   [] Non-healing ulcers Pulmonary:   [] Uses home oxygen   [] Productive cough   [] Hemoptysis   [] Wheeze  [] COPD   [x] Asthma Neurologic:  [x] Dizziness  [] Blackouts   [] Seizures   [] History of stroke   [] History of TIA  [] Aphasia   [] Temporary blindness   [] Dysphagia   [] Weakness or numbness in arms   [] Weakness or numbness in legs Musculoskeletal:  [x] Arthritis   [] Joint swelling   [] Joint pain   [] Low back pain Hematologic:  [] Easy bruising  [] Easy bleeding   [] Hypercoagulable state   [] Anemic  [] Hepatitis Gastrointestinal:  [] Blood in stool   [] Vomiting blood  [] Gastroesophageal reflux/heartburn   [] Abdominal pain Genitourinary:  [] Chronic kidney disease   [] Difficult urination  [] Frequent urination  [] Burning with urination   [] Hematuria Skin:  [] Rashes   [] Ulcers   [] Wounds Psychological:  [x] History of anxiety   []  History of major depression.  Physical Examination  BP (!) 162/82 (BP Location: Left Arm)   Pulse 76   Resp 16   Wt 130 lb 12.8 oz (59.3 kg)   BMI 24.71 kg/m  Gen:  WD/WN, NAD Head: [place]/AT, No temporalis wasting. Ear/Nose/Throat: Hearing grossly intact, nares w/o erythema or drainage Eyes: Conjunctiva clear. Sclera non-icteric Neck: Supple.  Trachea midline Pulmonary:  Good air movement, no use of accessory muscles.  Cardiac: RRR, no JVD Vascular:  Vessel Right Left  Radial Palpable Palpable                                   Gastrointestinal: soft, non-tender/non-distended. No guarding/reflex. Increased aortic impulse Musculoskeletal: M/S 5/5  throughout.  No deformity or atrophy. No edema. Neurologic: Sensation grossly intact in extremities.  Symmetrical.  Speech is fluent.  Psychiatric: Judgment intact, Mood & affect appropriate for pt's clinical situation. Dermatologic: No rashes or ulcers noted.  No cellulitis or open wounds.      Labs Recent Results (from the past 2160 hour(s))  HM DIABETES EYE EXAM     Status: None   Collection Time: 03/19/21 12:00 AM  Result Value Ref Range   HM Diabetic Eye Exam No Retinopathy No Retinopathy    Radiology No results found.  Assessment/Plan Diabetes blood glucose control important in reducing the progression of atherosclerotic disease. Also, involved in wound healing. On appropriate medications.     BP (high blood pressure) blood pressure control important in reducing the progression of atherosclerotic disease and aneurysmal growth. On appropriate oral medications.  AAA (abdominal aortic aneurysm) without rupture (HCC) Duplex today shows stable 4.5 cm infrarenal abdominal aortic aneurysm.  No symptoms.  We will continue to monitor this on 64-month intervals with duplex.    Leotis Pain, MD  05/13/2021 11:09 AM    This note was created with Dragon medical transcription system.  Any errors from dictation are purely unintentional

## 2021-05-22 ENCOUNTER — Other Ambulatory Visit: Payer: Self-pay | Admitting: Family Medicine

## 2021-05-22 DIAGNOSIS — N3281 Overactive bladder: Secondary | ICD-10-CM

## 2021-06-13 DIAGNOSIS — E1169 Type 2 diabetes mellitus with other specified complication: Secondary | ICD-10-CM | POA: Diagnosis not present

## 2021-06-13 DIAGNOSIS — M810 Age-related osteoporosis without current pathological fracture: Secondary | ICD-10-CM | POA: Diagnosis not present

## 2021-06-13 DIAGNOSIS — E785 Hyperlipidemia, unspecified: Secondary | ICD-10-CM | POA: Diagnosis not present

## 2021-06-13 LAB — HEPATIC FUNCTION PANEL
ALT: 9 (ref 7–35)
AST: 10 — AB (ref 13–35)

## 2021-06-13 LAB — BASIC METABOLIC PANEL
BUN: 18 (ref 4–21)
Creatinine: 0.7 (ref 0.5–1.1)
Glucose: 139
Potassium: 4.8 (ref 3.4–5.3)
Sodium: 135 — AB (ref 137–147)
Sodium: 139 (ref 137–147)

## 2021-06-13 LAB — MICROALBUMIN, URINE: Microalb, Ur: 10.7

## 2021-06-13 LAB — HEMOGLOBIN A1C: Hemoglobin A1C: 7.4

## 2021-06-13 LAB — TSH: TSH: 1.69 (ref 0.41–5.90)

## 2021-06-13 LAB — LIPID PANEL
Cholesterol: 175 (ref 0–200)
HDL: 49 (ref 35–70)
LDL Cholesterol: 110
Triglycerides: 84 (ref 40–160)

## 2021-06-24 ENCOUNTER — Ambulatory Visit (INDEPENDENT_AMBULATORY_CARE_PROVIDER_SITE_OTHER): Payer: Medicare Other | Admitting: Family Medicine

## 2021-06-24 ENCOUNTER — Ambulatory Visit
Admission: RE | Admit: 2021-06-24 | Discharge: 2021-06-24 | Disposition: A | Discharge status: Home / Self Care | Payer: Medicare Other | Source: Ambulatory Visit | Attending: Family Medicine | Admitting: Family Medicine

## 2021-06-24 ENCOUNTER — Other Ambulatory Visit: Payer: Self-pay

## 2021-06-24 ENCOUNTER — Encounter: Payer: Self-pay | Admitting: Family Medicine

## 2021-06-24 ENCOUNTER — Ambulatory Visit
Admission: RE | Admit: 2021-06-24 | Discharge: 2021-06-24 | Disposition: A | Discharge status: Home / Self Care | Payer: Medicare Other | Attending: Family Medicine | Admitting: Family Medicine

## 2021-06-24 VITALS — BP 120/80 | HR 72 | Ht 61.0 in | Wt 131.0 lb

## 2021-06-24 DIAGNOSIS — J0100 Acute maxillary sinusitis, unspecified: Secondary | ICD-10-CM

## 2021-06-24 DIAGNOSIS — I10 Essential (primary) hypertension: Secondary | ICD-10-CM | POA: Diagnosis not present

## 2021-06-24 DIAGNOSIS — R0981 Nasal congestion: Secondary | ICD-10-CM | POA: Diagnosis not present

## 2021-06-24 DIAGNOSIS — N3281 Overactive bladder: Secondary | ICD-10-CM

## 2021-06-24 DIAGNOSIS — R42 Dizziness and giddiness: Secondary | ICD-10-CM | POA: Diagnosis not present

## 2021-06-24 DIAGNOSIS — E7849 Other hyperlipidemia: Secondary | ICD-10-CM | POA: Diagnosis not present

## 2021-06-24 DIAGNOSIS — J301 Allergic rhinitis due to pollen: Secondary | ICD-10-CM | POA: Insufficient documentation

## 2021-06-24 MED ORDER — AMLODIPINE BESYLATE 2.5 MG PO TABS: 2.5000 mg | ORAL_TABLET | Freq: Every day | ORAL | 1 refills | Status: DC

## 2021-06-24 MED ORDER — MONTELUKAST SODIUM 10 MG PO TABS
10.0000 mg | ORAL_TABLET | Freq: Every day | ORAL | 5 refills | Status: DC
Start: 2021-06-24 — End: 2021-12-23

## 2021-06-24 MED ORDER — FLUTICASONE PROPIONATE 50 MCG/ACT NA SUSP: 2.0000 | Freq: Every day | NASAL | 6 refills | Status: DC

## 2021-06-24 MED ORDER — OXYBUTYNIN CHLORIDE 5 MG PO TABS: 5.0000 mg | ORAL_TABLET | Freq: Two times a day (BID) | ORAL | 2 refills | Status: DC

## 2021-06-24 MED ORDER — CETIRIZINE HCL 10 MG PO TABS: 10.0000 mg | ORAL_TABLET | Freq: Every day | ORAL | 2 refills | Status: DC

## 2021-06-24 MED ORDER — ATORVASTATIN CALCIUM 10 MG PO TABS: 10.0000 mg | ORAL_TABLET | Freq: Every day | ORAL | 1 refills | Status: DC

## 2021-06-24 MED ORDER — AZITHROMYCIN 250 MG PO TABS: ORAL_TABLET | ORAL | 0 refills | Status: AC

## 2021-06-24 NOTE — Progress Notes (Signed)
Date:  06/24/2021   Name:  Jill Guzman   DOB:  03/03/1941   MRN:  [id number]   Chief Complaint: Hypertension, Allergic Rhinitis , Hyperlipidemia, overactive bladder, and Sinusitis  Hypertension This is a chronic problem. The current episode started more than 1 year ago. The problem has been gradually improving since onset. The problem is controlled. Pertinent negatives include no chest pain, headaches, neck pain, orthopnea, palpitations, PND or shortness of breath. There are no associated agents to hypertension. Past treatments include calcium channel blockers. There is no history of angina, kidney disease, CAD/MI, CVA, heart failure, left ventricular hypertrophy, PVD or retinopathy. There is no history of chronic renal disease, a hypertension causing med or renovascular disease.  Hyperlipidemia This is a chronic problem. The current episode started more than 1 year ago. The problem is controlled. Recent lipid tests were reviewed and are normal. She has no history of chronic renal disease, diabetes, hypothyroidism, liver disease, obesity or nephrotic syndrome. Pertinent negatives include no chest pain, focal sensory loss, focal weakness, leg pain, myalgias or shortness of breath. Current antihyperlipidemic treatment includes statins. The current treatment provides moderate improvement of lipids. There are no compliance problems.  Risk factors for coronary artery disease include hypertension.  Sinusitis This is a chronic problem. The current episode started more than 1 month ago. The problem has been waxing and waning since onset. There has been no fever. Pertinent negatives include no chills, coughing, ear pain, headaches, neck pain, shortness of breath or sore throat. Treatments tried: zyrtec.  URI  This is a chronic problem. The current episode started more than 1 month ago. The problem has been waxing and waning. Pertinent negatives include no abdominal pain, chest pain, coughing, diarrhea,  dysuria, ear pain, headaches, nausea, neck pain, rash, sore throat or wheezing.   Lab Results  Component Value Date   CREATININE 0.7 06/13/2021   BUN 18 06/13/2021   NA 135 (A) 06/13/2021   NA 139 06/13/2021   K 4.8 06/13/2021   CL 104 12/31/2020   CO2 22 12/31/2020   Lab Results  Component Value Date   CHOL 175 06/13/2021   HDL 49 06/13/2021   LDLCALC 110 06/13/2021   TRIG 84 06/13/2021   CHOLHDL 2.9 03/29/2019   Lab Results  Component Value Date   TSH 1.69 06/13/2021   Lab Results  Component Value Date   HGBA1C 7.4 06/13/2021   Lab Results  Component Value Date   WBC 8.3 03/29/2019   HGB 14.1 03/29/2019   HCT 42.9 03/29/2019   MCV 85 03/29/2019   PLT 225 03/29/2019   Lab Results  Component Value Date   ALT 9 06/13/2021   AST 10 (A) 06/13/2021   ALKPHOS 51 12/31/2020   BILITOT 0.5 12/31/2020     Review of Systems  Constitutional:  Negative for chills and fever.  HENT:  Negative for drooling, ear discharge, ear pain and sore throat.   Respiratory:  Negative for cough, shortness of breath and wheezing.   Cardiovascular:  Negative for chest pain, palpitations, orthopnea, leg swelling and PND.  Gastrointestinal:  Negative for abdominal pain, blood in stool, constipation, diarrhea and nausea.  Endocrine: Negative for polydipsia.  Genitourinary:  Negative for dysuria, frequency, hematuria and urgency.  Musculoskeletal:  Negative for back pain, myalgias and neck pain.  Skin:  Negative for rash.  Allergic/Immunologic: Negative for environmental allergies.  Neurological:  Negative for dizziness, focal weakness and headaches.  Hematological:  Does not bruise/bleed easily.  Psychiatric/Behavioral:  Negative for suicidal ideas. The patient is not nervous/anxious.    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   Clinical depression 12/31/2014   AB (asthmatic bronchitis) 12/31/2014   Routine general medical examination at a health care facility 12/31/2014   Diabetes ([place]) 12/31/2014   BP (high blood pressure) 12/31/2014   Osteopenia 12/31/2014    Allergies  Allergen Reactions   Penicillins Itching   Sulfa Antibiotics Itching    Past Surgical History:  Procedure Laterality Date   ABDOMINAL HYSTERECTOMY     BREAST EXCISIONAL BIOPSY Left 1976   neg   CATARACT EXTRACTION W/ INTRAOCULAR LENS  IMPLANT, BILATERAL     COLONOSCOPY  2014   normal   cyst on bladder removed     cyst removed breast Left    KNEE ARTHROSCOPY WITH MEDIAL MENISECTOMY Left 10/20/2019   Procedure: KNEE ARTHROSCOPY WITH PARTIAL MEDIAL MENISECTOMY;  Surgeon: Leim Fabry, MD;  Location: [facility];  Service: Orthopedics;  Laterality: Left;  DIABETIC - oral meds   PARTIAL HYSTERECTOMY     TUBAL LIGATION      Social History   Tobacco Use   Smoking status: Former    Packs/day: 2.00    Years: 10.00    Pack years: 20.00    Types: Cigarettes    Quit date: 05/11/1999    Years since quitting: 22.1   Smokeless tobacco: Never   Tobacco comments:    Smoking cessation materials not required  Vaping Use   Vaping Use: Never used  Substance Use Topics   Alcohol use: No    Alcohol/week: 0.0 standard drinks   Drug use: No     Medication list has been reviewed and updated.  Current Meds  Medication Sig   albuterol (VENTOLIN HFA) 108 (90 Base) MCG/ACT inhaler Inhale 1 puff into the lungs QID.   amLODipine (NORVASC) 2.5 MG tablet Take 2.5 mg by mouth daily.   atorvastatin  (LIPITOR) 10 MG tablet Take 1 tablet (10 mg total) by mouth daily.   budesonide-formoterol (SYMBICORT) 80-4.5 MCG/ACT inhaler Inhale 2 puffs into the lungs 2 (two) times daily.   cetirizine (ZYRTEC) 10 MG tablet Take 1 tablet (10 mg total) by mouth daily.   Cholecalciferol (VITAMIN D) 50 MCG (2000 UT) CAPS Take by mouth.   glucose blood (ACCU-CHEK AVIVA PLUS) test strip USE ONE STRIP TO CHECK GLUCOSE ONCE DAILY   losartan (COZAAR) 100 MG tablet Take 1 tablet (100 mg total) by mouth daily.   metFORMIN (GLUCOPHAGE) 500 MG tablet Take 1 tablet (500 mg total) by mouth 2 (two) times daily. (Patient taking differently: Take 1,000 mg by mouth 2 (two) times daily with a meal.)   Omega 3 1000 MG CAPS Take 1 capsule (1,000 mg total) by mouth daily.   oxybutynin (DITROPAN) 5 MG tablet Take 1 tablet by mouth twice daily   spironolactone (ALDACTONE) 50 MG tablet Take 1 tablet by mouth daily.   [DISCONTINUED] metFORMIN (GLUCOPHAGE) 500 MG tablet Take by mouth.    PHQ 2/9 Scores 06/24/2021 02/05/2021 12/31/2020 11/22/2020  PHQ - 2 Score 4  0 0 0  PHQ- 9 Score 8 0 0 0    GAD 7 : Generalized Anxiety Score 06/24/2021 02/05/2021 12/31/2020 08/13/2020  Nervous, Anxious, on Edge 2 0 0 0  Control/stop worrying 2 0 0 0  Worry too much - different things 2 0 0 0  Trouble relaxing 0 0 0 0  Restless 0 0 0 0  Easily annoyed or irritable 2 0 0 0  Afraid - awful might happen 0 0 0 0  Total GAD 7 Score 8 0 0 0  Anxiety Difficulty Not difficult at all - - -    BP Readings from Last 3 Encounters:  06/24/21 120/80  05/13/21 (!) 162/82  02/05/21 120/80    Physical Exam  Wt Readings from Last 3 Encounters:  06/24/21 131 lb (59.4 kg)  05/13/21 130 lb 12.8 oz (59.3 kg)  02/05/21 132 lb (59.9 kg)    BP 120/80   Pulse 72   Ht 5\' 1"  (1.549 m)   Wt 131 lb (59.4 kg)   BMI 24.75 kg/m   Assessment and Plan: 1. Essential hypertension Chronic.  Controlled.  Stable.  According to the instructions that patient has from  endocrinology spironolactone has been increased to 50 mg losartan has been continued at current dosing and amlodipine is at 2.5 and not 5 mg daily.  We will continue this regimen whereas the blood pressure is doing well at 120/80 and will recheck patient in 6 months - amLODipine (NORVASC) 2.5 MG tablet; Take 1 tablet (2.5 mg total) by mouth daily.  Dispense: 90 tablet; Refill: 1  2. Familial multiple lipoprotein-type hyperlipidemia Chronic.  Controlled.  Stable.  Continue atorvastatin 10 mg once a day. - atorvastatin (LIPITOR) 10 MG tablet; Take 1 tablet (10 mg total) by mouth daily.  Dispense: 90 tablet; Refill: 1  3. 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  Persistent.  Episodic.  Associated with pollen.  Patient is convinced that the sinuses there is swelling and congestion in her maxillary may be periorbital sinuses.  We will maximize approach to sinus with continuance of Zyrtec and the addition of Flonase and Singulair.  We will recheck patient in 6 months.  In the meantime we will obtain an x-ray of the sinuses to see if there is any chronic sinusitis that is contributing to these concerns - cetirizine (ZYRTEC) 10 MG tablet; Take 1 tablet (10 mg total) by mouth daily.  Dispense: 30 tablet; Refill: 2 - DG Sinuses Complete; Future - montelukast (SINGULAIR) 10 MG tablet; Take 1 tablet (10 mg total) by mouth at bedtime.  Dispense: 30 tablet; Refill: 5 - fluticasone (FLONASE) 50 MCG/ACT nasal spray; Place 2 sprays into both nostrils daily.  Dispense: 16 g; Refill: 6  4. Overactive bladder Chronic.  Controlled.  Stable.  Continue Ditropan 5 mg twice a day. - oxybutynin (DITROPAN) 5 MG tablet; Take 1 tablet (5 mg total) by mouth 2 (two) times daily.  Dispense: 60 tablet; Refill: 2  5. Acute maxillary sinusitis, recurrence not specified In the event that there may be a current maxillary sinusitis we will cover with azithromycin  6. Dizziness Relatively new onset for 6 months.   Patient notes dizziness for but primarily when she stands up I suspect this is orthostatic in nature given the medication she is currently on for blood pressure.  However patient wants to rule out sinus issues first and we will do that in the meantime we have reemphasized that the current medical regimen for her blood  pressure is going to be losartan/spironolactone/amlodipine at 2.5 mg.  We have also maximize sinus decompression with addition of a nasal steroid and leukotrienes inhibitor.  Upon checking his sinus x-ray we will also determine if there is a chronic sinusitis or swelling of the nasal sinuses as well

## 2021-07-17 DIAGNOSIS — M810 Age-related osteoporosis without current pathological fracture: Secondary | ICD-10-CM | POA: Diagnosis not present

## 2021-07-27 ENCOUNTER — Other Ambulatory Visit: Payer: Self-pay | Admitting: Family Medicine

## 2021-07-27 DIAGNOSIS — I10 Essential (primary) hypertension: Secondary | ICD-10-CM

## 2021-07-27 NOTE — Telephone Encounter (Signed)
Requested Prescriptions  Pending Prescriptions Disp Refills  . losartan (COZAAR) 100 MG tablet [Pharmacy Med Name: Losartan Potassium 100 MG Oral Tablet] 90 tablet 1    Sig: Take 1 tablet by mouth once daily     Cardiovascular:  Angiotensin Receptor Blockers Passed - 07/27/2021 10:31 AM      Passed - Cr in normal range and within 180 days    Creatinine  Date Value Ref Range Status  06/13/2021 0.7 0.5 - 1.1 Final   Creatinine, Ser  Date Value Ref Range Status  12/31/2020 0.69 0.57 - 1.00 mg/dL Final         Passed - K in normal range and within 180 days    Potassium  Date Value Ref Range Status  06/13/2021 4.8 3.4 - 5.3 Final         Passed - Patient is not pregnant      Passed - Last BP in normal range    BP Readings from Last 1 Encounters:  06/24/21 120/80         Passed - Valid encounter within last 6 months    Recent Outpatient Visits          1 month ago Essential hypertension   [place], MD   5 months ago Chronic obstructive pulmonary disease, unspecified COPD type ([place])   [facility] Juline Patch, MD   6 months ago Chronic obstructive pulmonary disease, unspecified COPD type ([place])   [facility] Juline Patch, MD   6 months ago Essential hypertension   [place], MD   8 months ago Hip pain, acute, left   [facility] Juline Patch, MD      Future Appointments            In 4 months Juline Patch, MD [facility], [facility]

## 2021-08-25 ENCOUNTER — Other Ambulatory Visit: Payer: Self-pay | Admitting: Family Medicine

## 2021-08-25 DIAGNOSIS — E119 Type 2 diabetes mellitus without complications: Secondary | ICD-10-CM

## 2021-08-25 DIAGNOSIS — I10 Essential (primary) hypertension: Secondary | ICD-10-CM

## 2021-08-25 NOTE — Telephone Encounter (Signed)
Medication Refill - Medication: accu-chek guide me test strips  Has the patient contacted their pharmacy? Yes.     (Agent: If yes, when and what did the pharmacy advise?) Was told to call her PCP.   Preferred Pharmacy (with phone number or street name):  [place], [place] [facility] [address]  [place] [address]  Phone: [phone] Fax: [phone]  Hours: Not open 24 hours   Has the patient been seen for an appointment in the last year OR does the patient have an upcoming appointment? Yes.    Agent: Please be advised that RX refills may take up to 3 business days. We ask that you follow-up with your pharmacy.

## 2021-08-26 MED ORDER — ACCU-CHEK AVIVA PLUS VI STRP: ORAL_STRIP | 11 refills | Status: DC

## 2021-08-26 NOTE — Telephone Encounter (Signed)
Requested Prescriptions  Pending Prescriptions Disp Refills   glucose blood (ACCU-CHEK AVIVA PLUS) test strip 50 each 11    Sig: USE ONE STRIP TO CHECK GLUCOSE ONCE DAILY     Endocrinology: Diabetes - Testing Supplies Passed - 08/26/2021  2:39 PM      Passed - Valid encounter within last 12 months    Recent Outpatient Visits          2 months ago Essential hypertension   [place], MD   6 months ago Chronic obstructive pulmonary disease, unspecified COPD type ([place])   [facility] Juline Patch, MD   7 months ago Chronic obstructive pulmonary disease, unspecified COPD type ([place])   [facility] Juline Patch, MD   7 months ago Essential hypertension   [place], MD   9 months ago Hip pain, acute, left   [facility] Juline Patch, MD      Future Appointments            In 3 months Juline Patch, MD [facility], [facility]

## 2021-08-29 ENCOUNTER — Telehealth: Payer: Self-pay | Admitting: Family Medicine

## 2021-08-29 NOTE — Telephone Encounter (Signed)
Test strips was sent to pharmacy 08/26/2021.  KP

## 2021-08-29 NOTE — Telephone Encounter (Signed)
Medication Refill - Medication:  glucose blood (ACCU-CHEK GUIDE) test strip  Has the patient contacted their pharmacy? Yes.   Pharmacy contacted on behalf of pt  Preferred Pharmacy (with phone number or street name):  [place], [address] Phone:  [phone]  Fax:  [phone]      Has the patient been seen for an appointment in the last year OR does the patient have an upcoming appointment? Yes.    Agent: Please be advised that RX refills may take up to 3 business days. We ask that you follow-up with your pharmacy.

## 2021-09-02 NOTE — Telephone Encounter (Signed)
Pt called reporting that the wrong test strips have been sent in. She does not need the aviva, she needs the Accu-Chek Guide test strips sent to [facility] on [address].

## 2021-09-04 ENCOUNTER — Other Ambulatory Visit: Payer: Self-pay | Admitting: Family Medicine

## 2021-09-04 DIAGNOSIS — E119 Type 2 diabetes mellitus without complications: Secondary | ICD-10-CM

## 2021-09-09 ENCOUNTER — Other Ambulatory Visit: Payer: Self-pay

## 2021-09-09 DIAGNOSIS — E119 Type 2 diabetes mellitus without complications: Secondary | ICD-10-CM

## 2021-09-09 MED ORDER — ACCU-CHEK GUIDE ME W/DEVICE KIT: PACK | 0 refills | Status: AC

## 2021-10-09 ENCOUNTER — Other Ambulatory Visit: Payer: Self-pay

## 2021-10-09 ENCOUNTER — Ambulatory Visit
Admission: RE | Admit: 2021-10-09 | Discharge: 2021-10-09 | Disposition: A | Discharge status: Home / Self Care | Payer: Medicare Other | Attending: Family Medicine | Admitting: Family Medicine

## 2021-10-09 ENCOUNTER — Ambulatory Visit
Admission: RE | Admit: 2021-10-09 | Discharge: 2021-10-09 | Disposition: A | Discharge status: Home / Self Care | Payer: Medicare Other | Source: Ambulatory Visit | Attending: Family Medicine | Admitting: Family Medicine

## 2021-10-09 ENCOUNTER — Ambulatory Visit (INDEPENDENT_AMBULATORY_CARE_PROVIDER_SITE_OTHER): Payer: Medicare Other | Admitting: Family Medicine

## 2021-10-09 ENCOUNTER — Encounter: Payer: Self-pay | Admitting: Family Medicine

## 2021-10-09 VITALS — BP 130/72 | HR 76 | Ht 61.0 in | Wt 130.0 lb

## 2021-10-09 DIAGNOSIS — M5432 Sciatica, left side: Secondary | ICD-10-CM

## 2021-10-09 DIAGNOSIS — R102 Pelvic and perineal pain: Secondary | ICD-10-CM

## 2021-10-09 DIAGNOSIS — M25552 Pain in left hip: Secondary | ICD-10-CM | POA: Diagnosis not present

## 2021-10-09 DIAGNOSIS — I7143 Infrarenal abdominal aortic aneurysm, without rupture: Secondary | ICD-10-CM

## 2021-10-09 DIAGNOSIS — M545 Low back pain, unspecified: Secondary | ICD-10-CM | POA: Diagnosis not present

## 2021-10-09 MED ORDER — PREDNISONE 10 MG PO TABS: ORAL_TABLET | ORAL | 1 refills | Status: DC

## 2021-10-09 NOTE — Progress Notes (Signed)
Date:  10/09/2021   Name:  Jill Guzman   DOB:  11-06-1940   MRN:  [id number]   Chief Complaint: No chief complaint on file.  Hip Pain  The incident occurred 5 to 7 days ago (4 days). The incident occurred in the yard. There was no injury mechanism (carrying bags of trash). The quality of the pain is described as shooting and aching. The pain is at a severity of 0/10. The patient is experiencing no pain. Associated symptoms include an inability to bear weight. Pertinent negatives include no loss of motion, loss of sensation, muscle weakness, numbness or tingling. She reports no foreign bodies present. The symptoms are aggravated by movement. She has tried acetaminophen, heat and ice for the symptoms. The treatment provided mild relief.   Lab Results  Component Value Date   NA 135 (A) 06/13/2021   NA 139 06/13/2021   K 4.8 06/13/2021   CO2 22 12/31/2020   GLUCOSE 126 (H) 12/31/2020   BUN 18 06/13/2021   CREATININE 0.7 06/13/2021   CALCIUM 10.8 (H) 12/31/2020   EGFR 89 12/31/2020   GFRNONAA >60 07/31/2020   Lab Results  Component Value Date   CHOL 175 06/13/2021   HDL 49 06/13/2021   LDLCALC 110 06/13/2021   TRIG 84 06/13/2021   CHOLHDL 2.9 03/29/2019   Lab Results  Component Value Date   TSH 1.69 06/13/2021   Lab Results  Component Value Date   HGBA1C 7.4 06/13/2021   Lab Results  Component Value Date   WBC 8.3 03/29/2019   HGB 14.1 03/29/2019   HCT 42.9 03/29/2019   MCV 85 03/29/2019   PLT 225 03/29/2019   Lab Results  Component Value Date   ALT 9 06/13/2021   AST 10 (A) 06/13/2021   ALKPHOS 51 12/31/2020   BILITOT 0.5 12/31/2020   No results found for: 25OHVITD2, 25OHVITD3, VD25OH   Review of Systems  Neurological:  Negative for tingling and numbness.   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   Clinical depression 12/31/2014   AB (asthmatic bronchitis) 12/31/2014   Routine general medical examination at a health care facility 12/31/2014   Diabetes ([place]) 12/31/2014   BP (high blood pressure) 12/31/2014   Osteopenia 12/31/2014    Allergies  Allergen Reactions   Penicillins Itching   Sulfa Antibiotics Itching    Past Surgical History:  Procedure Laterality Date   ABDOMINAL HYSTERECTOMY     BREAST EXCISIONAL BIOPSY Left 1976   neg   CATARACT EXTRACTION W/ INTRAOCULAR LENS  IMPLANT, BILATERAL     COLONOSCOPY  2014   normal   cyst on bladder removed     cyst removed breast Left    KNEE ARTHROSCOPY WITH MEDIAL MENISECTOMY Left 10/20/2019   Procedure: KNEE ARTHROSCOPY WITH PARTIAL MEDIAL MENISECTOMY;  Surgeon: Leim Fabry, MD;  Location: [facility];  Service: Orthopedics;  Laterality: Left;  DIABETIC - oral meds   PARTIAL HYSTERECTOMY     TUBAL LIGATION      Social History   Tobacco Use   Smoking status: Former  Packs/day: 2.00    Years: 10.00    Pack years: 20.00    Types: Cigarettes    Quit date: 05/11/1999    Years since quitting: 22.4   Smokeless tobacco: Never   Tobacco comments:    Smoking cessation materials not required  Vaping Use   Vaping Use: Never used  Substance Use Topics   Alcohol use: No    Alcohol/week: 0.0 standard drinks   Drug use: No     Medication list has been reviewed and updated.  No outpatient medications have been marked as taking for the 10/09/21 encounter (Office Visit) with Juline Patch, MD.    [facility] 2/9 Scores 06/24/2021 02/05/2021 12/31/2020 11/22/2020  PHQ - 2 Score 4  0 0 0  PHQ- 9 Score 8 0 0 0    GAD 7 : Generalized Anxiety Score 06/24/2021 02/05/2021 12/31/2020 08/13/2020  Nervous, Anxious, on Edge 2 0 0 0  Control/stop worrying 2 0 0 0  Worry too much - different things 2 0 0 0  Trouble relaxing 0 0 0 0  Restless 0 0 0 0  Easily annoyed or irritable 2 0 0 0  Afraid - awful might happen 0 0 0 0  Total GAD 7 Score 8 0 0 0  Anxiety Difficulty Not difficult at all - - -    BP Readings from Last 3 Encounters:  06/24/21 120/80  05/13/21 (!) 162/82  02/05/21 120/80    Physical Exam  Wt Readings from Last 3 Encounters:  06/24/21 131 lb (59.4 kg)  05/13/21 130 lb 12.8 oz (59.3 kg)  02/05/21 132 lb (59.9 kg)    Ht '5\' 1"'  (1.549 m)    BMI 24.75 kg/m   Assessment and Plan:  1. Acute pain in female pelvis New onset patient has had 72 hours of excruciating pain involved left pelvis and associated sciatic nerve area.  Patient has done extensive yard work again along with painting of her house again.  We have obtained a hip x-ray of the pelvis as well as lumbar spine found.  And will reinitiate prednisone taper starting at 60 mg.  Over 2 weeks. - DG Hip Unilat W OR W/O Pelvis 2-3 Views Left; Future - DG Lumbar Spine Complete; Future - predniSONE (DELTASONE) 10 MG tablet; Taper 6,6,6,5,5,5,4,4,3,3,2,2,1,1  Dispense: 53 tablet; Refill: 1  2. Sciatica of left side Some question that patient although does not have true back pain but the pain is in the sciatic nerve distribution.  There is some mild paraspinal spasm neurologic exam is normal and straight leg raises are negative bilateral.  We will obtain an LS spine to rule out compression fracture. - DG Hip Unilat W OR W/O Pelvis 2-3 Views Left; Future - DG Lumbar Spine Complete; Future - predniSONE (DELTASONE) 10 MG tablet; Taper 6,6,6,5,5,5,4,4,3,3,2,2,1,1  Dispense: 53 tablet; Refill: 1  3. Infrarenal abdominal aortic aneurysm (AAA) without rupture Patient has an abdominal aortic aneurysm which is  followed by Dr. Lucky Cowboy at [facility].  There is noted to be an evaluation on 9/22 however it is mentioned in the note that there is to be a repeat of a duplex ultrasound in 6 months and this will be coming up in March.  We are calling [facility] to reaffirm this timetable.

## 2021-10-10 ENCOUNTER — Telehealth: Payer: Self-pay | Admitting: Family Medicine

## 2021-10-10 NOTE — Telephone Encounter (Signed)
Copied from [place] [phone]. Topic: General - Other >> Oct 10, 2021  2:46 PM Valere Dross wrote: Reason for CRM: Pt called in stating she was returning a call about her imaging results, please advise.

## 2021-10-13 ENCOUNTER — Telehealth: Payer: Self-pay | Admitting: Family Medicine

## 2021-10-13 ENCOUNTER — Telehealth: Payer: Self-pay | Admitting: Internal Medicine

## 2021-10-13 ENCOUNTER — Telehealth: Payer: Self-pay

## 2021-10-13 NOTE — Telephone Encounter (Signed)
Pt called in to say she has appt with Dr Lucky Cowboy scheduled for 10/14/21 for AAA eval

## 2021-10-13 NOTE — Telephone Encounter (Signed)
Received call from on call triage nurse 2/4 at 1234 am. Critical alert for CT results.  IMPRESSION: No acute fracture or listhesis of the lumbar spine.   Suspected minimum 5.4 cm saccular aneurysm of the infrarenal abdominal aorta, likely enlarged since prior CT arteriogram of 07/31/2020. Given the patient's history of left pelvic and left hip pain, repeat CT arteriography is recommended for further evaluation.  IMPRESSION: Mild bilateral degenerative hip arthritis.  Known aneurysm, increased in size from 4.5 cm 07/2020 to 5.4 cm on current CT. Pt follows with vascular surgery. Sent pt message advising her to go ahead and follow up with vascular prior to 05/2022 appt.

## 2021-10-13 NOTE — Telephone Encounter (Signed)
Copied from [place] [phone]. Topic: General - Other >> Oct 13, 2021  1:29 PM Leward Quan A wrote: Reason for CRM: Patient called in to inform Baxter Flattery that she have an appointment with Dr Lucky Cowboy on 10/14/21. Just to inform

## 2021-10-14 ENCOUNTER — Other Ambulatory Visit: Payer: Self-pay

## 2021-10-14 ENCOUNTER — Encounter (INDEPENDENT_AMBULATORY_CARE_PROVIDER_SITE_OTHER): Payer: Self-pay | Admitting: Vascular Surgery

## 2021-10-14 ENCOUNTER — Telehealth (INDEPENDENT_AMBULATORY_CARE_PROVIDER_SITE_OTHER): Payer: Self-pay | Admitting: *Deleted

## 2021-10-14 ENCOUNTER — Ambulatory Visit (INDEPENDENT_AMBULATORY_CARE_PROVIDER_SITE_OTHER): Payer: Medicare Other | Admitting: Vascular Surgery

## 2021-10-14 VITALS — BP 195/85 | HR 85 | Resp 16 | Wt 128.8 lb

## 2021-10-14 DIAGNOSIS — I7143 Infrarenal abdominal aortic aneurysm, without rupture: Secondary | ICD-10-CM

## 2021-10-14 DIAGNOSIS — I10 Essential (primary) hypertension: Secondary | ICD-10-CM

## 2021-10-14 DIAGNOSIS — I7133 Infrarenal abdominal aortic aneurysm, ruptured: Secondary | ICD-10-CM

## 2021-10-14 DIAGNOSIS — E119 Type 2 diabetes mellitus without complications: Secondary | ICD-10-CM | POA: Diagnosis not present

## 2021-10-14 NOTE — Telephone Encounter (Signed)
Called the patient insurance to get Prior Auth for CTA Abdomen [phone]) and CTA [phone]) and they do not require a prior auth. They gave me the last 4 of the auth #[id number] as a reference for call back.

## 2021-10-14 NOTE — Assessment & Plan Note (Signed)
Although plain x-ray is not particularly accurate in measuring the exact diameter of the aneurysm, there is concern for significant growth so an ASAP CT scan should be done and we will order that today.  We can review the scan and determine whether or not it has grown and would need to be fixed at this time, or short, close follow-up is still appropriate.

## 2021-10-14 NOTE — Progress Notes (Signed)
MRN : [id number]  Jill Guzman is a 81 y.o. (05-Mar-1941) female who presents with chief complaint of  Chief Complaint  Patient presents with   Follow-up    Ct results  .  History of Present Illness: Patient returns today in follow up of her aneurysm.  We checked this about 5 months ago and it was found to be 4.5 cm in maximal diameter.  She had plain back x-rays which I have independently reviewed in the last few days which suggested a marked increase in the size of the aneurysm now up to 5.4 cm.  This was seen in an oblique angle and really is not an accurate assessment of the size, but any increase in size is possible and concerning.  No new symptoms.  She has chronic low back pain.  No signs of peripheral embolization.  Current Outpatient Medications  Medication Sig Dispense Refill   Acetaminophen (TYLENOL PO) Take by mouth as needed.     albuterol (VENTOLIN HFA) 108 (90 Base) MCG/ACT inhaler Inhale 1 puff into the lungs QID. 6.7 g 11   amLODipine (NORVASC) 2.5 MG tablet Take 1 tablet (2.5 mg total) by mouth daily. 90 tablet 1   atorvastatin (LIPITOR) 10 MG tablet Take 1 tablet (10 mg total) by mouth daily. 90 tablet 1   Blood Glucose Monitoring Suppl (ACCU-CHEK GUIDE ME) w/Device KIT Test once daily 1 kit 0   budesonide-formoterol (SYMBICORT) 80-4.5 MCG/ACT inhaler Inhale 2 puffs into the lungs 2 (two) times daily. 1 each 12   cetirizine (ZYRTEC) 10 MG tablet Take 1 tablet (10 mg total) by mouth daily. 30 tablet 2   Cholecalciferol (VITAMIN D) 50 MCG (2000 UT) CAPS Take by mouth.     fluticasone (FLONASE) 50 MCG/ACT nasal spray Place 2 sprays into both nostrils daily. 16 g 6   glucose blood (ACCU-CHEK AVIVA PLUS) test strip USE 1 STRIP TO CHECK GLUCOSE ONCE DAILY 50 each 11   Ibuprofen (ADVIL PO) Take by mouth as needed.     losartan (COZAAR) 100 MG tablet Take 1 tablet by mouth once daily 90 tablet 1   metFORMIN (GLUCOPHAGE) 500 MG tablet Take 1 tablet (500 mg total) by mouth 2  (two) times daily. (Patient taking differently: Take 1,000 mg by mouth 2 (two) times daily with a meal.) 180 tablet 6   montelukast (SINGULAIR) 10 MG tablet Take 1 tablet (10 mg total) by mouth at bedtime. 30 tablet 5   Omega 3 1000 MG CAPS Take 1 capsule (1,000 mg total) by mouth daily.     oxybutynin (DITROPAN) 5 MG tablet Take 1 tablet (5 mg total) by mouth 2 (two) times daily. 60 tablet 2   predniSONE (DELTASONE) 10 MG tablet Taper 6,6,6,5,5,5,4,4,3,3,2,2,1,1 53 tablet 1   spironolactone (ALDACTONE) 50 MG tablet Take 1 tablet by mouth daily.     No current facility-administered medications for this visit.    Past Medical History:  Diagnosis Date   Anxiety    Aortic aneurysm ([place])    Asthma    Bell's palsy    age 39 and age 27    COPD (chronic obstructive pulmonary disease) ([place])    Deaf, right    Depression    Diabetes mellitus without complication ([place])    Hearing aid worn    left   Hyperlipidemia    Hypertension    Shingles 08/13/2020   Varicose veins of legs    Vertigo    random, approx 1x/month   Wears  dentures    full upper and lower    Past Surgical History:  Procedure Laterality Date   ABDOMINAL HYSTERECTOMY     BREAST EXCISIONAL BIOPSY Left 1976   neg   CATARACT EXTRACTION W/ INTRAOCULAR LENS  IMPLANT, BILATERAL     COLONOSCOPY  2014   normal   cyst on bladder removed     cyst removed breast Left    KNEE ARTHROSCOPY WITH MEDIAL MENISECTOMY Left 10/20/2019   Procedure: KNEE ARTHROSCOPY WITH PARTIAL MEDIAL MENISECTOMY;  Surgeon: Leim Fabry, MD;  Location: [place] [facility];  Service: Orthopedics;  Laterality: Left;  DIABETIC - oral meds   PARTIAL HYSTERECTOMY     TUBAL LIGATION       Social History   Tobacco Use   Smoking status: Former    Packs/day: 2.00    Years: 10.00    Pack years: 20.00    Types: Cigarettes    Quit date: 05/11/1999    Years since quitting: 22.4   Smokeless tobacco: Never   Tobacco comments:    Smoking cessation materials  not required  Vaping Use   Vaping Use: Never used  Substance Use Topics   Alcohol use: No    Alcohol/week: 0.0 standard drinks   Drug use: No       Family History  Problem Relation Age of Onset   Diabetes Mother    Stroke Mother    Healthy Daughter    Cancer Son 16       lung   Hypertension Son    Rheum arthritis Daughter    Hypertension Daughter    Arthritis Daughter    COPD Son    Breast cancer Neg Hx      Allergies  Allergen Reactions   Penicillins Itching   Sulfa Antibiotics Itching    REVIEW OF SYSTEMS (Negative unless checked)   Constitutional: _0 Weight loss  _1 Fever  _2 Chills Cardiac: _3 Chest pain   _4 Chest pressure   _5 Palpitations   _6 Shortness of breath when laying flat   _7 Shortness of breath at rest   _8 Shortness of breath with exertion. Vascular:  _9 Pain in legs with walking   _10 Pain in legs at rest   _11 Pain in legs when laying flat   _12 Claudication   _13 Pain in feet when walking  _14 Pain in feet at rest  _15 Pain in feet when laying flat   _16 History of DVT   _17 Phlebitis   _18 Swelling in legs   _19 Varicose veins   _20 Non-healing ulcers Pulmonary:   _21 Uses home oxygen   _22 Productive cough   _23 Hemoptysis   _24 Wheeze  _25 COPD   _26 Asthma Neurologic:  _27 Dizziness  _28 Blackouts   _29 Seizures   _30 History of stroke   _31 History of TIA  _32 Aphasia   _33 Temporary blindness   _34 Dysphagia   _35 Weakness or numbness in arms   _36 Weakness or numbness in legs Musculoskeletal:  _37 Arthritis   _38 Joint swelling   _39 Joint pain   _40 Low back pain Hematologic:  _41 Easy bruising  _42 Easy bleeding   _43 Hypercoagulable state   _44 Anemic  _45 Hepatitis Gastrointestinal:  _46 Blood in stool   _47 Vomiting blood  _48 Gastroesophageal reflux/heartburn   _49 Abdominal pain Genitourinary:  _50 Chronic kidney disease   _51 Difficult urination  _52 Frequent urination  _53 Burning with urination   _54 Hematuria Skin:  _55 Rashes   _56 Ulcers   _57 Wounds Psychological:  _58 History of anxiety   _59  History of major  depression.   Physical Examination  BP (!) 195/85 (BP Location: Left Arm)    Pulse 85    Resp 16  Wt 128 lb 12.8 oz (58.4 kg)    BMI 24.34 kg/m  Gen:  WD/WN, NAD. Appears younger than stated age. Head: Gay/AT, No temporalis wasting. Ear/Nose/Throat: Hearing grossly intact, nares w/o erythema or drainage Eyes: Conjunctiva clear. Sclera non-icteric Neck: Supple.  Trachea midline Pulmonary:  Good air movement, no use of accessory muscles.  Cardiac: RRR, no JVD Vascular:  Vessel Right Left  Radial Palpable Palpable                          PT Palpable Palpable  DP Palpable Palpable   Gastrointestinal: soft, non-tender/non-distended. No guarding/reflex. Increased aortic impulse Musculoskeletal: M/S 5/5 throughout.  No deformity or atrophy. No edema. Neurologic: Sensation grossly intact in extremities.  Symmetrical.  Speech is fluent.  Psychiatric: Judgment intact, Mood & affect appropriate for pt's clinical situation. Dermatologic: No rashes or ulcers noted.  No cellulitis or open wounds.      Labs No results found for this or any previous visit (from the past 2160 hour(s)).  Radiology DG Lumbar Spine Complete  Result Date: 10/11/2021 CLINICAL DATA:  Left buttock pain EXAM: LUMBAR SPINE - COMPLETE 4+ VIEW COMPARISON:  None. FINDINGS: Normal lumbar lordosis. No acute fracture or listhesis of the lumbar spine. Vertebral body height has been preserved. Intervertebral disc height is preserved. Mild endplate remodeling is seen throughout the lumbar spine in keeping with mild degenerative disc disease. 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 IMPRESSION: No acute fracture or listhesis of the lumbar spine. 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 Given the patient's history of left pelvic and left hip pain, repeat CT arteriography is recommended for further evaluation. These results will be called to the ordering clinician or representative by the Radiologist Assistant, and communication documented in the PACS or [employer]. Electronically Signed   By: Fidela Salisbury M.D.   On: 10/11/2021 23:29   DG Hip Unilat W OR W/O Pelvis 2-3 Views Left  Result Date: 10/11/2021 CLINICAL DATA:  Pelvic and left hip pain EXAM: DG HIP (WITH OR WITHOUT PELVIS) 2-3V LEFT COMPARISON:  09/01/2018 FINDINGS: Normal alignment. No acute fracture or dislocation. Mild bilateral degenerative hip arthritis. Sacroiliac joint spaces are preserved. Vascular calcifications are seen within the abdominal aorta IMPRESSION: Mild bilateral degenerative hip arthritis. Electronically Signed   By: Fidela Salisbury M.D.   On: 10/11/2021 23:25    Assessment/Plan Diabetes blood glucose control important in reducing the progression of atherosclerotic disease. Also, involved in wound healing. On appropriate medications.     BP (high blood pressure) blood pressure control important in reducing the progression of atherosclerotic disease and aneurysmal growth. On appropriate oral medications.  AAA (abdominal aortic aneurysm) without rupture (HCC) Although plain x-ray is not particularly accurate in measuring the exact diameter of the aneurysm, there is concern for significant growth so an ASAP CT scan should be done and we will order that today.  We can review the scan and determine whether or not it has grown and would need to be fixed at this time, or short, close follow-up is still appropriate.    Leotis Pain, MD  10/14/2021 12:18 PM    This note was created with Dragon medical transcription system.  Any errors from dictation are purely unintentional

## 2021-10-20 ENCOUNTER — Ambulatory Visit (INDEPENDENT_AMBULATORY_CARE_PROVIDER_SITE_OTHER): Payer: Medicare Other

## 2021-10-20 DIAGNOSIS — Z0000 Encounter for general adult medical examination without abnormal findings: Secondary | ICD-10-CM | POA: Diagnosis not present
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
## 2021-10-20 NOTE — Patient Instructions (Signed)
Jill Guzman , Thank you for taking time to come for your Medicare Wellness Visit. I appreciate your ongoing commitment to your health goals. Please review the following plan we discussed and let me know if I can assist you in the future.   Screening recommendations/referrals: Colonoscopy: no longer required Mammogram: no longer required  Bone Density: done 11/21/19 Recommended yearly ophthalmology/optometry visit for glaucoma screening and checkup Recommended yearly dental visit for hygiene and checkup  Vaccinations: Influenza vaccine: done 07/03/21 Pneumococcal vaccine: done 06/22/19 Tdap vaccine: done 09/20/18 Shingles vaccine: done 06/22/19 & 03/17/21   Covid-19:done 09/13/19, 10/04/19 & 06/14/20  Advanced directives: Please bring a copy of your health care power of attorney and living will to the office at your convenience.   Conditions/risks identified: Keep up the great work!  Next appointment: Follow up in one year for your annual wellness visit    Preventive Care 65 Years and Older, Female Preventive care refers to lifestyle choices and visits with your health care provider that can promote health and wellness. What does preventive care include? A yearly physical exam. This is also called an annual well check. Dental exams once or twice a year. Routine eye exams. Ask your health care provider how often you should have your eyes checked. Personal lifestyle choices, including: Daily care of your teeth and gums. Regular physical activity. Eating a healthy diet. Avoiding tobacco and drug use. Limiting alcohol use. Practicing safe sex. Taking low-dose aspirin every day. Taking vitamin and mineral supplements as recommended by your health care provider. What happens during an annual well check? The services and screenings done by your health care provider during your annual well check will depend on your age, overall health, lifestyle risk factors, and family history of  disease. Counseling  Your health care provider may ask you questions about your: Alcohol use. Tobacco use. Drug use. Emotional well-being. Home and relationship well-being. Sexual activity. Eating habits. History of falls. Memory and ability to understand (cognition). Work and work Statistician. Reproductive health. Screening  You may have the following tests or measurements: Height, weight, and BMI. Blood pressure. Lipid and cholesterol levels. These may be checked every 5 years, or more frequently if you are over 68 years old. Skin check. Lung cancer screening. You may have this screening every year starting at age 74 if you have a 30-pack-year history of smoking and currently smoke or have quit within the past 15 years. Fecal occult blood test (FOBT) of the stool. You may have this test every year starting at age 19. Flexible sigmoidoscopy or colonoscopy. You may have a sigmoidoscopy every 5 years or a colonoscopy every 10 years starting at age 50. Hepatitis C blood test. Hepatitis B blood test. Sexually transmitted disease (STD) testing. Diabetes screening. This is done by checking your blood sugar (glucose) after you have not eaten for a while (fasting). You may have this done every 1-3 years. Bone density scan. This is done to screen for osteoporosis. You may have this done starting at age 25. Mammogram. This may be done every 1-2 years. Talk to your health care provider about how often you should have regular mammograms. Talk with your health care provider about your test results, treatment options, and if necessary, the need for more tests. Vaccines  Your health care provider may recommend certain vaccines, such as: Influenza vaccine. This is recommended every year. Tetanus, diphtheria, and acellular pertussis (Tdap, Td) vaccine. You may need a Td booster every 10 years. Zoster vaccine. You may  need this after age 77. Pneumococcal 13-valent conjugate (PCV13) vaccine. One  dose is recommended after age 29. Pneumococcal polysaccharide (PPSV23) vaccine. One dose is recommended after age 81. Talk to your health care provider about which screenings and vaccines you need and how often you need them. This information is not intended to replace advice given to you by your health care provider. Make sure you discuss any questions you have with your health care provider. Document Released: 09/20/2015 Document Revised: 05/13/2016 Document Reviewed: 06/25/2015 Elsevier Interactive Patient Education  2017 [place] Prevention in the Home Falls can cause injuries. They can happen to people of all ages. There are many things you can do to make your home safe and to help prevent falls. What can I do on the outside of my home? Regularly fix the edges of walkways and driveways and fix any cracks. Remove anything that might make you trip as you walk through a door, such as a raised step or threshold. Trim any bushes or trees on the path to your home. Use bright outdoor lighting. Clear any walking paths of anything that might make someone trip, such as rocks or tools. Regularly check to see if handrails are loose or broken. Make sure that both sides of any steps have handrails. Any raised decks and porches should have guardrails on the edges. Have any leaves, snow, or ice cleared regularly. Use sand or salt on walking paths during winter. Clean up any spills in your garage right away. This includes oil or grease spills. What can I do in the bathroom? Use night lights. Install grab bars by the toilet and in the tub and shower. Do not use towel bars as grab bars. Use non-skid mats or decals in the tub or shower. If you need to sit down in the shower, use a plastic, non-slip stool. Keep the floor dry. Clean up any water that spills on the floor as soon as it happens. Remove soap buildup in the tub or shower regularly. Attach bath mats securely with double-sided  non-slip rug tape. Do not have throw rugs and other things on the floor that can make you trip. What can I do in the bedroom? Use night lights. Make sure that you have a light by your bed that is easy to reach. Do not use any sheets or blankets that are too big for your bed. They should not hang down onto the floor. Have a firm chair that has side arms. You can use this for support while you get dressed. Do not have throw rugs and other things on the floor that can make you trip. What can I do in the kitchen? Clean up any spills right away. Avoid walking on wet floors. Keep items that you use a lot in easy-to-reach places. If you need to reach something above you, use a strong step stool that has a grab bar. Keep electrical cords out of the way. Do not use floor polish or wax that makes floors slippery. If you must use wax, use non-skid floor wax. Do not have throw rugs and other things on the floor that can make you trip. What can I do with my stairs? Do not leave any items on the stairs. Make sure that there are handrails on both sides of the stairs and use them. Fix handrails that are broken or loose. Make sure that handrails are as long as the stairways. Check any carpeting to make sure that it is firmly attached  to the stairs. Fix any carpet that is loose or worn. Avoid having throw rugs at the top or bottom of the stairs. If you do have throw rugs, attach them to the floor with carpet tape. Make sure that you have a light switch at the top of the stairs and the bottom of the stairs. If you do not have them, ask someone to add them for you. What else can I do to help prevent falls? Wear shoes that: Do not have high heels. Have rubber bottoms. Are comfortable and fit you well. Are closed at the toe. Do not wear sandals. If you use a stepladder: Make sure that it is fully opened. Do not climb a closed stepladder. Make sure that both sides of the stepladder are locked into place. Ask  someone to hold it for you, if possible. Clearly mark and make sure that you can see: Any grab bars or handrails. First and last steps. Where the edge of each step is. Use tools that help you move around (mobility aids) if they are needed. These include: Canes. Walkers. Scooters. Crutches. Turn on the lights when you go into a dark area. Replace any light bulbs as soon as they burn out. Set up your furniture so you have a clear path. Avoid moving your furniture around. If any of your floors are uneven, fix them. If there are any pets around you, be aware of where they are. Review your medicines with your doctor. Some medicines can make you feel dizzy. This can increase your chance of falling. Ask your doctor what other things that you can do to help prevent falls. This information is not intended to replace advice given to you by your health care provider. Make sure you discuss any questions you have with your health care provider. Document Released: 06/20/2009 Document Revised: 01/30/2016 Document Reviewed: 09/28/2014 Elsevier Interactive Patient Education  2017 [employer].

## 2021-10-21 ENCOUNTER — Ambulatory Visit: Admission: RE | Admit: 2021-10-21 | Payer: Medicare Other | Source: Ambulatory Visit

## 2021-10-21 ENCOUNTER — Ambulatory Visit
Admission: RE | Admit: 2021-10-21 | Discharge: 2021-10-21 | Disposition: A | Discharge status: Home / Self Care | Payer: Medicare Other | Source: Ambulatory Visit | Attending: Vascular Surgery | Admitting: Vascular Surgery

## 2021-10-21 ENCOUNTER — Other Ambulatory Visit: Payer: Self-pay

## 2021-10-21 DIAGNOSIS — I70203 Unspecified atherosclerosis of native arteries of extremities, bilateral legs: Secondary | ICD-10-CM | POA: Diagnosis not present

## 2021-10-21 DIAGNOSIS — I7133 Infrarenal abdominal aortic aneurysm, ruptured: Secondary | ICD-10-CM | POA: Insufficient documentation

## 2021-10-21 DIAGNOSIS — I714 Abdominal aortic aneurysm, without rupture, unspecified: Secondary | ICD-10-CM | POA: Diagnosis not present

## 2021-10-21 LAB — POCT I-STAT CREATININE: Creatinine, Ser: 0.8 mg/dL (ref 0.44–1.00)

## 2021-10-21 MED ORDER — IOHEXOL 350 MG/ML SOLN
85.0000 mL | Freq: Once | INTRAVENOUS | Status: AC | PRN
  Administered 2021-10-21: 85 mL via INTRAVENOUS

## 2021-10-22 ENCOUNTER — Telehealth: Payer: Self-pay

## 2021-10-22 DIAGNOSIS — M5432 Sciatica, left side: Secondary | ICD-10-CM

## 2021-10-22 MED ORDER — MELOXICAM 7.5 MG PO TABS: 7.5000 mg | ORAL_TABLET | Freq: Every day | ORAL | 0 refills | Status: DC

## 2021-10-22 NOTE — Telephone Encounter (Signed)
Copied from [place] [id number]. Topic: General - Other >> Oct 22, 2021  9:02 AM Jill Guzman wrote: Reason for CRM: Pt called in to let PCP know that she has finished her Prednisone, and was advise to let someone know she can get started on a new medication, I did not understand what the medication she was saying, so someone may need to reach out to get that, please advise.

## 2021-10-22 NOTE — Telephone Encounter (Signed)
Called pt- sent in meloxicam for hip pain and back pain. She wants to hold on ortho for now

## 2021-10-28 ENCOUNTER — Other Ambulatory Visit: Payer: Self-pay

## 2021-10-28 ENCOUNTER — Ambulatory Visit (INDEPENDENT_AMBULATORY_CARE_PROVIDER_SITE_OTHER): Payer: Medicare Other | Admitting: Vascular Surgery

## 2021-10-28 VITALS — BP 167/84 | HR 82 | Ht 62.0 in | Wt 127.0 lb

## 2021-10-28 DIAGNOSIS — I7143 Infrarenal abdominal aortic aneurysm, without rupture: Secondary | ICD-10-CM | POA: Diagnosis not present

## 2021-10-28 DIAGNOSIS — E119 Type 2 diabetes mellitus without complications: Secondary | ICD-10-CM

## 2021-10-28 DIAGNOSIS — I10 Essential (primary) hypertension: Secondary | ICD-10-CM

## 2021-10-28 NOTE — Progress Notes (Signed)
MRN : [id number]  Jill Guzman is a 81 y.o. ([birth year]) female who presents with chief complaint of  Chief Complaint  Patient presents with   Follow-up    CT follow up  .  History of Present Illness: Patient returns today in follow up of her abdominal aortic aneurysm.  She has undergone a CT angiogram of the abdomen pelvis since her last visit which I have independently reviewed.  This demonstrates an approximately 4.7 to 4.8 cm distal infrarenal abdominal aortic aneurysm.  This has grown slightly from her previous size of 4.5 cm but not as large as her plain x-ray had suggested recently.  No aneurysm related symptoms.  She has chronic low back pain which is unchanged.  Current Outpatient Medications  Medication Sig Dispense Refill   Acetaminophen (TYLENOL PO) Take by mouth as needed.     albuterol (VENTOLIN HFA) 108 (90 Base) MCG/ACT inhaler Inhale 1 puff into the lungs QID. 6.7 g 11   amLODipine (NORVASC) 2.5 MG tablet Take 1 tablet (2.5 mg total) by mouth daily. 90 tablet 1   atorvastatin (LIPITOR) 10 MG tablet Take 1 tablet (10 mg total) by mouth daily. 90 tablet 1   Blood Glucose Monitoring Suppl (ACCU-CHEK GUIDE ME) w/Device KIT Test once daily 1 kit 0   budesonide-formoterol (SYMBICORT) 80-4.5 MCG/ACT inhaler Inhale 2 puffs into the lungs 2 (two) times daily. 1 each 12   cetirizine (ZYRTEC) 10 MG tablet Take 1 tablet (10 mg total) by mouth daily. 30 tablet 2   Cholecalciferol (VITAMIN D) 50 MCG (2000 UT) CAPS Take by mouth.     fluticasone (FLONASE) 50 MCG/ACT nasal spray Place 2 sprays into both nostrils daily. 16 g 6   Ibuprofen (ADVIL PO) Take by mouth as needed.     losartan (COZAAR) 100 MG tablet Take 1 tablet by mouth once daily 90 tablet 1   meloxicam (MOBIC) 7.5 MG tablet Take 1 tablet (7.5 mg total) by mouth daily. 30 tablet 0   metFORMIN (GLUCOPHAGE) 500 MG tablet Take 1 tablet (500 mg total) by mouth 2 (two) times daily. (Patient taking differently: Take 500 mg by  mouth 2 (two) times daily with a meal. Pt taking 500 mg in AM and 1000 mg in PM) 180 tablet 6   montelukast (SINGULAIR) 10 MG tablet Take 1 tablet (10 mg total) by mouth at bedtime. 30 tablet 5   Omega 3 1000 MG CAPS Take 1 capsule (1,000 mg total) by mouth daily.     oxybutynin (DITROPAN) 5 MG tablet Take 1 tablet (5 mg total) by mouth 2 (two) times daily. 60 tablet 2   predniSONE (DELTASONE) 10 MG tablet Taper 6,6,6,5,5,5,4,4,3,3,2,2,1,1 53 tablet 1   spironolactone (ALDACTONE) 50 MG tablet Take 1 tablet by mouth daily.     No current facility-administered medications for this visit.    Past Medical History:  Diagnosis Date   Anxiety    Aortic aneurysm ([place])    Asthma    Bell's palsy    age 47 and age 70    COPD (chronic obstructive pulmonary disease) ([place])    Deaf, right    Depression    Diabetes mellitus without complication ([place])    Hearing aid worn    left   Hyperlipidemia    Hypertension    Shingles 08/13/2020   Varicose veins of legs    Vertigo    random, approx 1x/month   Wears dentures    full upper and lower  Past Surgical History:  Procedure Laterality Date   ABDOMINAL HYSTERECTOMY     BREAST EXCISIONAL BIOPSY Left 1976   neg   CATARACT EXTRACTION W/ INTRAOCULAR LENS  IMPLANT, BILATERAL     COLONOSCOPY  2014   normal   cyst on bladder removed     cyst removed breast Left    KNEE ARTHROSCOPY WITH MEDIAL MENISECTOMY Left 10/20/2019   Procedure: KNEE ARTHROSCOPY WITH PARTIAL MEDIAL MENISECTOMY;  Surgeon: Leim Fabry, MD;  Location: [facility];  Service: Orthopedics;  Laterality: Left;  DIABETIC - oral meds   PARTIAL HYSTERECTOMY     TUBAL LIGATION       Social History   Tobacco Use   Smoking status: Former    Packs/day: 2.00    Years: 10.00    Pack years: 20.00    Types: Cigarettes    Quit date: 05/11/1999    Years since quitting: 22.4   Smokeless tobacco: Never   Tobacco comments:    Smoking cessation materials not required  Vaping Use    Vaping Use: Never used  Substance Use Topics   Alcohol use: No    Alcohol/week: 0.0 standard drinks   Drug use: No      Family History  Problem Relation Age of Onset   Diabetes Mother    Stroke Mother    Healthy Daughter    Cancer Son 57       lung   Hypertension Son    Rheum arthritis Daughter    Hypertension Daughter    Arthritis Daughter    COPD Son    Breast cancer Neg Hx      Allergies  Allergen Reactions   Penicillins Itching   Sulfa Antibiotics Itching    REVIEW OF SYSTEMS (Negative unless checked)   Constitutional: _0 Weight loss  _1 Fever  _2 Chills Cardiac: _3 Chest pain   _4 Chest pressure   _5 Palpitations   _6 Shortness of breath when laying flat   _7 Shortness of breath at rest   _8 Shortness of breath with exertion. Vascular:  _9 Pain in legs with walking   _10 Pain in legs at rest   _11 Pain in legs when laying flat   _12 Claudication   _13 Pain in feet when walking  _14 Pain in feet at rest  _15 Pain in feet when laying flat   _16 History of DVT   _17 Phlebitis   _18 Swelling in legs   _19 Varicose veins   _20 Non-healing ulcers Pulmonary:   _21 Uses home oxygen   _22 Productive cough   _23 Hemoptysis   _24 Wheeze  _25 COPD   _26 Asthma Neurologic:  _27 Dizziness  _28 Blackouts   _29 Seizures   _30 History of stroke   _31 History of TIA  _32 Aphasia   _33 Temporary blindness   _34 Dysphagia   _35 Weakness or numbness in arms   _36 Weakness or numbness in legs Musculoskeletal:  _37 Arthritis   _38 Joint swelling   _39 Joint pain   _40 Low back pain Hematologic:  _41 Easy bruising  _42 Easy bleeding   _43 Hypercoagulable state   _44 Anemic  _45 Hepatitis Gastrointestinal:  _46 Blood in stool   _47 Vomiting blood  _48 Gastroesophageal reflux/heartburn   _49 Abdominal pain Genitourinary:  _50 Chronic kidney disease   _51 Difficult urination  _52 Frequent urination  _53 Burning with urination   _54 Hematuria Skin:  _55 Rashes   _56 Ulcers   _57 Wounds Psychological:  _58 History of anxiety   _59  History of major depression.   Physical Examination  BP (!)  167/84    Pulse 82    Ht _60  (1.575 m)    Wt 127 lb (57.6 kg)    BMI 23.23 kg/m  Gen:  WD/WN, NAD. Appears younger than stated age. Head: [place]/AT, No temporalis wasting. Ear/Nose/Throat: Hearing grossly intact, nares w/o erythema or drainage Eyes: Conjunctiva clear. Sclera non-icteric Neck: Supple.  Trachea midline Pulmonary:  Good air movement, no use of accessory muscles.  Cardiac: RRR, no JVD Vascular:  Vessel Right Left  Radial Palpable Palpable                          PT Palpable Palpable  DP Palpable Palpable   Gastrointestinal: soft, non-tender/non-distended. Increased aortic impulse Musculoskeletal: M/S 5/5 throughout.  No deformity or atrophy. No edema. Neurologic: Sensation grossly intact in extremities.  Symmetrical.  Speech is fluent.  Psychiatric: Judgment intact, Mood & affect appropriate for pt's clinical situation. Dermatologic: No rashes or ulcers noted.  No cellulitis or open wounds.      Labs Recent Results (from the past 2160 hour(s))  I-STAT creatinine     Status: None   Collection Time: 10/21/21 10:36 AM  Result Value Ref Range   Creatinine, Ser 0.80 0.44 - 1.00 mg/dL    Radiology DG Lumbar Spine Complete  Result Date: 10/11/2021 CLINICAL DATA:  Left buttock pain EXAM: LUMBAR SPINE - COMPLETE 4+ VIEW COMPARISON:  None. FINDINGS: Normal lumbar lordosis. No acute fracture or listhesis of the lumbar spine. Vertebral body height has been preserved. Intervertebral disc height is preserved. Mild endplate remodeling is seen throughout the lumbar spine in keeping with mild degenerative disc disease. 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 IMPRESSION: No acute fracture or listhesis of the lumbar spine. Suspected minimum 5.4 cm saccular aneurysm of the infrarenal abdominal aorta, likely enlarged since prior CT  arteriogram of 07/31/2020. Given the patient's history of left pelvic and left hip pain, repeat CT arteriography is recommended for further evaluation. These results will be called to the ordering clinician or representative by the Radiologist Assistant, and communication documented in the PACS or [employer]. Electronically Signed   By: Fidela Salisbury M.D.   On: 10/11/2021 23:29   DG Hip Unilat W OR W/O Pelvis 2-3 Views Left  Result Date: 10/11/2021 CLINICAL DATA:  Pelvic and left hip pain EXAM: DG HIP (WITH OR WITHOUT PELVIS) 2-3V LEFT COMPARISON:  09/01/2018 FINDINGS: Normal alignment. No acute fracture or dislocation. Mild bilateral degenerative hip arthritis. Sacroiliac joint spaces are preserved. Vascular calcifications are seen within the abdominal aorta IMPRESSION: Mild bilateral degenerative hip arthritis. Electronically Signed   By: Fidela Salisbury M.D.   On: 10/11/2021 23:25   CT Angio Abd/Pel w/ and/or w/o  Result Date: 10/21/2021 CLINICAL DATA:  Abdominal aortic aneurysm. EXAM: CTA ABDOMEN AND PELVIS WITHOUT AND WITH CONTRAST TECHNIQUE: Multidetector CT imaging of the abdomen and pelvis was performed using the standard protocol during bolus administration of intravenous contrast. Multiplanar reconstructed images and MIPs were obtained and reviewed to evaluate the vascular anatomy. RADIATION DOSE REDUCTION: This exam was performed according to the departmental dose-optimization program which includes automated exposure control, adjustment of the mA and/or kV according to patient size and/or use of iterative reconstruction technique. CONTRAST:  88m OMNIPAQUE IOHEXOL 350 MG/ML SOLN COMPARISON:  07/31/2020 FINDINGS: VASCULAR Aorta: Again noted is an aneurysm involving the distal abdominal aorta. This aneurysm has saccular type characteristics and eccentric towards the left side. Aneurysm has enlarged in size measuring up to 4.8 cm and previously measured 4.5 cm. Aneurysm is  approximately 3.2 cm  distal to the left renal artery. Distal abdominal aorta is tortuous. Negative for dissection or inflammatory changes. Distal descending thoracic aorta measures 2.6 cm. Aorta just below the renal arteries measures 1.9 cm. Celiac: Patent without evidence of aneurysm, dissection, vasculitis or significant stenosis. SMA: Patent without evidence of aneurysm, dissection, vasculitis or significant stenosis. Renals: Both renal arteries are patent without evidence of aneurysm, dissection, vasculitis, fibromuscular dysplasia or significant stenosis. IMA: Patent Inflow: Acute angulation of the left common iliac artery just beyond the origin. No significant stenosis involving the bilateral common iliac arteries. Normal caliber of the common iliac arteries, left common iliac artery measures up to 1.2 cm. Atherosclerotic calcifications involving the bilateral external and internal iliac arteries without significant stenosis. Negative for iliac artery dissection. Proximal Outflow: Calcified plaque involving bilateral common femoral arteries. Proximal femoral arteries are patent bilaterally. Veins: Portal venous system is patent. IVC and renal veins are patent. Review of the MIP images confirms the above findings. NON-VASCULAR Lower chest: Questionable nodule in the right middle lobe on sequence 6, image 10 measures 4 mm and probably measured 3 mm in 2021. Not clear if this represents a true nodule versus confluence of vessels. Hepatobiliary: Normal appearance of the liver and gallbladder. Chronic dilatation of the distal common bile duct measuring 8 mm. Pancreas: Dilatation of the main pancreatic duct is unchanged. Again noted is a small tubular-like structure near the pancreatic head best seen on the coronal images, sequence 8 image 58 that measures roughly 1.0 cm and measured approximately 0.9 cm in 2021. No inflammatory changes around the pancreas. Again noted are foci of low-density in the pancreas suggestive for focal fat.  Spleen: Normal in size without focal abnormality. Adrenals/Urinary Tract: Chronic fullness of the left adrenal gland. Right adrenal gland is poorly visualized but minimally changed from the previous examination. Stable appearance of both kidneys without hydronephrosis. Probable cortical scarring in the left kidney upper pole. Normal appearance of the urinary bladder. Stomach/Bowel: Moderate amount of stool in the colon. No evidence for bowel dilatation or acute inflammation. Normal appearance of the stomach. Lymphatic: No lymph node enlargement in the abdomen or pelvis. Reproductive: Hysterectomy. Small low-density / cystic structures in the left adnexa are minimally changed since 2021. This left adnexal structure roughly measures 1.7 x 1.3 cm on sequence 4 image 157. No abnormal right adnexal tissue. Other: Negative for ascites.  Negative for free air. Musculoskeletal: Degenerative changes in both hips. No acute bone abnormality. IMPRESSION: VASCULAR 1. Enlargement of the distal abdominal aortic aneurysm measuring up to 4.8 cm and measured 4.5 cm on 07/31/2020. This abdominal aortic aneurysm has saccular characteristics as described. NON-VASCULAR 1. No acute abnormality in the abdomen or pelvis. 2. 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 This has minimally changed since 2021. Recommend follow up pre and post contrast MRI/MRCP or pancreatic protocol CT in 2 years. This recommendation follows ACR consensus guidelines: Management of Incidental Pancreatic Cysts: A White Paper of the ACR Incidental Findings Committee. J Am Coll Radiol 4098;11:914-782. 3. Stable small low-density structures in left adnexa. This could represent small cystic structures and minimally changed since 2021. Recommend attention to this area on follow up imaging. 4. Questionable 4 mm nodule in the right middle lobe. Recommend attention to this area on follow up imaging. Electronically Signed    By: Markus Daft M.D.   On: 10/21/2021 12:45    Assessment/Plan Diabetes blood glucose control important in reducing the progression of  atherosclerotic disease. Also, involved in wound healing. On appropriate medications.     BP (high blood pressure) blood pressure control important in reducing the progression of atherosclerotic disease and aneurysmal growth. On appropriate oral medications.  AAA (abdominal aortic aneurysm) without rupture ([place]) She has undergone a CT angiogram of the abdomen pelvis since her last visit which I have independently reviewed.  This demonstrates an approximately 4.7 to 4.8 cm distal infrarenal abdominal aortic aneurysm.  This has grown slightly from her previous size of 4.5 cm but not as large as her plain x-ray had suggested recently.  At this point, she has not quite reached the threshold for prophylactic repair but is approaching it.  We will do a short interval follow-up of 3 months with duplex and we discussed that once she reaches 5 cm, prophylactic repair would be indicated.  She has favorable anatomy for a stent graft repair.    Leotis Pain, MD  10/28/2021 10:04 AM    This note was created with Dragon medical transcription system.  Any errors from dictation are purely unintentional

## 2021-10-28 NOTE — Patient Instructions (Signed)
Abdominal Aortic Aneurysm  An aneurysm is a bulge in a blood vessel that carries blood away from the heart (artery). It happens when blood pushes against a weak or damaged place on the wall of the blood vessel. An abdominal aortic aneurysm happens in the main blood vessel that carries blood away from the heart (aorta). Most aneurysms do not cause problems, but some do cause problems. If an aneurysm grows, it can burst or tear. This causes bleeding inside you. It is anemergency. It can be life-threatening. What are the causes? The exact cause of this condition is not known. What increases the risk? The following factors may make you more likely to develop this condition: Being female and 60 years of age or older. Being of [place] descent. Using nicotine or tobacco now or in the past. Having a family history of aneurysms. Having any of these problems: Hardening of blood vessels that carry blood away from the heart. Irritation and swelling of the walls of blood vessels that carry blood away from the heart. Certain genetic problems. Being very overweight. An infection in the wall of your aorta. High cholesterol. High blood pressure (hypertension). What are the signs or symptoms? Symptoms depend on the size of your aneurysm and how fast it is growing. Most aneurysms grow slowly and do not cause symptoms. If symptoms happen, you may: Have very bad pain in your belly (abdomen), side, or low back. Feel full after eating only a little food. Feel a throbbing lump in your belly. Have these problems with your feet or toes: Pain. Skin turning blue. Sores. Have trouble pooping (constipation). Have trouble peeing (urinating). If your aneurysm bursts, you may: Feel sudden, very bad pain in the belly, side, or back. Feel like you may vomit. Vomit. Feel light-headed. Faint. How is this treated? Treatment for this condition depends on: The size of your aneurysm. How fast it is  growing. Your age. Your risk of having the aneurysm burst. If your aneurysm is smaller than 2 inches (5 cm), your doctor may: Check it often to see if it is growing. You may have an imaging test (ultrasound) to check it every 3-6 months, every year, or every few years. Give you medicines for: High blood pressure. Pain. Infection. If your aneurysm is larger than 2 inches (5 cm), you may need surgery to fix it. Follow these instructions at home: Eating and drinking  Eat a heart-healthy diet. Eat a lot of: Fresh fruits and vegetables. Whole grains. Low-fat (lean) protein. Low-fat dairy products. Avoid foods that are high in saturated fat and cholesterol. These foods include red meat and some dairy products.  Lifestyle     Do not use any products that contain nicotine or tobacco, such as cigarettes, e-cigarettes, and chewing tobacco. If you need help quitting, ask your doctor. Stay active and get exercise. Ask your doctor how often to exercise and what types of exercise are safe for you. Keep a healthy weight. Alcohol use Do not drink alcohol if: Your doctor tells you not to drink. You are pregnant, may be pregnant, or are planning to become pregnant. If you drink alcohol: Limit how much you use to: 0-1 drink a day for women. 0-2 drinks a day for men. Be aware of how much alcohol is in your drink. In the [place] one drink equals one 12 oz bottle of beer (355 mL), one 5 oz glass of wine (148 mL), or one 1 oz glass of hard liquor (44 mL). General instructions   Take over-the-counter and prescription medicines only as told by your doctor. Keep your blood pressure in a normal range. Check it at regular times. Ask your doctor what level it should be. Have regular checks of your levels of blood sugar (glucose) and cholesterol. Follow steps to keep these levels near normal. Avoid heavy lifting and activities that take a lot of effort. Ask what activities are safe for you. If you can, learn  your family's health history. Keep all follow-up visits as told by your doctor. This is important. Contact a doctor if: Your belly, side, or back hurts. Your belly throbs. You have a fever. Get help right away if: You have sudden, bad pain in your belly, side, or back. You feel like you may vomit or you vomit. You feel light-headed or you faint. Your heart beats fast when you stand. You have sweaty skin that is cold to the touch (clammy). You are short of breath. You have trouble pooping. You have trouble peeing. These symptoms may be an emergency. Do not wait to see if the symptoms will go away. Get medical help right away. Call your local emergency services (911 in the [place] Do not drive yourself to the hospital. Summary An aneurysm is a bulge in one of the blood vessels that carry blood away from the heart (artery). An abdominal aortic aneurysm happens in the main blood vessel that carries blood away from the heart (aorta). This condition can cause bleeding inside the body. It can be life-threatening. Risk can rise if you are female, age 60 or older, and of [place] descent. Risk can also rise from nicotine or tobacco use or having aneurysms in the family. Get help right away if you have symptoms of a burst aneurysm. This information is not intended to replace advice given to you by your health care provider. Make sure you discuss any questions you have with your healthcare provider. Document Revised: 06/09/2019 Document Reviewed: 06/09/2019 Elsevier Patient Education  2022 [employer] Inc[employer]  

## 2021-10-28 NOTE — Assessment & Plan Note (Signed)
She has undergone a CT angiogram of the abdomen pelvis since her last visit which I have independently reviewed.  This demonstrates an approximately 4.7 to 4.8 cm distal infrarenal abdominal aortic aneurysm.  This has grown slightly from her previous size of 4.5 cm but not as large as her plain x-ray had suggested recently.  At this point, she has not quite reached the threshold for prophylactic repair but is approaching it.  We will do a short interval follow-up of 3 months with duplex and we discussed that once she reaches 5 cm, prophylactic repair would be indicated.  She has favorable anatomy for a stent graft repair.

## 2021-11-18 ENCOUNTER — Encounter (HOSPITAL_COMMUNITY): Payer: Self-pay | Admitting: Radiology

## 2021-12-19 DIAGNOSIS — E1169 Type 2 diabetes mellitus with other specified complication: Secondary | ICD-10-CM | POA: Diagnosis not present

## 2021-12-19 DIAGNOSIS — E785 Hyperlipidemia, unspecified: Secondary | ICD-10-CM | POA: Diagnosis not present

## 2021-12-19 DIAGNOSIS — M810 Age-related osteoporosis without current pathological fracture: Secondary | ICD-10-CM | POA: Diagnosis not present

## 2021-12-19 LAB — HM DIABETES FOOT EXAM: HM Diabetic Foot Exam: NORMAL

## 2021-12-19 LAB — HEMOGLOBIN A1C: Hemoglobin A1C: 7.3

## 2021-12-23 ENCOUNTER — Encounter: Payer: Self-pay | Admitting: Family Medicine

## 2021-12-23 ENCOUNTER — Ambulatory Visit (INDEPENDENT_AMBULATORY_CARE_PROVIDER_SITE_OTHER): Payer: Medicare Other | Admitting: Family Medicine

## 2021-12-23 VITALS — BP 136/82 | HR 56 | Ht 62.0 in | Wt 127.0 lb

## 2021-12-23 DIAGNOSIS — E119 Type 2 diabetes mellitus without complications: Secondary | ICD-10-CM

## 2021-12-23 DIAGNOSIS — J449 Chronic obstructive pulmonary disease, unspecified: Secondary | ICD-10-CM | POA: Diagnosis not present

## 2021-12-23 DIAGNOSIS — R454 Irritability and anger: Secondary | ICD-10-CM | POA: Diagnosis not present

## 2021-12-23 DIAGNOSIS — J301 Allergic rhinitis due to pollen: Secondary | ICD-10-CM | POA: Diagnosis not present

## 2021-12-23 DIAGNOSIS — I10 Essential (primary) hypertension: Secondary | ICD-10-CM | POA: Diagnosis not present

## 2021-12-23 DIAGNOSIS — Z780 Asymptomatic menopausal state: Secondary | ICD-10-CM

## 2021-12-23 DIAGNOSIS — E7849 Other hyperlipidemia: Secondary | ICD-10-CM

## 2021-12-23 DIAGNOSIS — N3281 Overactive bladder: Secondary | ICD-10-CM

## 2021-12-23 MED ORDER — SERTRALINE HCL 25 MG PO TABS: 25.0000 mg | ORAL_TABLET | Freq: Every day | ORAL | 3 refills | Status: DC

## 2021-12-23 MED ORDER — MONTELUKAST SODIUM 10 MG PO TABS: 10.0000 mg | ORAL_TABLET | Freq: Every day | ORAL | 5 refills | Status: DC

## 2021-12-23 MED ORDER — FLUTICASONE PROPIONATE 50 MCG/ACT NA SUSP: 2.0000 | Freq: Every day | NASAL | 6 refills | Status: DC

## 2021-12-23 MED ORDER — LOSARTAN POTASSIUM 100 MG PO TABS: 100.0000 mg | ORAL_TABLET | Freq: Every day | ORAL | 1 refills | Status: DC

## 2021-12-23 MED ORDER — CETIRIZINE HCL 10 MG PO TABS: 10.0000 mg | ORAL_TABLET | Freq: Every day | ORAL | 2 refills | Status: DC

## 2021-12-23 MED ORDER — AMLODIPINE BESYLATE 2.5 MG PO TABS: 2.5000 mg | ORAL_TABLET | Freq: Every day | ORAL | 1 refills | Status: DC

## 2021-12-23 MED ORDER — ATORVASTATIN CALCIUM 10 MG PO TABS: 10.0000 mg | ORAL_TABLET | Freq: Every day | ORAL | 1 refills | Status: DC

## 2021-12-23 MED ORDER — BUDESONIDE-FORMOTEROL FUMARATE 80-4.5 MCG/ACT IN AERO: 2.0000 | INHALATION_SPRAY | Freq: Two times a day (BID) | RESPIRATORY_TRACT | 12 refills | Status: DC

## 2021-12-23 MED ORDER — OXYBUTYNIN CHLORIDE 5 MG PO TABS: 5.0000 mg | ORAL_TABLET | Freq: Two times a day (BID) | ORAL | 2 refills | Status: DC

## 2021-12-23 NOTE — Progress Notes (Signed)
? ? ?Date:  12/23/2021  ? ?Name:  Jill Guzman   DOB:  04/05/1941   MRN:  [id number] ? ? ?Chief Complaint: Hypertension, Hyperlipidemia, COPD, Allergic Rhinitis , and overactive bladder ? ?Hypertension ?This is a chronic problem. The current episode started more than 1 year ago. The problem has been gradually improving since onset. The problem is controlled. Pertinent negatives include no chest pain, headaches, palpitations, peripheral edema or shortness of breath. Risk factors for coronary artery disease include dyslipidemia and diabetes mellitus. Past treatments include calcium channel blockers and angiotensin blockers. The current treatment provides moderate improvement. There are no compliance problems.  There is no history of angina, kidney disease, CAD/MI, CVA, heart failure, left ventricular hypertrophy, PVD or retinopathy. There is no history of chronic renal disease, a hypertension causing med or renovascular disease.  ?Hyperlipidemia ?This is a chronic problem. The current episode started more than 1 year ago. The problem is controlled. Recent lipid tests were reviewed and are normal. She has no history of chronic renal disease, diabetes, hypothyroidism, liver disease, obesity or nephrotic syndrome. Pertinent negatives include no chest pain, myalgias or shortness of breath. Current antihyperlipidemic treatment includes statins. The current treatment provides moderate improvement of lipids. There are no compliance problems.  There are no known risk factors for coronary artery disease.  ?COPD ?She complains of wheezing. There is no cough, shortness of breath or sputum production. This is a chronic problem. The problem has been waxing and waning. Pertinent negatives include no chest pain, ear pain, fever, headaches, myalgias, rhinorrhea, sneezing or sore throat. Her symptoms are alleviated by beta-agonist and steroid inhaler. She reports moderate improvement on treatment. Her past medical history is  significant for COPD.  ?Female GU Problem ?Primary symptoms comment: overactive bladder. The current episode started more than 1 year ago. The problem occurs intermittently. Associated symptoms include frequency and urgency. Pertinent negatives include no abdominal pain, back pain, chills, constipation, diarrhea, dysuria, fever, flank pain, headaches, hematuria, nausea, rash or sore throat. Associated symptoms comments: nocturia.  ? ?Lab Results  ?Component Value Date  ? NA 135 (A) 06/13/2021  ? NA 139 06/13/2021  ? K 4.8 06/13/2021  ? CO2 22 12/31/2020  ? GLUCOSE 126 (H) 12/31/2020  ? BUN 18 06/13/2021  ? CREATININE 0.80 10/21/2021  ? CALCIUM 10.8 (H) 12/31/2020  ? EGFR 89 12/31/2020  ? GFRNONAA >60 07/31/2020  ? ?Lab Results  ?Component Value Date  ? CHOL 175 06/13/2021  ? HDL 49 06/13/2021  ? [place] 110 06/13/2021  ? TRIG 84 06/13/2021  ? CHOLHDL 2.9 03/29/2019  ? ?Lab Results  ?Component Value Date  ? TSH 1.69 06/13/2021  ? ?Lab Results  ?Component Value Date  ? HGBA1C 7.3 12/19/2021  ? ?Lab Results  ?Component Value Date  ? WBC 8.3 03/29/2019  ? HGB 14.1 03/29/2019  ? HCT 42.9 03/29/2019  ? MCV 85 03/29/2019  ? PLT 225 03/29/2019  ? ?Lab Results  ?Component Value Date  ? ALT 9 06/13/2021  ? AST 10 (A) 06/13/2021  ? ALKPHOS 51 12/31/2020  ? BILITOT 0.5 12/31/2020  ? ?No results found for: 25OHVITD2, [place], VD25OH  ? ?Review of Systems  ?Constitutional: Negative.  Negative for chills, fatigue, fever and unexpected weight change.  ?HENT:  Negative for congestion, ear discharge, ear pain, rhinorrhea, sinus pressure, sneezing and sore throat.   ?Respiratory:  Positive for wheezing. Negative for cough, sputum production, shortness of breath and stridor.   ?Cardiovascular:  Negative for chest pain and  palpitations.  ?Gastrointestinal:  Negative for abdominal pain, blood in stool, constipation, diarrhea and nausea.  ?Genitourinary:  Positive for frequency and urgency. Negative for dysuria, flank pain and  hematuria.  ?     Nocturia  ?Musculoskeletal:  Negative for arthralgias, back pain and myalgias.  ?Skin:  Negative for rash.  ?Neurological:  Negative for dizziness, weakness and headaches.  ?Hematological:  Negative for adenopathy. Does not bruise/bleed easily.  ?Psychiatric/Behavioral:  Negative for dysphoric mood. The patient is not nervous/anxious.   ? ?Patient Active Problem List  ? Diagnosis Date Noted  ? AAA (abdominal aortic aneurysm) without rupture (HCC) 08/09/2020  ? 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 Nonexudative age-related macular degeneration, bilateral, early dry stage 03/27/2019  ? Recurrent major depressive disorder, in partial remission (HCC) 02/14/2018  ? Chronic anxiety 02/09/2017  ? AB (asthmatic bronchitis), mild intermittent, uncomplicated 02/09/2017  ? Chronic obstructive pulmonary disease (HCC) 07/14/2016  ? 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 Clinical depression 12/31/2014  ? AB (asthmatic bronchitis) 12/31/2014  ? Routine general medical examination at a health care facility 12/31/2014  ? Diabetes (HCC) 12/31/2014  ? BP (high blood pressure) 12/31/2014  ? Osteopenia 12/31/2014  ? ? ?Allergies  ?Allergen Reactions  ? Penicillins Itching  ? Sulfa Antibiotics Itching  ? ? ?Past Surgical History:  ?Procedure Laterality Date  ? ABDOMINAL HYSTERECTOMY    ? BREAST EXCISIONAL BIOPSY Left 1976  ? neg  ? CATARACT EXTRACTION W/ INTRAOCULAR LENS  IMPLANT, BILATERAL    ? COLONOSCOPY  2014  ? normal  ? cyst on bladder removed    ? cyst removed breast Left   ? KNEE ARTHROSCOPY WITH MEDIAL MENISECTOMY Left 10/20/2019  ? Procedure: KNEE ARTHROSCOPY WITH PARTIAL MEDIAL MENISECTOMY;  Surgeon: Patel, Sunny, MD;  Location: [facility];  Service: Orthopedics;  Laterality: Left;  DIABETIC - oral meds  ? PARTIAL HYSTERECTOMY    ? TUBAL LIGATION    ? ? ?Social History  ? ?Tobacco Use  ? Smoking status: Former  ?  Packs/day: 2.00  ?  Years: 10.00  ?  Pack years: 20.00  ?   Types: Cigarettes  ?  Quit date: 05/11/1999  ?  Years since quitting: 22.6  ? Smokeless tobacco: Never  ? Tobacco comments:  ?  Smoking cessation materials not required  ?Vaping Use  ? Vaping Use: Never used  ?Substance Use Topics  ? Alcohol use: No  ?  Alcohol/week: 0.0 standard drinks  ? Drug use: No  ? ? ? ?Medication list has been reviewed and updated. ? ?Current Meds  ?Medication Sig  ? Acetaminophen (TYLENOL PO) Take by mouth as needed.  ? albuterol (VENTOLIN HFA) 108 (90 Base) MCG/ACT inhaler Inhale 1 puff into the lungs QID.  ? amLODipine (NORVASC) 2.5 MG tablet Take 1 tablet (2.5 mg total) by mouth daily.  ? atorvastatin (LIPITOR) 10 MG tablet Take 1 tablet (10 mg total) by mouth daily.  ? Blood Glucose Monitoring Suppl (ACCU-CHEK GUIDE ME) w/Device KIT Test once daily  ? budesonide-formoterol (SYMBICORT) 80-4.5 MCG/ACT inhaler Inhale 2 puffs into the lungs 2 (two) times daily.  ? cetirizine (ZYRTEC) 10 MG tablet Take 1 tablet (10 mg total) by mouth daily.  ? Cholecalciferol (VITAMIN D) 50 MCG (2000 UT) CAPS Take by mouth.  ? fluticasone (FLONASE) 50 MCG/ACT nasal spray Place 2 sprays into both nostrils daily.  ? Ibuprofen (ADVIL PO) Take by mouth as needed.  ? losartan (COZAAR) 100 MG tablet Take 1 tablet by   mouth once daily  ? meloxicam (MOBIC) 7.5 MG tablet Take 1 tablet (7.5 mg total) by mouth daily.  ? metFORMIN (GLUCOPHAGE) 500 MG tablet Take 1 tablet (500 mg total) by mouth 2 (two) times daily. (Patient taking differently: Take 500 mg by mouth 2 (two) times daily with a meal. Pt taking 500 mg in AM and 500mg at night- endocrinology)  ? montelukast (SINGULAIR) 10 MG tablet Take 1 tablet (10 mg total) by mouth at bedtime.  ? Omega 3 1000 MG CAPS Take 1 capsule (1,000 mg total) by mouth daily.  ? oxybutynin (DITROPAN) 5 MG tablet Take 1 tablet (5 mg total) by mouth 2 (two) times daily.  ? spironolactone (ALDACTONE) 50 MG tablet Take 1 tablet by mouth daily.  ? ? ? ?  12/23/2021  ?  8:25 AM 10/09/2021  ?   4:05 PM 06/24/2021  ?  9:18 AM 02/05/2021  ?  9:11 AM  ?GAD 7 : Generalized Anxiety Score  ?Nervous, Anxious, on Edge 1 0 2 0  ?Control/stop worrying 1 0 2 0  ?Worry too much - different things 1 0 2 0  ?Trouble relaxing

## 2022-01-15 ENCOUNTER — Other Ambulatory Visit: Payer: Self-pay | Admitting: Family Medicine

## 2022-01-15 DIAGNOSIS — J4520 Mild intermittent asthma, uncomplicated: Secondary | ICD-10-CM

## 2022-01-27 ENCOUNTER — Ambulatory Visit (INDEPENDENT_AMBULATORY_CARE_PROVIDER_SITE_OTHER): Payer: Medicare Other | Admitting: Vascular Surgery

## 2022-01-27 ENCOUNTER — Encounter (INDEPENDENT_AMBULATORY_CARE_PROVIDER_SITE_OTHER): Payer: Self-pay | Admitting: Vascular Surgery

## 2022-01-27 ENCOUNTER — Ambulatory Visit (INDEPENDENT_AMBULATORY_CARE_PROVIDER_SITE_OTHER): Payer: Medicare Other

## 2022-01-27 VITALS — BP 116/67 | HR 79 | Resp 16 | Ht 62.0 in | Wt 125.0 lb

## 2022-01-27 DIAGNOSIS — I10 Essential (primary) hypertension: Secondary | ICD-10-CM | POA: Diagnosis not present

## 2022-01-27 DIAGNOSIS — I7143 Infrarenal abdominal aortic aneurysm, without rupture: Secondary | ICD-10-CM

## 2022-01-27 DIAGNOSIS — E119 Type 2 diabetes mellitus without complications: Secondary | ICD-10-CM

## 2022-01-27 NOTE — Progress Notes (Signed)
MRN : [id number]  Jill Guzman is a 81 y.o. ([birth year]) female who presents with chief complaint of No chief complaint on file. Marland Kitchen  History of Present Illness: Patient returns today in follow up of her abdominal aortic aneurysm.  She has no aneurysm related symptoms and no complaints today. Specifically, the patient denies new back or abdominal pain, or signs of peripheral embolization. Duplex today shows stable 4.8 cm infrarenal AAA.   Current Outpatient Medications  Medication Sig Dispense Refill   Acetaminophen (TYLENOL PO) Take by mouth as needed.     albuterol (VENTOLIN HFA) 108 (90 Base) MCG/ACT inhaler INHALE 1 PUFF BY MOUTH 4 TIMES DAILY 8 g 0   amLODipine (NORVASC) 2.5 MG tablet Take 1 tablet (2.5 mg total) by mouth daily. 90 tablet 1   atorvastatin (LIPITOR) 10 MG tablet Take 1 tablet (10 mg total) by mouth daily. 90 tablet 1   Blood Glucose Monitoring Suppl (ACCU-CHEK GUIDE ME) w/Device KIT Test once daily 1 kit 0   budesonide-formoterol (SYMBICORT) 80-4.5 MCG/ACT inhaler Inhale 2 puffs into the lungs 2 (two) times daily. 1 each 12   cetirizine (ZYRTEC) 10 MG tablet Take 1 tablet (10 mg total) by mouth daily. 30 tablet 2   Cholecalciferol (VITAMIN D) 50 MCG (2000 UT) CAPS Take by mouth.     fluticasone (FLONASE) 50 MCG/ACT nasal spray Place 2 sprays into both nostrils daily. 16 g 6   losartan (COZAAR) 100 MG tablet Take 1 tablet (100 mg total) by mouth daily. 90 tablet 1   meloxicam (MOBIC) 7.5 MG tablet Take 1 tablet (7.5 mg total) by mouth daily. 30 tablet 0   metFORMIN (GLUCOPHAGE) 500 MG tablet Take 1 tablet (500 mg total) by mouth 2 (two) times daily. (Patient taking differently: Take 500 mg by mouth 2 (two) times daily with a meal. Pt taking 500 mg in AM and 51m at night- endocrinology) 180 tablet 6   Omega 3 1000 MG CAPS Take 1 capsule (1,000 mg total) by mouth daily.     oxybutynin (DITROPAN) 5 MG tablet Take 1 tablet (5 mg total) by mouth 2 (two) times daily. 60  tablet 2   sertraline (ZOLOFT) 25 MG tablet Take 1 tablet (25 mg total) by mouth daily. 30 tablet 3   spironolactone (ALDACTONE) 50 MG tablet Take 1 tablet by mouth daily.     Ibuprofen (ADVIL PO) Take by mouth as needed. (Patient not taking: Reported on 01/27/2022)     montelukast (SINGULAIR) 10 MG tablet Take 1 tablet (10 mg total) by mouth at bedtime. (Patient not taking: Reported on 01/27/2022) 30 tablet 5   No current facility-administered medications for this visit.    Past Medical History:  Diagnosis Date   Anxiety    Aortic aneurysm ([facility]    Asthma    Bell's palsy    age [age]and age 81   COPD (chronic obstructive pulmonary disease) ([facility]    Deaf, right    Depression    Diabetes mellitus without complication ([facility]    Hearing aid worn    left   Hyperlipidemia    Hypertension    Shingles 08/13/2020   Varicose veins of legs    Vertigo    random, approx 1x/month   Wears dentures    full upper and lower    Past Surgical History:  Procedure Laterality Date   ABDOMINAL HYSTERECTOMY     BREAST EXCISIONAL BIOPSY Left 1976   neg   CATARACT EXTRACTION  W/ INTRAOCULAR LENS  IMPLANT, BILATERAL     COLONOSCOPY  2014   normal   cyst on bladder removed     cyst removed breast Left    KNEE ARTHROSCOPY WITH MEDIAL MENISECTOMY Left 10/20/2019   Procedure: KNEE ARTHROSCOPY WITH PARTIAL MEDIAL MENISECTOMY;  Surgeon: Leim Fabry, MD;  Location: [facility];  Service: Orthopedics;  Laterality: Left;  DIABETIC - oral meds   PARTIAL HYSTERECTOMY     TUBAL LIGATION       Social History   Tobacco Use   Smoking status: Former    Packs/day: 2.00    Years: 10.00    Pack years: 20.00    Types: Cigarettes    Quit date: 05/11/1999    Years since quitting: 22.7   Smokeless tobacco: Never   Tobacco comments:    Smoking cessation materials not required  Vaping Use   Vaping Use: Never used  Substance Use Topics   Alcohol use: No    Alcohol/week: 0.0 standard drinks   Drug use:  No      Family History  Problem Relation Age of Onset   Diabetes Mother    Stroke Mother    Healthy Daughter    Cancer Son 12       lung   Hypertension Son    Rheum arthritis Daughter    Hypertension Daughter    Arthritis Daughter    COPD Son    Breast cancer Neg Hx      Allergies  Allergen Reactions   Penicillins Itching   Sulfa Antibiotics Itching      Physical Examination  BP 116/67 (BP Location: Left Arm)   Pulse 79   Resp 16   Ht '5\' 2"'  (1.575 m)   Wt 125 lb (56.7 kg)   BMI 22.86 kg/m  Gen:  WD/WN, NAD. Appears younger than stated. Head: [place]/AT, No temporalis wasting. Ear/Nose/Throat: Hearing grossly intact, nares w/o erythema or drainage Eyes: Conjunctiva clear. Sclera non-icteric Neck: Supple.  Trachea midline Pulmonary:  Good air movement, no use of accessory muscles.  Cardiac: RRR, no JVD Vascular:  Vessel Right Left  Radial Palpable Palpable                          PT Palpable Palpable  DP Palpable Palpable   Gastrointestinal: soft, non-tender/non-distended. No guarding/reflex. Increased aortic impulse.  Musculoskeletal: M/S 5/5 throughout.  No deformity or atrophy. No edema. Neurologic: Sensation grossly intact in extremities.  Symmetrical.  Speech is fluent.  Psychiatric: Judgment intact, Mood & affect appropriate for pt's clinical situation. Dermatologic: No rashes or ulcers noted.  No cellulitis or open wounds.      Labs Recent Results (from the past 2160 hour(s))  Hemoglobin A1c     Status: None   Collection Time: 12/19/21 12:00 AM  Result Value Ref Range   Hemoglobin A1C 7.3   HM DIABETES FOOT EXAM     Status: None   Collection Time: 12/19/21 12:00 AM  Result Value Ref Range   HM Diabetic Foot Exam normal     Radiology No results found.  Assessment/Plan Diabetes blood glucose control important in reducing the progression of atherosclerotic disease. Also, involved in wound healing. On appropriate medications.     BP  (high blood pressure) blood pressure control important in reducing the progression of atherosclerotic disease and aneurysmal growth. On appropriate oral medications.  AAA (abdominal aortic aneurysm) without rupture (HCC) Duplex today shows stable 4.8 cm infrarenal AAA.  CT scan about 3 to 4 months ago demonstrated a similar size.  Continue close interval follow-up with duplex in 3 to 4 months.  Contact our office with problems in the interim.    Leotis Pain, MD  01/27/2022 9:04 AM    This note was created with Dragon medical transcription system.  Any errors from dictation are purely unintentional

## 2022-01-27 NOTE — Assessment & Plan Note (Signed)
Duplex today shows stable 4.8 cm infrarenal AAA.  CT scan about 3 to 4 months ago demonstrated a similar size.  Continue close interval follow-up with duplex in 3 to 4 months.  Contact our office with problems in the interim.

## 2022-02-03 ENCOUNTER — Ambulatory Visit
Admission: RE | Admit: 2022-02-03 | Discharge: 2022-02-03 | Disposition: A | Discharge status: Home / Self Care | Payer: Medicare Other | Source: Ambulatory Visit | Attending: Family Medicine | Admitting: Family Medicine

## 2022-02-03 DIAGNOSIS — Z780 Asymptomatic menopausal state: Secondary | ICD-10-CM | POA: Insufficient documentation

## 2022-02-03 DIAGNOSIS — M810 Age-related osteoporosis without current pathological fracture: Secondary | ICD-10-CM | POA: Diagnosis not present

## 2022-02-03 DIAGNOSIS — M85851 Other specified disorders of bone density and structure, right thigh: Secondary | ICD-10-CM | POA: Diagnosis not present

## 2022-02-09 DIAGNOSIS — H353131 Nonexudative age-related macular degeneration, bilateral, early dry stage: Secondary | ICD-10-CM | POA: Diagnosis not present

## 2022-02-09 DIAGNOSIS — E119 Type 2 diabetes mellitus without complications: Secondary | ICD-10-CM | POA: Diagnosis not present

## 2022-02-09 LAB — HM DIABETES EYE EXAM

## 2022-02-24 ENCOUNTER — Other Ambulatory Visit: Payer: Self-pay | Admitting: Family Medicine

## 2022-02-24 DIAGNOSIS — J4520 Mild intermittent asthma, uncomplicated: Secondary | ICD-10-CM

## 2022-02-25 NOTE — Telephone Encounter (Signed)
Requested Prescriptions  Pending Prescriptions Disp Refills  . albuterol (VENTOLIN HFA) 108 (90 Base) MCG/ACT inhaler [Pharmacy Med Name: Albuterol Sulfate HFA 108 (90 Base) MCG/ACT Inhalation Aerosol Solution] 9 g 0    Sig: INHALE 1 PUFF BY MOUTH 4 TIMES DAILY     Pulmonology:  Beta Agonists 2 Passed - 02/24/2022  2:52 PM      Passed - Last BP in normal range    BP Readings from Last 1 Encounters:  01/27/22 116/67         Passed - Last Heart Rate in normal range    Pulse Readings from Last 1 Encounters:  01/27/22 79         Passed - Valid encounter within last 12 months    Recent Outpatient Visits          2 months ago Essential hypertension   [facility] Juline Patch, MD   4 months ago Acute pain in female pelvis   [facility] Juline Patch, MD   8 months ago Essential hypertension   [facility] Juline Patch, MD   1 year ago Chronic obstructive pulmonary disease, unspecified COPD type ([place])   [facility] Juline Patch, MD   1 year ago Chronic obstructive pulmonary disease, unspecified COPD type [facility])   [facility] Juline Patch, MD      Future Appointments            In 3 months Juline Patch, MD [facility], [facility]

## 2022-03-01 ENCOUNTER — Other Ambulatory Visit: Payer: Self-pay | Admitting: Family Medicine

## 2022-03-01 DIAGNOSIS — J4520 Mild intermittent asthma, uncomplicated: Secondary | ICD-10-CM

## 2022-03-13 ENCOUNTER — Other Ambulatory Visit: Payer: Self-pay | Admitting: Family Medicine

## 2022-03-13 DIAGNOSIS — J301 Allergic rhinitis due to pollen: Secondary | ICD-10-CM

## 2022-04-07 ENCOUNTER — Other Ambulatory Visit: Payer: Self-pay | Admitting: Family Medicine

## 2022-04-07 DIAGNOSIS — J4520 Mild intermittent asthma, uncomplicated: Secondary | ICD-10-CM

## 2022-04-13 ENCOUNTER — Other Ambulatory Visit: Payer: Self-pay | Admitting: Family Medicine

## 2022-04-13 DIAGNOSIS — R454 Irritability and anger: Secondary | ICD-10-CM

## 2022-04-14 DIAGNOSIS — M810 Age-related osteoporosis without current pathological fracture: Secondary | ICD-10-CM | POA: Diagnosis not present

## 2022-04-14 DIAGNOSIS — E1169 Type 2 diabetes mellitus with other specified complication: Secondary | ICD-10-CM | POA: Diagnosis not present

## 2022-04-14 DIAGNOSIS — E785 Hyperlipidemia, unspecified: Secondary | ICD-10-CM | POA: Diagnosis not present

## 2022-04-14 LAB — HEMOGLOBIN A1C: Hemoglobin A1C: 7.2

## 2022-05-01 ENCOUNTER — Encounter (INDEPENDENT_AMBULATORY_CARE_PROVIDER_SITE_OTHER): Payer: Self-pay

## 2022-05-01 ENCOUNTER — Ambulatory Visit (INDEPENDENT_AMBULATORY_CARE_PROVIDER_SITE_OTHER): Payer: Medicare Other

## 2022-05-01 ENCOUNTER — Ambulatory Visit (INDEPENDENT_AMBULATORY_CARE_PROVIDER_SITE_OTHER): Payer: Medicare Other | Admitting: Vascular Surgery

## 2022-05-01 DIAGNOSIS — I7143 Infrarenal abdominal aortic aneurysm, without rupture: Secondary | ICD-10-CM

## 2022-05-13 ENCOUNTER — Ambulatory Visit (INDEPENDENT_AMBULATORY_CARE_PROVIDER_SITE_OTHER): Payer: Medicare Other | Admitting: Nurse Practitioner

## 2022-05-13 ENCOUNTER — Other Ambulatory Visit (INDEPENDENT_AMBULATORY_CARE_PROVIDER_SITE_OTHER): Payer: Medicare Other

## 2022-06-11 ENCOUNTER — Other Ambulatory Visit: Payer: Self-pay | Admitting: Family Medicine

## 2022-06-11 DIAGNOSIS — J4520 Mild intermittent asthma, uncomplicated: Secondary | ICD-10-CM

## 2022-06-12 ENCOUNTER — Other Ambulatory Visit: Payer: Self-pay | Admitting: Family Medicine

## 2022-06-12 DIAGNOSIS — J301 Allergic rhinitis due to pollen: Secondary | ICD-10-CM

## 2022-06-12 NOTE — Telephone Encounter (Signed)
Requested Prescriptions  Pending Prescriptions Disp Refills  . montelukast (SINGULAIR) 10 MG tablet [Pharmacy Med Name: Montelukast Sodium 10 MG Oral Tablet] 90 tablet 2    Sig: TAKE 1 TABLET BY MOUTH AT BEDTIME     Pulmonology:  Leukotriene Inhibitors Passed - 06/12/2022  8:51 AM      Passed - Valid encounter within last 12 months    Recent Outpatient Visits          5 months ago Essential hypertension   [facility] [place], Deanna C, MD   8 months ago Acute pain in female pelvis   [facility] [place], Deanna C, MD   11 months ago Essential hypertension   [facility] [place], Deanna C, MD   1 year ago Chronic obstructive pulmonary disease, unspecified COPD type ([place])   [facility] [place], Deanna C, MD   1 year ago Chronic obstructive pulmonary disease, unspecified COPD type [facility])   [facility], [place], MD      Future Appointments            In 1 week Juline Patch, MD [facility] at [facility], [facility]

## 2022-06-24 ENCOUNTER — Ambulatory Visit (INDEPENDENT_AMBULATORY_CARE_PROVIDER_SITE_OTHER): Payer: Medicare Other | Admitting: Family Medicine

## 2022-06-24 ENCOUNTER — Encounter: Payer: Self-pay | Admitting: Family Medicine

## 2022-06-24 VITALS — BP 118/70 | HR 72 | Ht 62.0 in | Wt 124.0 lb

## 2022-06-24 DIAGNOSIS — E119 Type 2 diabetes mellitus without complications: Secondary | ICD-10-CM | POA: Diagnosis not present

## 2022-06-24 DIAGNOSIS — E7849 Other hyperlipidemia: Secondary | ICD-10-CM | POA: Diagnosis not present

## 2022-06-24 DIAGNOSIS — J301 Allergic rhinitis due to pollen: Secondary | ICD-10-CM

## 2022-06-24 DIAGNOSIS — R42 Dizziness and giddiness: Secondary | ICD-10-CM | POA: Diagnosis not present

## 2022-06-24 DIAGNOSIS — Z23 Encounter for immunization: Secondary | ICD-10-CM | POA: Diagnosis not present

## 2022-06-24 DIAGNOSIS — R454 Irritability and anger: Secondary | ICD-10-CM

## 2022-06-24 DIAGNOSIS — N3281 Overactive bladder: Secondary | ICD-10-CM

## 2022-06-24 DIAGNOSIS — I10 Essential (primary) hypertension: Secondary | ICD-10-CM | POA: Diagnosis not present

## 2022-06-24 MED ORDER — FLUTICASONE PROPIONATE 50 MCG/ACT NA SUSP: 2.0000 | Freq: Every day | NASAL | 6 refills | Status: DC

## 2022-06-24 MED ORDER — SERTRALINE HCL 25 MG PO TABS: 25.0000 mg | ORAL_TABLET | Freq: Every day | ORAL | 1 refills | Status: DC

## 2022-06-24 MED ORDER — LOSARTAN POTASSIUM 100 MG PO TABS: 100.0000 mg | ORAL_TABLET | Freq: Every day | ORAL | 1 refills | Status: DC

## 2022-06-24 MED ORDER — AMLODIPINE BESYLATE 2.5 MG PO TABS: 2.5000 mg | ORAL_TABLET | Freq: Every day | ORAL | 1 refills | Status: DC

## 2022-06-24 MED ORDER — ATORVASTATIN CALCIUM 10 MG PO TABS: 10.0000 mg | ORAL_TABLET | Freq: Every day | ORAL | 1 refills | Status: DC

## 2022-06-24 NOTE — Progress Notes (Signed)
Date:  06/24/2022   Name:  Jill Guzman   DOB:  Apr 02, 1941   MRN:  [id number]   Chief Complaint: Flu Vaccine, pneumonia vaccine (Needs 35), Depression, Hypertension, Allergic Rhinitis , and Hyperlipidemia  Depression        Associated symptoms include no myalgias, no headaches and no suicidal ideas. Hypertension Pertinent negatives include no chest pain, headaches, neck pain, palpitations or shortness of breath.  Hyperlipidemia Pertinent negatives include no chest pain, myalgias or shortness of breath.  URI  This is a recurrent (for allergic rhinitis) problem. Associated symptoms include rhinorrhea. Pertinent negatives include no abdominal pain, chest pain, coughing, diarrhea, dysuria, ear pain, headaches, nausea, neck pain, rash, sore throat or wheezing.  Urinary Frequency  This is a chronic problem. The current episode started more than 1 year ago. The problem has been waxing and waning. There has been no fever. Associated symptoms include frequency and urgency. Pertinent negatives include no chills, hematuria or nausea. Associated symptoms comments: nocturia. The treatment provided moderate relief.    Lab Results  Component Value Date   NA 135 (A) 06/13/2021   NA 139 06/13/2021   K 4.8 06/13/2021   CO2 22 12/31/2020   GLUCOSE 126 (H) 12/31/2020   BUN 18 06/13/2021   CREATININE 0.80 10/21/2021   CALCIUM 10.8 (H) 12/31/2020   EGFR 89 12/31/2020   GFRNONAA >60 07/31/2020   Lab Results  Component Value Date   CHOL 175 06/13/2021   HDL 49 06/13/2021   LDLCALC 110 06/13/2021   TRIG 84 06/13/2021   CHOLHDL 2.9 03/29/2019   Lab Results  Component Value Date   TSH 1.69 06/13/2021   Lab Results  Component Value Date   HGBA1C 7.2 04/14/2022   Lab Results  Component Value Date   WBC 8.3 03/29/2019   HGB 14.1 03/29/2019   HCT 42.9 03/29/2019   MCV 85 03/29/2019   PLT 225 03/29/2019   Lab Results  Component Value Date   ALT 9 06/13/2021   AST 10 (A) 06/13/2021    ALKPHOS 51 12/31/2020   BILITOT 0.5 12/31/2020   No results found for: "25OHVITD2", "25OHVITD3", "VD25OH"   Review of Systems  Constitutional:  Negative for chills and fever.  HENT:  Positive for rhinorrhea and sinus pressure. Negative for drooling, ear discharge, ear pain, sore throat and tinnitus.   Respiratory:  Negative for cough, shortness of breath and wheezing.   Cardiovascular:  Negative for chest pain, palpitations and leg swelling.  Gastrointestinal:  Negative for abdominal pain, blood in stool, constipation, diarrhea and nausea.  Endocrine: Negative for polydipsia.  Genitourinary:  Positive for frequency and urgency. Negative for dysuria and hematuria.  Musculoskeletal:  Negative for back pain, myalgias and neck pain.  Skin:  Negative for rash.  Allergic/Immunologic: Negative for environmental allergies.  Neurological:  Positive for light-headedness. Negative for dizziness, speech difficulty, weakness, numbness and headaches.  Hematological:  Does not bruise/bleed easily.  Psychiatric/Behavioral:  Positive for depression. Negative for suicidal ideas. The patient is not nervous/anxious.     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/31/2014  Clinical depression 12/31/2014   AB (asthmatic bronchitis) 12/31/2014   Routine general medical examination at a health care facility 12/31/2014   Diabetes ([place]) 12/31/2014   BP (high blood pressure) 12/31/2014   Osteopenia 12/31/2014     Allergies  Allergen Reactions   Penicillins Itching   Sulfa Antibiotics Itching    Past Surgical History:  Procedure Laterality Date   ABDOMINAL HYSTERECTOMY     BREAST EXCISIONAL BIOPSY Left 1976   neg   CATARACT EXTRACTION W/ INTRAOCULAR LENS  IMPLANT, BILATERAL     COLONOSCOPY  2014   normal   cyst on bladder removed     cyst removed breast Left    KNEE ARTHROSCOPY WITH MEDIAL MENISECTOMY Left 10/20/2019   Procedure: KNEE ARTHROSCOPY WITH PARTIAL MEDIAL MENISECTOMY;  Surgeon: Leim Fabry, MD;  Location: [facility];  Service: Orthopedics;  Laterality: Left;  DIABETIC - oral meds   PARTIAL HYSTERECTOMY     TUBAL LIGATION      Social History   Tobacco Use   Smoking status: Former    Packs/day: 2.00    Years: 10.00    Total pack years: 20.00    Types: Cigarettes    Quit date: 05/11/1999    Years since quitting: 23.1   Smokeless tobacco: Never   Tobacco comments:    Smoking cessation materials not required  Vaping Use   Vaping Use: Never used  Substance Use Topics   Alcohol use: No    Alcohol/week: 0.0 standard drinks of alcohol   Drug use: No     Medication list has been reviewed and updated.  Current Meds  Medication Sig   Acetaminophen (TYLENOL PO) Take by mouth as needed.   albuterol (VENTOLIN HFA) 108 (90 Base) MCG/ACT inhaler INHALE 1 PUFF BY MOUTH 4 TIMES DAILY   amLODipine (NORVASC) 2.5 MG tablet Take 1 tablet (2.5 mg total) by mouth daily.   atorvastatin (LIPITOR) 10 MG tablet Take 1 tablet (10 mg total) by mouth daily.   Blood Glucose Monitoring Suppl (ACCU-CHEK GUIDE ME) w/Device KIT Test once daily   budesonide-formoterol (SYMBICORT) 80-4.5 MCG/ACT inhaler Inhale 2 puffs into the lungs 2 (two) times daily.   Cholecalciferol (VITAMIN D) 50 MCG (2000 UT) CAPS Take by mouth.   fluticasone (FLONASE) 50 MCG/ACT nasal spray Place 2 sprays into both nostrils daily.   Ibuprofen (ADVIL PO) Take by mouth as needed.   losartan (COZAAR) 100 MG  tablet Take 1 tablet (100 mg total) by mouth daily.   metFORMIN (GLUCOPHAGE) 500 MG tablet Take 1 tablet (500 mg total) by mouth 2 (two) times daily. (Patient taking differently: Take 500 mg by mouth 2 (two) times daily with a meal. Pt taking 500 mg in AM and 514m at night- endocrinology)   Omega 3 1000 MG CAPS Take 1 capsule (1,000 mg total) by mouth daily.   sertraline (ZOLOFT) 25 MG tablet Take 1 tablet by mouth once daily   spironolactone (ALDACTONE) 50 MG tablet Take 1 tablet by mouth daily.   [DISCONTINUED] oxybutynin (DITROPAN) 5 MG tablet Take 1 tablet (5 mg total) by mouth 2 (two) times daily.       06/24/2022    8:57 AM 12/23/2021    8:25 AM 10/09/2021    4:05 PM 06/24/2021    9:18 AM  GAD 7 : Generalized Anxiety Score  Nervous, Anxious, on Edge 0 1 0 2  Control/stop worrying 0 1 0 2  Worry too much - different things 0 1 0 2  Trouble relaxing 0 1 1 0  Restless 0 0 0 0  Easily annoyed or irritable 0 2 0 2  Afraid - awful might happen 0 0 0 0  Total GAD 7 Score 0 '6 1 8  ' Anxiety Difficulty Not difficult at all Somewhat difficult  Not difficult at all       06/24/2022    8:57 AM 12/23/2021    8:24 AM 10/20/2021    8:54 AM  Depression screen PHQ 2/9  Decreased Interest 0 0 0  Down, Depressed, Hopeless 1 2 0  PHQ - 2 Score 1 2 0  Altered sleeping 0 1 0  Tired, decreased energy 0 0 0  Change in appetite 0 0 0  Feeling bad or failure about yourself  0 1 0  Trouble concentrating 0 0 0  Moving slowly or fidgety/restless 0 0 0  Suicidal thoughts 0 0 0  PHQ-9 Score 1 4 0  Difficult doing work/chores Not difficult at all Somewhat difficult Not difficult at all    BP Readings from Last 3 Encounters:  06/24/22 118/70  01/27/22 116/67  12/23/21 136/82    Physical Exam Vitals and nursing note reviewed. Exam conducted with a chaperone present.  Constitutional:      General: She is not in acute distress.    Appearance: She is not diaphoretic.  HENT:     Head:  Normocephalic and atraumatic.     Right Ear: Tympanic membrane and external ear normal.     Left Ear: Tympanic membrane and external ear normal.     Nose: Nose normal. No congestion or rhinorrhea.  Eyes:     General:        Right eye: No discharge.        Left eye: No discharge.     Conjunctiva/sclera: Conjunctivae normal.     Pupils: Pupils are equal, round, and reactive to light.  Neck:     Thyroid: No thyromegaly.     Vascular: No JVD.  Cardiovascular:     Rate and Rhythm: Normal rate and regular rhythm.     Pulses: Normal pulses.     Heart sounds: Normal heart sounds, S1 normal and S2 normal. No murmur heard.    No systolic murmur is present.     No diastolic murmur is present.     No friction rub. No gallop.  Pulmonary:     Effort: Pulmonary effort is normal.     Breath sounds: Normal breath sounds. No wheezing, rhonchi or rales.  Abdominal:     General: Bowel sounds are normal.     Palpations: Abdomen is soft. There is no mass.     Tenderness: There is no abdominal tenderness. There is no guarding.  Musculoskeletal:        General: Normal range of motion.     Cervical back: Normal range of motion and neck supple.  Lymphadenopathy:     Cervical: No cervical adenopathy.  Skin:    General: Skin is warm and dry.  Neurological:     Mental Status: She is alert.     Deep Tendon Reflexes: Reflexes are normal and symmetric.     Wt Readings from Last 3 Encounters:  06/24/22 124 lb (56.2 kg)  01/27/22 125 lb (56.7 kg)  12/23/21 127 lb (57.6 kg)    BP 118/70   Pulse 72   Ht '5\' 2"'  (1.575 m)   Wt 124 lb (56.2 kg)   BMI 22.68 kg/m   Assessment and Plan:  1. Essential hypertension Chronic.  Controlled.  Stable.  Blood pressure 118/70.  Asymptomatic.  Tolerating medications well.  Continue amlodipine 2.5 mg and losartan 100 mg once a day.  Will check CMP for electrolytes and GFR.  We will recheck patient in 6 months. - amLODipine (NORVASC) 2.5 MG tablet; Take 1 tablet  (2.5 mg total) by mouth daily.  Dispense: 90 tablet; Refill: 1 - losartan (COZAAR) 100 MG tablet; Take 1 tablet (100 mg total) by mouth daily.  Dispense: 90 tablet; Refill: 1 - Comprehensive Metabolic Panel (CMET)  2. Familial multiple lipoprotein-type hyperlipidemia Chronic.  Controlled.  Stable.  Continue atorvastatin 10 mg once a day.  Will check lipid panel for current level of control. - atorvastatin (LIPITOR) 10 MG tablet; Take 1 tablet (10 mg total) by mouth daily.  Dispense: 90 tablet; Refill: 1 - Lipid Panel With LDL/HDL Ratio  3. Type 2 diabetes mellitus without complication, without long-term current use of insulin (HCC) Chronic.  Followed by endocrinology.  Patient will obtain a microalbuminuria with creatinine for current evaluation of diabetic nephropathy concern. - Lipid Panel With LDL/HDL Ratio - Comprehensive Metabolic Panel (CMET) - Urine microalbumin-creatinine with uACR  4. 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  Controlled.  Stable.  Some rhinorrhea and nasal pressure.  Have reassured patient that we have freeze frost just on the horizon and to continue her Flonase. - fluticasone (FLONASE) 50 MCG/ACT nasal spray; Place 2 sprays into both nostrils daily.  Dispense: 16 g; Refill: 6  5. Overactive bladder Chronic.  Persistent.  Tolerable.  Patient does not wish to take any other medications and is okay at current level of control.  6. Irritability Chronic.  Controlled.  Stable.  PHQ is 1 GAD score is 0 continue sertraline 25 mg once a day. - sertraline (ZOLOFT) 25 MG tablet; Take 1 tablet (25 mg total) by mouth daily.  Dispense: 90 tablet; Refill: 1  7. Dizziness Chronic.  Episodic.  Both orthostatic lightheadedness when patient bends over is probably secondary to blood pressure control and vertigo when she turns over in bed but this is probably secondary to allergic rhinitis.  8. Need for pneumococcal 20-valent conjugate vaccination Discussed and  administered. - Pneumococcal conjugate vaccine 20-valent (Prevnar 20)    Otilio Miu, MD

## 2022-06-25 LAB — COMPREHENSIVE METABOLIC PANEL
ALT: 11 IU/L (ref 0–32)
AST: 13 IU/L (ref 0–40)
Albumin/Globulin Ratio: 2.2 (ref 1.2–2.2)
Albumin: 4.8 g/dL (ref 3.8–4.8)
Alkaline Phosphatase: 61 IU/L (ref 44–121)
BUN/Creatinine Ratio: 15 (ref 12–28)
BUN: 12 mg/dL (ref 8–27)
Bilirubin Total: 0.5 mg/dL (ref 0.0–1.2)
CO2: 23 mmol/L (ref 20–29)
Calcium: 10.5 mg/dL — ABNORMAL HIGH (ref 8.7–10.3)
Chloride: 101 mmol/L (ref 96–106)
Creatinine, Ser: 0.8 mg/dL (ref 0.57–1.00)
Globulin, Total: 2.2 g/dL (ref 1.5–4.5)
Glucose: 162 mg/dL — ABNORMAL HIGH (ref 70–99)
Potassium: 4.4 mmol/L (ref 3.5–5.2)
Sodium: 140 mmol/L (ref 134–144)
Total Protein: 7 g/dL (ref 6.0–8.5)
eGFR: 75 mL/min/{1.73_m2} (ref >59)

## 2022-06-25 LAB — LIPID PANEL WITH LDL/HDL RATIO
Cholesterol, Total: 191 mg/dL (ref 100–199)
HDL: 55 mg/dL (ref >39)
LDL Chol Calc (NIH): 115 mg/dL — ABNORMAL HIGH (ref 0–99)
LDL/HDL Ratio: 2.1 ratio (ref 0.0–3.2)
Triglycerides: 120 mg/dL (ref 0–149)
VLDL Cholesterol Cal: 21 mg/dL (ref 5–40)

## 2022-06-25 LAB — MICROALBUMIN / CREATININE URINE RATIO
Creatinine, Urine: 87.8 mg/dL
Microalb/Creat Ratio: 6 mg/g creat (ref 0–29)
Microalbumin, Urine: 4.9 ug/mL

## 2022-06-29 ENCOUNTER — Ambulatory Visit: Payer: Medicare Other | Admitting: Family Medicine

## 2022-06-29 ENCOUNTER — Ambulatory Visit: Payer: Self-pay

## 2022-06-29 DIAGNOSIS — K8689 Other specified diseases of pancreas: Secondary | ICD-10-CM | POA: Diagnosis not present

## 2022-06-29 DIAGNOSIS — R471 Dysarthria and anarthria: Secondary | ICD-10-CM | POA: Diagnosis not present

## 2022-06-29 DIAGNOSIS — Z882 Allergy status to sulfonamides status: Secondary | ICD-10-CM | POA: Diagnosis not present

## 2022-06-29 DIAGNOSIS — J323 Chronic sphenoidal sinusitis: Secondary | ICD-10-CM | POA: Diagnosis not present

## 2022-06-29 DIAGNOSIS — I2510 Atherosclerotic heart disease of native coronary artery without angina pectoris: Secondary | ICD-10-CM | POA: Diagnosis not present

## 2022-06-29 DIAGNOSIS — D6859 Other primary thrombophilia: Secondary | ICD-10-CM | POA: Diagnosis not present

## 2022-06-29 DIAGNOSIS — I700 Atherosclerosis of aorta: Secondary | ICD-10-CM | POA: Diagnosis not present

## 2022-06-29 DIAGNOSIS — Z66 Do not resuscitate: Secondary | ICD-10-CM | POA: Diagnosis not present

## 2022-06-29 DIAGNOSIS — Z79899 Other long term (current) drug therapy: Secondary | ICD-10-CM | POA: Diagnosis not present

## 2022-06-29 DIAGNOSIS — Z923 Personal history of irradiation: Secondary | ICD-10-CM | POA: Diagnosis not present

## 2022-06-29 DIAGNOSIS — G939 Disorder of brain, unspecified: Secondary | ICD-10-CM | POA: Diagnosis not present

## 2022-06-29 DIAGNOSIS — G936 Cerebral edema: Secondary | ICD-10-CM | POA: Diagnosis not present

## 2022-06-29 DIAGNOSIS — R29818 Other symptoms and signs involving the nervous system: Secondary | ICD-10-CM | POA: Diagnosis not present

## 2022-06-29 DIAGNOSIS — Z87891 Personal history of nicotine dependence: Secondary | ICD-10-CM | POA: Diagnosis not present

## 2022-06-29 DIAGNOSIS — R4789 Other speech disturbances: Secondary | ICD-10-CM | POA: Diagnosis not present

## 2022-06-29 DIAGNOSIS — R4781 Slurred speech: Secondary | ICD-10-CM | POA: Diagnosis not present

## 2022-06-29 DIAGNOSIS — E1165 Type 2 diabetes mellitus with hyperglycemia: Secondary | ICD-10-CM | POA: Diagnosis not present

## 2022-06-29 DIAGNOSIS — Z7984 Long term (current) use of oral hypoglycemic drugs: Secondary | ICD-10-CM | POA: Diagnosis not present

## 2022-06-29 DIAGNOSIS — G9389 Other specified disorders of brain: Secondary | ICD-10-CM | POA: Diagnosis not present

## 2022-06-29 DIAGNOSIS — Z880 Allergy status to penicillin: Secondary | ICD-10-CM | POA: Diagnosis not present

## 2022-06-29 DIAGNOSIS — I10 Essential (primary) hypertension: Secondary | ICD-10-CM | POA: Diagnosis not present

## 2022-06-29 DIAGNOSIS — C7931 Secondary malignant neoplasm of brain: Secondary | ICD-10-CM | POA: Diagnosis not present

## 2022-06-29 DIAGNOSIS — C3412 Malignant neoplasm of upper lobe, left bronchus or lung: Secondary | ICD-10-CM | POA: Diagnosis not present

## 2022-06-29 DIAGNOSIS — R519 Headache, unspecified: Secondary | ICD-10-CM | POA: Diagnosis not present

## 2022-06-29 DIAGNOSIS — C3490 Malignant neoplasm of unspecified part of unspecified bronchus or lung: Secondary | ICD-10-CM | POA: Diagnosis not present

## 2022-06-29 DIAGNOSIS — R918 Other nonspecific abnormal finding of lung field: Secondary | ICD-10-CM | POA: Diagnosis not present

## 2022-06-29 DIAGNOSIS — I7143 Infrarenal abdominal aortic aneurysm, without rupture: Secondary | ICD-10-CM | POA: Diagnosis not present

## 2022-06-29 DIAGNOSIS — R911 Solitary pulmonary nodule: Secondary | ICD-10-CM | POA: Diagnosis not present

## 2022-06-29 DIAGNOSIS — R531 Weakness: Secondary | ICD-10-CM | POA: Diagnosis not present

## 2022-06-29 DIAGNOSIS — Z801 Family history of malignant neoplasm of trachea, bronchus and lung: Secondary | ICD-10-CM | POA: Diagnosis not present

## 2022-06-29 DIAGNOSIS — R4701 Aphasia: Secondary | ICD-10-CM | POA: Diagnosis not present

## 2022-06-29 DIAGNOSIS — R29810 Facial weakness: Secondary | ICD-10-CM | POA: Diagnosis not present

## 2022-06-29 DIAGNOSIS — G510 Bell's palsy: Secondary | ICD-10-CM | POA: Diagnosis not present

## 2022-06-29 DIAGNOSIS — C801 Malignant (primary) neoplasm, unspecified: Secondary | ICD-10-CM | POA: Diagnosis not present

## 2022-06-29 DIAGNOSIS — E7849 Other hyperlipidemia: Secondary | ICD-10-CM | POA: Diagnosis not present

## 2022-06-29 DIAGNOSIS — Z794 Long term (current) use of insulin: Secondary | ICD-10-CM | POA: Diagnosis not present

## 2022-06-29 DIAGNOSIS — R5383 Other fatigue: Secondary | ICD-10-CM | POA: Diagnosis not present

## 2022-06-29 DIAGNOSIS — Z7952 Long term (current) use of systemic steroids: Secondary | ICD-10-CM | POA: Diagnosis not present

## 2022-06-29 NOTE — Telephone Encounter (Signed)
  Chief Complaint: Neurological s/s Symptoms: see below Frequency: Since OV of 10/18 Pertinent Negatives: Patient denies  Disposition: [] ED /[] Urgent Care (no appt availability in office) / [x] Appointment(In office/virtual)/ []  [facility]/ [] Home Care/ [] Refused Recommended Disposition /[] [facility]/ []  Follow-up with PCP Additional Notes: Call from daughter. Helene Kelp, not on DPR. Daughter states that pt is having weakness, fatigue, cannot write properly, memory issues, difficulty speaking, bot arms hurt.  Daughter states that this started before last OV on 10/18, but has gotten much worse.  Per daughter , pt thinks this is a stroke. Daughter is wondering if this has anything to do with 2 vaccines being given at [facility].  Called pt 2 times. She did not pick up.   Reason for Disposition  [1] Weakness of the face, arm / hand, or leg / foot on one side of the body AND [2] gradual onset (e.g., days to weeks) AND [3] present now  Answer Assessment - Initial Assessment Questions 1. SYMPTOM: "What is the main symptom you are concerned about?" (e.g., weakness, numbness)    10/18 2. ONSET: "When did this start?" (minutes, hours, days; while sleeping)     *No Answer* 3. LAST NORMAL: "When was the last time you (the patient) were normal (no symptoms)?"     days 4. PATTERN "Does this come and go, or has it been constant since it started?"  "Is it present now?"     *No Answer* 5. CARDIAC SYMPTOMS: "Have you had any of the following symptoms: chest pain, difficulty breathing, palpitations?"     *No Answer* 6. NEUROLOGIC SYMPTOMS: "Have you had any of the following symptoms: headache, dizziness, vision loss, double vision, changes in speech, unsteady on your feet?"     *No Answer* 7. OTHER SYMPTOMS: "Do you have any other symptoms?"     *No Answer* 8. PREGNANCY: "Is there any chance you are pregnant?" "When was your last menstrual period?"     *No Answer*  Protocols used:  Neurologic Deficit-A-AH

## 2022-06-30 DIAGNOSIS — G9389 Other specified disorders of brain: Secondary | ICD-10-CM | POA: Insufficient documentation

## 2022-07-03 ENCOUNTER — Telehealth: Payer: Self-pay

## 2022-07-03 ENCOUNTER — Telehealth: Payer: Self-pay | Admitting: Family Medicine

## 2022-07-03 ENCOUNTER — Other Ambulatory Visit: Payer: Self-pay

## 2022-07-03 DIAGNOSIS — J449 Chronic obstructive pulmonary disease, unspecified: Secondary | ICD-10-CM

## 2022-07-03 MED ORDER — BUDESONIDE-FORMOTEROL FUMARATE 80-4.5 MCG/ACT IN AERO: 2.0000 | INHALATION_SPRAY | Freq: Two times a day (BID) | RESPIRATORY_TRACT | 3 refills | Status: DC

## 2022-07-03 NOTE — Telephone Encounter (Signed)
Copied from [place] [id number] [id number]. Topic: General - Inquiry >> Jul 03, 2022  9:20 AM Penni Bombard wrote: Reason for CRM: Daughter called saying her mom had to go to the ER [facility] Monday for possible stroke.  She was diagnosed with brain and lung cancer.  She is home with steroids.  She ask for the records to be sent to [facility]. They told her that Dr. [name] would have to ask for the records.  CB@  [phone]

## 2022-07-03 NOTE — Telephone Encounter (Signed)
Spoke to daughter and pt- went over and updated med list. Gave number to endo/ Honor Junes to them to call and ask for extra lancets and supplies. Pt is starting radiation the first of week. Pt was taken off sertraline by doctor that saw her. She needs something for depression and anxiety. I recommended Mirtazepine, they will run it past the oncologist or whichever doctor stopped her sertraline

## 2022-07-06 ENCOUNTER — Encounter (INDEPENDENT_AMBULATORY_CARE_PROVIDER_SITE_OTHER): Payer: Self-pay

## 2022-07-09 DIAGNOSIS — E785 Hyperlipidemia, unspecified: Secondary | ICD-10-CM | POA: Diagnosis not present

## 2022-07-09 DIAGNOSIS — Z510 Encounter for antineoplastic radiation therapy: Secondary | ICD-10-CM | POA: Diagnosis not present

## 2022-07-09 DIAGNOSIS — Z87891 Personal history of nicotine dependence: Secondary | ICD-10-CM | POA: Diagnosis not present

## 2022-07-09 DIAGNOSIS — C7931 Secondary malignant neoplasm of brain: Secondary | ICD-10-CM | POA: Diagnosis not present

## 2022-07-09 DIAGNOSIS — I10 Essential (primary) hypertension: Secondary | ICD-10-CM | POA: Diagnosis not present

## 2022-07-09 DIAGNOSIS — C7800 Secondary malignant neoplasm of unspecified lung: Secondary | ICD-10-CM | POA: Diagnosis not present

## 2022-07-09 DIAGNOSIS — Z794 Long term (current) use of insulin: Secondary | ICD-10-CM | POA: Diagnosis not present

## 2022-07-09 DIAGNOSIS — Z7984 Long term (current) use of oral hypoglycemic drugs: Secondary | ICD-10-CM | POA: Diagnosis not present

## 2022-07-09 DIAGNOSIS — C3412 Malignant neoplasm of upper lobe, left bronchus or lung: Secondary | ICD-10-CM | POA: Diagnosis not present

## 2022-07-09 DIAGNOSIS — Z882 Allergy status to sulfonamides status: Secondary | ICD-10-CM | POA: Diagnosis not present

## 2022-07-09 DIAGNOSIS — G9389 Other specified disorders of brain: Secondary | ICD-10-CM | POA: Diagnosis not present

## 2022-07-09 DIAGNOSIS — Z79899 Other long term (current) drug therapy: Secondary | ICD-10-CM | POA: Diagnosis not present

## 2022-07-09 DIAGNOSIS — E119 Type 2 diabetes mellitus without complications: Secondary | ICD-10-CM | POA: Diagnosis not present

## 2022-07-09 DIAGNOSIS — Z801 Family history of malignant neoplasm of trachea, bronchus and lung: Secondary | ICD-10-CM | POA: Diagnosis not present

## 2022-07-09 DIAGNOSIS — G936 Cerebral edema: Secondary | ICD-10-CM | POA: Diagnosis not present

## 2022-07-10 DIAGNOSIS — E1169 Type 2 diabetes mellitus with other specified complication: Secondary | ICD-10-CM | POA: Diagnosis not present

## 2022-07-10 DIAGNOSIS — M810 Age-related osteoporosis without current pathological fracture: Secondary | ICD-10-CM | POA: Diagnosis not present

## 2022-07-10 DIAGNOSIS — E119 Type 2 diabetes mellitus without complications: Secondary | ICD-10-CM | POA: Diagnosis not present

## 2022-07-10 DIAGNOSIS — E785 Hyperlipidemia, unspecified: Secondary | ICD-10-CM | POA: Diagnosis not present

## 2022-07-14 DIAGNOSIS — E119 Type 2 diabetes mellitus without complications: Secondary | ICD-10-CM | POA: Diagnosis not present

## 2022-07-15 DIAGNOSIS — Z882 Allergy status to sulfonamides status: Secondary | ICD-10-CM | POA: Diagnosis not present

## 2022-07-15 DIAGNOSIS — E119 Type 2 diabetes mellitus without complications: Secondary | ICD-10-CM | POA: Diagnosis not present

## 2022-07-15 DIAGNOSIS — E7849 Other hyperlipidemia: Secondary | ICD-10-CM | POA: Diagnosis not present

## 2022-07-15 DIAGNOSIS — C7931 Secondary malignant neoplasm of brain: Secondary | ICD-10-CM | POA: Diagnosis not present

## 2022-07-15 DIAGNOSIS — I10 Essential (primary) hypertension: Secondary | ICD-10-CM | POA: Diagnosis not present

## 2022-07-15 DIAGNOSIS — R911 Solitary pulmonary nodule: Secondary | ICD-10-CM | POA: Diagnosis not present

## 2022-07-15 DIAGNOSIS — R918 Other nonspecific abnormal finding of lung field: Secondary | ICD-10-CM | POA: Diagnosis not present

## 2022-07-15 DIAGNOSIS — C3412 Malignant neoplasm of upper lobe, left bronchus or lung: Secondary | ICD-10-CM | POA: Diagnosis not present

## 2022-07-15 DIAGNOSIS — J939 Pneumothorax, unspecified: Secondary | ICD-10-CM | POA: Diagnosis not present

## 2022-07-20 DIAGNOSIS — G9389 Other specified disorders of brain: Secondary | ICD-10-CM | POA: Diagnosis not present

## 2022-07-20 DIAGNOSIS — C7951 Secondary malignant neoplasm of bone: Secondary | ICD-10-CM | POA: Diagnosis not present

## 2022-07-20 DIAGNOSIS — C3412 Malignant neoplasm of upper lobe, left bronchus or lung: Secondary | ICD-10-CM | POA: Diagnosis not present

## 2022-07-20 DIAGNOSIS — Z7189 Other specified counseling: Secondary | ICD-10-CM | POA: Diagnosis not present

## 2022-07-20 DIAGNOSIS — G893 Neoplasm related pain (acute) (chronic): Secondary | ICD-10-CM | POA: Diagnosis not present

## 2022-07-20 DIAGNOSIS — Z801 Family history of malignant neoplasm of trachea, bronchus and lung: Secondary | ICD-10-CM | POA: Diagnosis not present

## 2022-07-20 DIAGNOSIS — E118 Type 2 diabetes mellitus with unspecified complications: Secondary | ICD-10-CM | POA: Diagnosis not present

## 2022-07-20 DIAGNOSIS — G936 Cerebral edema: Secondary | ICD-10-CM | POA: Diagnosis not present

## 2022-07-20 DIAGNOSIS — Z882 Allergy status to sulfonamides status: Secondary | ICD-10-CM | POA: Diagnosis not present

## 2022-07-20 DIAGNOSIS — C7931 Secondary malignant neoplasm of brain: Secondary | ICD-10-CM | POA: Diagnosis not present

## 2022-07-20 DIAGNOSIS — F32A Depression, unspecified: Secondary | ICD-10-CM | POA: Diagnosis not present

## 2022-07-20 DIAGNOSIS — Z79899 Other long term (current) drug therapy: Secondary | ICD-10-CM | POA: Diagnosis not present

## 2022-07-20 DIAGNOSIS — R530 Neoplastic (malignant) related fatigue: Secondary | ICD-10-CM | POA: Diagnosis not present

## 2022-07-20 DIAGNOSIS — Z794 Long term (current) use of insulin: Secondary | ICD-10-CM | POA: Diagnosis not present

## 2022-07-20 DIAGNOSIS — Z510 Encounter for antineoplastic radiation therapy: Secondary | ICD-10-CM | POA: Diagnosis not present

## 2022-07-20 DIAGNOSIS — Z515 Encounter for palliative care: Secondary | ICD-10-CM | POA: Diagnosis not present

## 2022-07-20 DIAGNOSIS — I10 Essential (primary) hypertension: Secondary | ICD-10-CM | POA: Diagnosis not present

## 2022-07-20 DIAGNOSIS — Z87891 Personal history of nicotine dependence: Secondary | ICD-10-CM | POA: Diagnosis not present

## 2022-07-20 DIAGNOSIS — R4789 Other speech disturbances: Secondary | ICD-10-CM | POA: Diagnosis not present

## 2022-07-20 DIAGNOSIS — Z7984 Long term (current) use of oral hypoglycemic drugs: Secondary | ICD-10-CM | POA: Diagnosis not present

## 2022-07-20 DIAGNOSIS — C3490 Malignant neoplasm of unspecified part of unspecified bronchus or lung: Secondary | ICD-10-CM | POA: Diagnosis not present

## 2022-07-20 DIAGNOSIS — C7800 Secondary malignant neoplasm of unspecified lung: Secondary | ICD-10-CM | POA: Diagnosis not present

## 2022-07-20 DIAGNOSIS — E785 Hyperlipidemia, unspecified: Secondary | ICD-10-CM | POA: Diagnosis not present

## 2022-07-20 DIAGNOSIS — E119 Type 2 diabetes mellitus without complications: Secondary | ICD-10-CM | POA: Diagnosis not present

## 2022-07-22 DIAGNOSIS — Z801 Family history of malignant neoplasm of trachea, bronchus and lung: Secondary | ICD-10-CM | POA: Diagnosis not present

## 2022-07-22 DIAGNOSIS — G9389 Other specified disorders of brain: Secondary | ICD-10-CM | POA: Diagnosis not present

## 2022-07-22 DIAGNOSIS — Z7984 Long term (current) use of oral hypoglycemic drugs: Secondary | ICD-10-CM | POA: Diagnosis not present

## 2022-07-22 DIAGNOSIS — E119 Type 2 diabetes mellitus without complications: Secondary | ICD-10-CM | POA: Diagnosis not present

## 2022-07-22 DIAGNOSIS — Z79899 Other long term (current) drug therapy: Secondary | ICD-10-CM | POA: Diagnosis not present

## 2022-07-22 DIAGNOSIS — I10 Essential (primary) hypertension: Secondary | ICD-10-CM | POA: Diagnosis not present

## 2022-07-22 DIAGNOSIS — E785 Hyperlipidemia, unspecified: Secondary | ICD-10-CM | POA: Diagnosis not present

## 2022-07-22 DIAGNOSIS — G936 Cerebral edema: Secondary | ICD-10-CM | POA: Diagnosis not present

## 2022-07-22 DIAGNOSIS — Z87891 Personal history of nicotine dependence: Secondary | ICD-10-CM | POA: Diagnosis not present

## 2022-07-22 DIAGNOSIS — Z794 Long term (current) use of insulin: Secondary | ICD-10-CM | POA: Diagnosis not present

## 2022-07-22 DIAGNOSIS — C7800 Secondary malignant neoplasm of unspecified lung: Secondary | ICD-10-CM | POA: Diagnosis not present

## 2022-07-22 DIAGNOSIS — Z510 Encounter for antineoplastic radiation therapy: Secondary | ICD-10-CM | POA: Diagnosis not present

## 2022-07-22 DIAGNOSIS — Z882 Allergy status to sulfonamides status: Secondary | ICD-10-CM | POA: Diagnosis not present

## 2022-07-24 DIAGNOSIS — Z794 Long term (current) use of insulin: Secondary | ICD-10-CM | POA: Diagnosis not present

## 2022-07-24 DIAGNOSIS — C3412 Malignant neoplasm of upper lobe, left bronchus or lung: Secondary | ICD-10-CM | POA: Diagnosis not present

## 2022-07-24 DIAGNOSIS — E119 Type 2 diabetes mellitus without complications: Secondary | ICD-10-CM | POA: Diagnosis not present

## 2022-07-24 DIAGNOSIS — Z79899 Other long term (current) drug therapy: Secondary | ICD-10-CM | POA: Diagnosis not present

## 2022-07-24 DIAGNOSIS — E785 Hyperlipidemia, unspecified: Secondary | ICD-10-CM | POA: Diagnosis not present

## 2022-07-24 DIAGNOSIS — G936 Cerebral edema: Secondary | ICD-10-CM | POA: Diagnosis not present

## 2022-07-24 DIAGNOSIS — Z87891 Personal history of nicotine dependence: Secondary | ICD-10-CM | POA: Diagnosis not present

## 2022-07-24 DIAGNOSIS — Z510 Encounter for antineoplastic radiation therapy: Secondary | ICD-10-CM | POA: Diagnosis not present

## 2022-07-24 DIAGNOSIS — G9389 Other specified disorders of brain: Secondary | ICD-10-CM | POA: Diagnosis not present

## 2022-07-24 DIAGNOSIS — Z882 Allergy status to sulfonamides status: Secondary | ICD-10-CM | POA: Diagnosis not present

## 2022-07-24 DIAGNOSIS — C7800 Secondary malignant neoplasm of unspecified lung: Secondary | ICD-10-CM | POA: Diagnosis not present

## 2022-07-24 DIAGNOSIS — I10 Essential (primary) hypertension: Secondary | ICD-10-CM | POA: Diagnosis not present

## 2022-07-24 DIAGNOSIS — Z7984 Long term (current) use of oral hypoglycemic drugs: Secondary | ICD-10-CM | POA: Diagnosis not present

## 2022-07-24 DIAGNOSIS — C7931 Secondary malignant neoplasm of brain: Secondary | ICD-10-CM | POA: Diagnosis not present

## 2022-07-24 DIAGNOSIS — Z801 Family history of malignant neoplasm of trachea, bronchus and lung: Secondary | ICD-10-CM | POA: Diagnosis not present

## 2022-07-27 DIAGNOSIS — C3490 Malignant neoplasm of unspecified part of unspecified bronchus or lung: Secondary | ICD-10-CM | POA: Diagnosis not present

## 2022-07-27 DIAGNOSIS — C7931 Secondary malignant neoplasm of brain: Secondary | ICD-10-CM | POA: Diagnosis not present

## 2022-07-28 ENCOUNTER — Telehealth: Payer: Self-pay | Admitting: Family Medicine

## 2022-07-28 ENCOUNTER — Other Ambulatory Visit: Payer: Self-pay

## 2022-07-28 DIAGNOSIS — F329 Major depressive disorder, single episode, unspecified: Secondary | ICD-10-CM

## 2022-07-28 MED ORDER — SERTRALINE HCL 25 MG PO TABS: 25.0000 mg | ORAL_TABLET | Freq: Every day | ORAL | 0 refills | Status: DC

## 2022-07-28 NOTE — Telephone Encounter (Signed)
Copied from [place] [id number]. Topic: General - Inquiry >> Jul 28, 2022  7:53 AM Marcellus Scott wrote: Reason for CRM: Pt is requesting a call back from [facility] Flattery as soon as possible. Pt declined to provide any details or speak with another nurse.  Please advise.

## 2022-08-02 ENCOUNTER — Other Ambulatory Visit: Payer: Self-pay | Admitting: Family Medicine

## 2022-08-02 DIAGNOSIS — J4520 Mild intermittent asthma, uncomplicated: Secondary | ICD-10-CM

## 2022-08-04 ENCOUNTER — Encounter (INDEPENDENT_AMBULATORY_CARE_PROVIDER_SITE_OTHER): Payer: Self-pay | Admitting: Vascular Surgery

## 2022-08-04 ENCOUNTER — Other Ambulatory Visit (INDEPENDENT_AMBULATORY_CARE_PROVIDER_SITE_OTHER): Payer: Medicare Other

## 2022-08-04 ENCOUNTER — Ambulatory Visit (INDEPENDENT_AMBULATORY_CARE_PROVIDER_SITE_OTHER): Payer: Medicare Other

## 2022-08-04 ENCOUNTER — Other Ambulatory Visit (INDEPENDENT_AMBULATORY_CARE_PROVIDER_SITE_OTHER): Payer: Self-pay | Admitting: Vascular Surgery

## 2022-08-04 ENCOUNTER — Ambulatory Visit (INDEPENDENT_AMBULATORY_CARE_PROVIDER_SITE_OTHER): Payer: Medicare Other | Admitting: Vascular Surgery

## 2022-08-04 VITALS — BP 118/79 | HR 86 | Resp 15

## 2022-08-04 DIAGNOSIS — E119 Type 2 diabetes mellitus without complications: Secondary | ICD-10-CM | POA: Diagnosis not present

## 2022-08-04 DIAGNOSIS — C3490 Malignant neoplasm of unspecified part of unspecified bronchus or lung: Secondary | ICD-10-CM | POA: Diagnosis not present

## 2022-08-04 DIAGNOSIS — C7931 Secondary malignant neoplasm of brain: Secondary | ICD-10-CM | POA: Diagnosis not present

## 2022-08-04 DIAGNOSIS — I7143 Infrarenal abdominal aortic aneurysm, without rupture: Secondary | ICD-10-CM

## 2022-08-04 DIAGNOSIS — I10 Essential (primary) hypertension: Secondary | ICD-10-CM | POA: Diagnosis not present

## 2022-08-04 NOTE — Assessment & Plan Note (Signed)
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  Particularly in the face of her chemotherapy and treatment for malignancy, no intervention will be recommended at this time.  Plan to recheck in 6 months with noninvasive studies since we had to stable checkups recently.

## 2022-08-04 NOTE — Progress Notes (Signed)
MRN : [id number]  Jill Guzman is a 81 y.o. (05-16-1941) female who presents with chief complaint of  Chief Complaint  Patient presents with   Follow-up    3-4 month with AAA  .  History of Present Illness: Patient returns today in follow up of her AAA.  Since her last visit, she has been diagnosed with malignancy and is now undergoing treatment for what sounds like lung and brain tumors.  She is lost weight and is somewhat debilitated appearing.  She is still in reasonably good spirits despite her recent difficulties.  She has no aneurysm related symptoms.  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  Current Outpatient Medications  Medication Sig Dispense Refill   Acetaminophen (TYLENOL PO) Take by mouth as needed.     acetaminophen (TYLENOL) 500 MG tablet Take by mouth.     albuterol (VENTOLIN HFA) 108 (90 Base) MCG/ACT inhaler INHALE 1 PUFF BY MOUTH 4 TIMES DAILY 9 g 0   amLODipine (NORVASC) 2.5 MG tablet Take 1 tablet (2.5 mg total) by mouth daily. 90 tablet 1   amLODipine (NORVASC) 2.5 MG tablet Take 1 tablet by mouth daily.     atorvastatin (LIPITOR) 10 MG tablet Take 1 tablet (10 mg total) by mouth daily. 90 tablet 1   atorvastatin (LIPITOR) 10 MG tablet Take 1 tablet by mouth daily.     Blood Glucose Monitoring Suppl (ACCU-CHEK GUIDE ME) w/Device KIT Test once daily 1 kit 0   budesonide-formoterol (SYMBICORT) 80-4.5 MCG/ACT inhaler Inhale 2 puffs into the lungs 2 (two) times daily. 1 each 12   budesonide-formoterol (SYMBICORT) 80-4.5 MCG/ACT inhaler Inhale 2 puffs into the lungs 2 (two) times daily. 1 each 3   cetirizine (ZYRTEC) 10 MG tablet Take 1 tablet by mouth daily.     Cholecalciferol (VITAMIN D) 50 MCG (2000 UT) CAPS Take by mouth.     Cholecalciferol 50 MCG (2000 UT) TABS Take 1 tablet by mouth daily.     dexamethasone (DECADRON) 4 MG tablet Take 1 tablet by mouth 2 (two) times daily.      famotidine (PEPCID) 20 MG tablet Take 20 mg by mouth 2 (two) times daily.     fluticasone (FLONASE) 50 MCG/ACT nasal spray Place 2 sprays into both nostrils daily. 16 g 6   fluticasone (VERAMYST) 27.5 MCG/SPRAY nasal spray Place into the nose.     HUMALOG KWIKPEN 100 UNIT/ML KwikPen Inject into the skin. Sliding scale     losartan (COZAAR) 100 MG tablet Take 1 tablet (100 mg total) by mouth daily. 90 tablet 1   metFORMIN (GLUCOPHAGE) 500 MG tablet Take 1 tablet (500 mg total) by mouth 2 (two) times daily. (Patient taking differently: Take 500 mg by mouth 2 (two) times daily with a meal. Pt taking 500 mg in AM and 531m at night- endocrinology) 180 tablet 6   Omega 3 1000 MG CAPS Take 1 capsule (1,000 mg total) by mouth daily.     RELION PEN NEEDLES 32G X 4 MM MISC      sertraline (ZOLOFT) 25 MG tablet Take 1 tablet (25 mg total) by mouth daily. 90 tablet 0   cetirizine (ZYRTEC) 10 MG tablet Take 1 tablet by mouth once daily (Patient not taking: Reported on 06/24/2022) 30 tablet 0   insulin detemir (LEVEMIR FLEXPEN) 100 UNIT/ML FlexPen Inject 8 Units into the skin at bedtime.     montelukast (SINGULAIR)  10 MG tablet TAKE 1 TABLET BY MOUTH AT BEDTIME (Patient not taking: Reported on 06/24/2022) 90 tablet 2   spironolactone (ALDACTONE) 50 MG tablet Take 1 tablet by mouth daily.     No current facility-administered medications for this visit.    Past Medical History:  Diagnosis Date   Anxiety    Aortic aneurysm ([place])    Asthma    Bell's palsy    age 74 and age 76    Cancer ([place])    COPD (chronic obstructive pulmonary disease) ([place])    Deaf, right    Depression    Diabetes mellitus without complication ([place])    Hearing aid worn    left   Hyperlipidemia    Hypertension    Shingles 08/13/2020   Varicose veins of legs    Vertigo    random, approx 1x/month   Wears dentures    full upper and lower    Past Surgical History:  Procedure Laterality Date   ABDOMINAL HYSTERECTOMY      BREAST EXCISIONAL BIOPSY Left 1976   neg   CATARACT EXTRACTION W/ INTRAOCULAR LENS  IMPLANT, BILATERAL     COLONOSCOPY  2014   normal   cyst on bladder removed     cyst removed breast Left    KNEE ARTHROSCOPY WITH MEDIAL MENISECTOMY Left 10/20/2019   Procedure: KNEE ARTHROSCOPY WITH PARTIAL MEDIAL MENISECTOMY;  Surgeon: Leim Fabry, MD;  Location: [facility];  Service: Orthopedics;  Laterality: Left;  DIABETIC - oral meds   PARTIAL HYSTERECTOMY     TUBAL LIGATION       Social History   Tobacco Use   Smoking status: Former    Packs/day: 2.00    Years: 10.00    Total pack years: 20.00    Types: Cigarettes    Quit date: 05/11/1999    Years since quitting: 23.2   Smokeless tobacco: Never   Tobacco comments:    Smoking cessation materials not required  Vaping Use   Vaping Use: Never used  Substance Use Topics   Alcohol use: No    Alcohol/week: 0.0 standard drinks of alcohol   Drug use: No      Family History  Problem Relation Age of Onset   Diabetes Mother    Stroke Mother    Healthy Daughter    Cancer Son 72       lung   Hypertension Son    Rheum arthritis Daughter    Hypertension Daughter    Arthritis Daughter    COPD Son    Breast cancer Neg Hx      Allergies  Allergen Reactions   Penicillins Itching   Sulfa Antibiotics Itching   Iodinated Contrast Media Itching    Redness and itchiness     REVIEW OF SYSTEMS (Negative unless checked)  Constitutional: _0 Weight loss  _1 Fever  _2 Chills Cardiac: _3 Chest pain   _4 Chest pressure   _5 Palpitations   _6 Shortness of breath when laying flat   _7 Shortness of breath at rest   _8 Shortness of breath with exertion. Vascular:  _9 Pain in legs with walking   _10 Pain in legs at rest   _11 Pain in legs when laying flat   _12 Claudication   _13 Pain in feet when walking  _14 Pain in feet at rest  _15 Pain in feet when laying flat   _16 History of DVT   _17 Phlebitis   _18 Swelling in legs   _19 Varicose veins   _20 Non-healing  ulcers Pulmonary:   _21 Uses home oxygen   _22 Productive cough   _23   Hemoptysis   _0 Wheeze  _1 COPD   _2 Asthma Neurologic:  _3 Dizziness  _4 Blackouts   _5 Seizures   _6 History of stroke   _7 History of TIA  _8 Aphasia   _9 Temporary blindness   _10 Dysphagia   _11 Weakness or numbness in arms   _12 Weakness or numbness in legs Musculoskeletal:  _13 Arthritis   _14 Joint swelling   _15 Joint pain   _16 Low back pain Hematologic:  _17 Easy bruising  _18 Easy bleeding   _19 Hypercoagulable state   _20 Anemic   Gastrointestinal:  _21 Blood in stool   _22 Vomiting blood  _23 Gastroesophageal reflux/heartburn   _24 Abdominal pain Genitourinary:  _25 Chronic kidney disease   _26 Difficult urination  _27 Frequent urination  _28 Burning with urination   _29 Hematuria Skin:  _30 Rashes   _31 Ulcers   _32 Wounds Psychological:  _33 History of anxiety   _34  History of major depression.  Physical Examination  BP 118/79 (BP Location: Left Arm)   Pulse 86   Resp 15  Gen:  WD/WN, NAD Head: [place]/AT, No temporalis wasting. Ear/Nose/Throat: Hearing grossly intact, nares w/o erythema or drainage Eyes: Conjunctiva clear. Sclera non-icteric Neck: Supple.  Trachea midline Pulmonary:  Good air movement, no use of accessory muscles.  Cardiac: RRR, no JVD Vascular:  Vessel Right Left  Radial Palpable Palpable                                   Gastrointestinal: soft, non-tender/non-distended. No guarding/reflex. Increased aortic impulse Musculoskeletal: M/S 5/5 throughout.  No deformity or atrophy. In a  wheelchair. No edema. Neurologic: Sensation grossly intact in extremities.  Symmetrical.  Speech is fluent.  Psychiatric: Judgment intact, Mood & affect appropriate for pt's clinical situation. Dermatologic: No rashes or ulcers noted.  No cellulitis or open wounds.      Labs Recent Results (from the past 2160 hour(s))  Lipid Panel With LDL/HDL Ratio     Status: Abnormal   Collection Time: 06/24/22  9:56 AM  Result Value Ref Range   Cholesterol, Total  191 100 - 199 mg/dL   Triglycerides 120 0 - 149 mg/dL   HDL 55 >39 mg/dL   VLDL Cholesterol Cal 21 5 - 40 mg/dL   LDL Chol Calc (NIH) 115 (H) 0 - 99 mg/dL   LDL/HDL Ratio 2.1 0.0 - 3.2 ratio    Comment:                                     LDL/HDL Ratio                                             Men  Women                               1/2 Avg.Risk  1.0    1.5                                   Avg.Risk  3.6    3.2                                2X Avg.Risk  6.2  5.0                                3X Avg.Risk  8.0    6.1   Comprehensive Metabolic Panel (CMET)     Status: Abnormal   Collection Time: 06/24/22  9:56 AM  Result Value Ref Range   Glucose 162 (H) 70 - 99 mg/dL   BUN 12 8 - 27 mg/dL   Creatinine, Ser 0.80 0.57 - 1.00 mg/dL   eGFR 75 >59 mL/min/1.73   BUN/Creatinine Ratio 15 12 - 28   Sodium 140 134 - 144 mmol/L   Potassium 4.4 3.5 - 5.2 mmol/L   Chloride 101 96 - 106 mmol/L   CO2 23 20 - 29 mmol/L   Calcium 10.5 (H) 8.7 - 10.3 mg/dL   Total Protein 7.0 6.0 - 8.5 g/dL   Albumin 4.8 3.8 - 4.8 g/dL   Globulin, Total 2.2 1.5 - 4.5 g/dL   Albumin/Globulin Ratio 2.2 1.2 - 2.2   Bilirubin Total 0.5 0.0 - 1.2 mg/dL   Alkaline Phosphatase 61 44 - 121 IU/L   AST 13 0 - 40 IU/L   ALT 11 0 - 32 IU/L  Urine microalbumin-creatinine with uACR     Status: None   Collection Time: 06/24/22  9:56 AM  Result Value Ref Range   Creatinine, Urine 87.8 Not Estab. mg/dL   Microalbumin, Urine 4.9 Not Estab. ug/mL   Microalb/Creat Ratio 6 0 - 29 mg/g creat    Comment:                        Normal:                0 -  29                        Moderately increased: 30 - 300                        Severely increased:       >300     Radiology No results found.  Assessment/Plan Diabetes blood glucose control important in reducing the progression of atherosclerotic disease. Also, involved in wound healing. On appropriate medications.     BP (high blood pressure) blood pressure  control important in reducing the progression of atherosclerotic disease and aneurysmal growth. On appropriate oral medications.  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  Particularly in the face of her chemotherapy and treatment for malignancy, no intervention will be recommended at this time.  Plan to recheck in 6 months with noninvasive studies since we had to stable checkups recently.    Leotis Pain, MD  08/04/2022 11:37 AM    This note was created with Dragon medical transcription system.  Any errors from dictation are purely unintentional

## 2022-08-04 NOTE — Patient Instructions (Signed)
aa

## 2022-08-10 DIAGNOSIS — Z801 Family history of malignant neoplasm of trachea, bronchus and lung: Secondary | ICD-10-CM | POA: Diagnosis not present

## 2022-08-10 DIAGNOSIS — C3412 Malignant neoplasm of upper lobe, left bronchus or lung: Secondary | ICD-10-CM | POA: Diagnosis not present

## 2022-08-10 DIAGNOSIS — Z794 Long term (current) use of insulin: Secondary | ICD-10-CM | POA: Diagnosis not present

## 2022-08-10 DIAGNOSIS — Z7951 Long term (current) use of inhaled steroids: Secondary | ICD-10-CM | POA: Diagnosis not present

## 2022-08-10 DIAGNOSIS — C7931 Secondary malignant neoplasm of brain: Secondary | ICD-10-CM | POA: Diagnosis not present

## 2022-08-10 DIAGNOSIS — E119 Type 2 diabetes mellitus without complications: Secondary | ICD-10-CM | POA: Diagnosis not present

## 2022-08-10 DIAGNOSIS — Z7984 Long term (current) use of oral hypoglycemic drugs: Secondary | ICD-10-CM | POA: Diagnosis not present

## 2022-08-10 DIAGNOSIS — I10 Essential (primary) hypertension: Secondary | ICD-10-CM | POA: Diagnosis not present

## 2022-08-10 DIAGNOSIS — Z79899 Other long term (current) drug therapy: Secondary | ICD-10-CM | POA: Diagnosis not present

## 2022-08-10 DIAGNOSIS — Z510 Encounter for antineoplastic radiation therapy: Secondary | ICD-10-CM | POA: Diagnosis not present

## 2022-08-10 DIAGNOSIS — J449 Chronic obstructive pulmonary disease, unspecified: Secondary | ICD-10-CM | POA: Diagnosis not present

## 2022-08-10 DIAGNOSIS — E7849 Other hyperlipidemia: Secondary | ICD-10-CM | POA: Diagnosis not present

## 2022-08-13 DIAGNOSIS — E119 Type 2 diabetes mellitus without complications: Secondary | ICD-10-CM | POA: Diagnosis not present

## 2022-08-17 ENCOUNTER — Encounter: Payer: Self-pay | Admitting: Family Medicine

## 2022-08-17 DIAGNOSIS — R918 Other nonspecific abnormal finding of lung field: Secondary | ICD-10-CM | POA: Diagnosis not present

## 2022-08-17 DIAGNOSIS — B9689 Other specified bacterial agents as the cause of diseases classified elsewhere: Secondary | ICD-10-CM | POA: Diagnosis not present

## 2022-08-17 DIAGNOSIS — C3490 Malignant neoplasm of unspecified part of unspecified bronchus or lung: Secondary | ICD-10-CM | POA: Diagnosis not present

## 2022-08-17 DIAGNOSIS — G893 Neoplasm related pain (acute) (chronic): Secondary | ICD-10-CM | POA: Diagnosis not present

## 2022-08-17 DIAGNOSIS — G9389 Other specified disorders of brain: Secondary | ICD-10-CM | POA: Diagnosis not present

## 2022-08-17 DIAGNOSIS — E162 Hypoglycemia, unspecified: Secondary | ICD-10-CM | POA: Diagnosis not present

## 2022-08-17 DIAGNOSIS — R0600 Dyspnea, unspecified: Secondary | ICD-10-CM | POA: Diagnosis not present

## 2022-08-17 DIAGNOSIS — E119 Type 2 diabetes mellitus without complications: Secondary | ICD-10-CM | POA: Diagnosis not present

## 2022-08-17 DIAGNOSIS — J019 Acute sinusitis, unspecified: Secondary | ICD-10-CM | POA: Diagnosis not present

## 2022-08-17 DIAGNOSIS — R4789 Other speech disturbances: Secondary | ICD-10-CM | POA: Diagnosis not present

## 2022-08-18 ENCOUNTER — Encounter: Payer: Self-pay | Admitting: Family Medicine

## 2022-08-18 ENCOUNTER — Ambulatory Visit (INDEPENDENT_AMBULATORY_CARE_PROVIDER_SITE_OTHER): Payer: Medicare Other | Admitting: Family Medicine

## 2022-08-18 VITALS — BP 90/52 | HR 74 | Ht 62.0 in | Wt 119.0 lb

## 2022-08-18 DIAGNOSIS — Z880 Allergy status to penicillin: Secondary | ICD-10-CM | POA: Diagnosis not present

## 2022-08-18 DIAGNOSIS — Z7951 Long term (current) use of inhaled steroids: Secondary | ICD-10-CM | POA: Diagnosis not present

## 2022-08-18 DIAGNOSIS — Z87891 Personal history of nicotine dependence: Secondary | ICD-10-CM | POA: Diagnosis not present

## 2022-08-18 DIAGNOSIS — C3412 Malignant neoplasm of upper lobe, left bronchus or lung: Secondary | ICD-10-CM | POA: Diagnosis not present

## 2022-08-18 DIAGNOSIS — R197 Diarrhea, unspecified: Secondary | ICD-10-CM | POA: Diagnosis not present

## 2022-08-18 DIAGNOSIS — N3281 Overactive bladder: Secondary | ICD-10-CM | POA: Diagnosis not present

## 2022-08-18 DIAGNOSIS — J189 Pneumonia, unspecified organism: Secondary | ICD-10-CM | POA: Diagnosis not present

## 2022-08-18 DIAGNOSIS — J441 Chronic obstructive pulmonary disease with (acute) exacerbation: Secondary | ICD-10-CM | POA: Diagnosis not present

## 2022-08-18 DIAGNOSIS — R0602 Shortness of breath: Secondary | ICD-10-CM | POA: Diagnosis not present

## 2022-08-18 DIAGNOSIS — R051 Acute cough: Secondary | ICD-10-CM | POA: Diagnosis not present

## 2022-08-18 DIAGNOSIS — J019 Acute sinusitis, unspecified: Secondary | ICD-10-CM | POA: Diagnosis not present

## 2022-08-18 DIAGNOSIS — R059 Cough, unspecified: Secondary | ICD-10-CM | POA: Diagnosis not present

## 2022-08-18 DIAGNOSIS — E861 Hypovolemia: Secondary | ICD-10-CM | POA: Diagnosis not present

## 2022-08-18 DIAGNOSIS — R0609 Other forms of dyspnea: Secondary | ICD-10-CM | POA: Diagnosis not present

## 2022-08-18 DIAGNOSIS — Z794 Long term (current) use of insulin: Secondary | ICD-10-CM | POA: Diagnosis not present

## 2022-08-18 DIAGNOSIS — R531 Weakness: Secondary | ICD-10-CM | POA: Diagnosis not present

## 2022-08-18 DIAGNOSIS — R0789 Other chest pain: Secondary | ICD-10-CM | POA: Diagnosis not present

## 2022-08-18 DIAGNOSIS — I959 Hypotension, unspecified: Secondary | ICD-10-CM

## 2022-08-18 DIAGNOSIS — C7931 Secondary malignant neoplasm of brain: Secondary | ICD-10-CM | POA: Diagnosis not present

## 2022-08-18 DIAGNOSIS — I10 Essential (primary) hypertension: Secondary | ICD-10-CM | POA: Diagnosis not present

## 2022-08-18 DIAGNOSIS — J9601 Acute respiratory failure with hypoxia: Secondary | ICD-10-CM | POA: Diagnosis not present

## 2022-08-18 DIAGNOSIS — R911 Solitary pulmonary nodule: Secondary | ICD-10-CM | POA: Diagnosis not present

## 2022-08-18 DIAGNOSIS — C3490 Malignant neoplasm of unspecified part of unspecified bronchus or lung: Secondary | ICD-10-CM

## 2022-08-18 DIAGNOSIS — J449 Chronic obstructive pulmonary disease, unspecified: Secondary | ICD-10-CM

## 2022-08-18 DIAGNOSIS — E871 Hypo-osmolality and hyponatremia: Secondary | ICD-10-CM | POA: Diagnosis not present

## 2022-08-18 DIAGNOSIS — R519 Headache, unspecified: Secondary | ICD-10-CM | POA: Diagnosis not present

## 2022-08-18 DIAGNOSIS — H9190 Unspecified hearing loss, unspecified ear: Secondary | ICD-10-CM | POA: Diagnosis not present

## 2022-08-18 DIAGNOSIS — R5381 Other malaise: Secondary | ICD-10-CM | POA: Diagnosis not present

## 2022-08-18 DIAGNOSIS — Z882 Allergy status to sulfonamides status: Secondary | ICD-10-CM | POA: Diagnosis not present

## 2022-08-18 DIAGNOSIS — E1165 Type 2 diabetes mellitus with hyperglycemia: Secondary | ICD-10-CM | POA: Diagnosis not present

## 2022-08-18 DIAGNOSIS — E7849 Other hyperlipidemia: Secondary | ICD-10-CM | POA: Diagnosis not present

## 2022-08-18 DIAGNOSIS — E860 Dehydration: Secondary | ICD-10-CM | POA: Diagnosis not present

## 2022-08-18 DIAGNOSIS — Z0389 Encounter for observation for other suspected diseases and conditions ruled out: Secondary | ICD-10-CM | POA: Diagnosis not present

## 2022-08-18 DIAGNOSIS — Z20822 Contact with and (suspected) exposure to covid-19: Secondary | ICD-10-CM | POA: Diagnosis not present

## 2022-08-18 DIAGNOSIS — R0902 Hypoxemia: Secondary | ICD-10-CM | POA: Diagnosis not present

## 2022-08-18 NOTE — Progress Notes (Signed)
Date:  08/18/2022   Name:  Jill Guzman   DOB:  [birth year]   MRN:  [id number]   Chief Complaint: URI (Chest congestion and head congestion for a week, had appointment with Oncology dr this morning was told to be seen by PCP, he called [facility] , just started them today. Came off steroids 1 week ago. )  Shortness of Breath This is a new problem. The problem occurs intermittently. The problem has been unchanged. Associated symptoms include sputum production and wheezing. Pertinent negatives include no chest pain, fever, headaches, leg swelling, neck pain, orthopnea, PND, rhinorrhea or sore throat. Associated symptoms comments: Dyspnea on exertion/productive yellow/green. She has tried beta agonist inhalers, steroid inhalers and oral steroids for the symptoms. The treatment provided mild relief.    Lab Results  Component Value Date   NA 140 06/24/2022   K 4.4 06/24/2022   CO2 23 06/24/2022   GLUCOSE 162 (H) 06/24/2022   BUN 12 06/24/2022   CREATININE 0.80 06/24/2022   CALCIUM 10.5 (H) 06/24/2022   EGFR 75 06/24/2022   GFRNONAA >60 07/31/2020   Lab Results  Component Value Date   CHOL 191 06/24/2022   HDL 55 06/24/2022   LDLCALC 115 (H) 06/24/2022   TRIG 120 06/24/2022   CHOLHDL 2.9 03/29/2019   Lab Results  Component Value Date   TSH 1.69 06/13/2021   Lab Results  Component Value Date   HGBA1C 7.2 04/14/2022   Lab Results  Component Value Date   WBC 8.3 03/29/2019   HGB 14.1 03/29/2019   HCT 42.9 03/29/2019   MCV 85 03/29/2019   PLT 225 03/29/2019   Lab Results  Component Value Date   ALT 11 06/24/2022   AST 13 06/24/2022   ALKPHOS 61 06/24/2022   BILITOT 0.5 06/24/2022   No results found for: "25OHVITD2", "25OHVITD3", "VD25OH"   Review of Systems  Constitutional:  Negative for fever.  HENT:  Negative for postnasal drip, rhinorrhea and sore throat.   Respiratory:  Positive for cough, sputum production, chest tightness, shortness of breath and wheezing.    Cardiovascular:  Negative for chest pain, palpitations, orthopnea, leg swelling and PND.  Musculoskeletal:  Negative for neck pain.  Neurological:  Negative for headaches.    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   Clinical depression 12/31/2014   AB (asthmatic bronchitis) 12/31/2014   Routine general medical examination at a health care facility 12/31/2014   Diabetes ([place]) 12/31/2014   BP (high blood pressure) 12/31/2014   Osteopenia 12/31/2014    Allergies  Allergen Reactions   Penicillins Itching   Sulfa Antibiotics Itching   Iodinated Contrast Media Itching    Redness and itchiness    Past Surgical History:  Procedure Laterality Date   ABDOMINAL HYSTERECTOMY     BREAST EXCISIONAL BIOPSY Left 1976   neg   CATARACT EXTRACTION W/ INTRAOCULAR LENS  IMPLANT, BILATERAL     COLONOSCOPY  2014   normal   cyst on bladder removed     cyst removed breast Left    KNEE ARTHROSCOPY WITH MEDIAL MENISECTOMY  Left 10/20/2019   Procedure: KNEE ARTHROSCOPY WITH PARTIAL MEDIAL MENISECTOMY;  Surgeon: Leim Fabry, MD;  Location: [facility];  Service: Orthopedics;  Laterality: Left;  DIABETIC - oral meds   PARTIAL HYSTERECTOMY     TUBAL LIGATION      Social History   Tobacco Use   Smoking status: Former    Packs/day: 2.00    Years: 10.00    Total pack years: 20.00    Types: Cigarettes    Quit date: 05/11/1999    Years since quitting: 23.2   Smokeless tobacco: Never    Tobacco comments:    Smoking cessation materials not required  Vaping Use   Vaping Use: Never used  Substance Use Topics   Alcohol use: No    Alcohol/week: 0.0 standard drinks of alcohol   Drug use: No     Medication list has been reviewed and updated.  Current Meds  Medication Sig   acetaminophen (TYLENOL) 500 MG tablet Take by mouth.   albuterol (VENTOLIN HFA) 108 (90 Base) MCG/ACT inhaler INHALE 1 PUFF BY MOUTH 4 TIMES DAILY   amLODipine (NORVASC) 2.5 MG tablet Take 1 tablet (2.5 mg total) by mouth daily.   atorvastatin (LIPITOR) 10 MG tablet Take 1 tablet (10 mg total) by mouth daily.   Blood Glucose Monitoring Suppl (ACCU-CHEK GUIDE ME) w/Device KIT Test once daily   budesonide-formoterol (SYMBICORT) 80-4.5 MCG/ACT inhaler Inhale 2 puffs into the lungs 2 (two) times daily.   cetirizine (ZYRTEC) 10 MG tablet Take 1 tablet by mouth once daily   Cholecalciferol (VITAMIN D) 50 MCG (2000 UT) CAPS Take by mouth.   dexamethasone (DECADRON) 4 MG tablet Take 1 tablet by mouth 2 (two) times daily.   doxycycline (VIBRA-TABS) 100 MG tablet Take by mouth.   famotidine (PEPCID) 20 MG tablet Take 20 mg by mouth 2 (two) times daily.   fluticasone (FLONASE) 50 MCG/ACT nasal spray Place 2 sprays into both nostrils daily.   fluticasone (VERAMYST) 27.5 MCG/SPRAY nasal spray Place into the nose.   HUMALOG KWIKPEN 100 UNIT/ML KwikPen Inject into the skin. Sliding scale   losartan (COZAAR) 100 MG tablet Take 1 tablet (100 mg total) by mouth daily.   metFORMIN (GLUCOPHAGE) 500 MG tablet Take 1 tablet (500 mg total) by mouth 2 (two) times daily. (Patient taking differently: Take 500 mg by mouth 2 (two) times daily with a meal. Pt taking 500 mg in AM and 57m at night- endocrinology)   montelukast (SINGULAIR) 10 MG tablet TAKE 1 TABLET BY MOUTH AT BEDTIME   Omega 3 1000 MG CAPS Take 1 capsule (1,000 mg total) by mouth daily.   RELION PEN NEEDLES 32G X 4 MM MISC    sertraline (ZOLOFT) 25 MG tablet Take  1 tablet (25 mg total) by mouth daily.   [DISCONTINUED] Acetaminophen (TYLENOL PO) Take by mouth as needed.       06/24/2022    8:57 AM 12/23/2021    8:25 AM 10/09/2021    4:05 PM 06/24/2021    9:18 AM  GAD 7 : Generalized Anxiety Score  Nervous, Anxious, on Edge 0 1 0 2  Control/stop worrying 0 1 0 2  Worry too much - different things 0 1 0 2  Trouble relaxing 0 1 1 0  Restless 0 0 0 0  Easily annoyed or irritable 0 2 0 2  Afraid - awful might happen 0 0 0 0  Total GAD 7 Score 0 _0 Anxiety Difficulty Not difficult  at all Somewhat difficult  Not difficult at all       06/24/2022    8:57 AM 12/23/2021    8:24 AM 10/20/2021    8:54 AM  Depression screen PHQ 2/9  Decreased Interest 0 0 0  Down, Depressed, Hopeless 1 2 0  PHQ - 2 Score 1 2 0  Altered sleeping 0 1 0  Tired, decreased energy 0 0 0  Change in appetite 0 0 0  Feeling bad or failure about yourself  0 1 0  Trouble concentrating 0 0 0  Moving slowly or fidgety/restless 0 0 0  Suicidal thoughts 0 0 0  PHQ-9 Score 1 4 0  Difficult doing work/chores Not difficult at all Somewhat difficult Not difficult at all    BP Readings from Last 3 Encounters:  08/18/22 (!) 90/52  08/04/22 118/79  06/24/22 118/70    Physical Exam Vitals and nursing note reviewed.  HENT:     Right Ear: Tympanic membrane normal.     Nose: Nose normal.     Mouth/Throat:     Mouth: Mucous membranes are moist.  Eyes:     General:        Right eye: No discharge.        Left eye: No discharge.  Pulmonary:     Breath sounds: Examination of the right-middle field reveals decreased breath sounds. Examination of the right-lower field reveals decreased breath sounds. Decreased breath sounds present. No wheezing, rhonchi or rales.     Wt Readings from Last 3 Encounters:  08/18/22 119 lb (54 kg)  06/24/22 124 lb (56.2 kg)  01/27/22 125 lb (56.7 kg)    BP (!) 90/52   Pulse 74   Ht _0  (1.575 m)   Wt 119 lb (54 kg)   SpO2 97%   BMI  21.77 kg/m   Assessment and Plan:  1. Acute cough Acute cough that patient has had for over a week with none resolution with conservative measures.  Patient has tried mucolytic cough preparation which has been helpful for getting sputum out which has been of a yellow-green productivity nature.  At this point in time we will proceed with further evaluation.  2. Pneumonia of right lower lobe due to infectious organism On auscultation it was noted that patient has decreased breath sounds on the right posterior lobe I do not know what her previous x-rays have shown but at this point in time given her vital signs pulse ox 94 overall malaise and shortness of breath I am suspicious for pneumonia.  Patient was started on doxycycline and I think she has had 2 dosings.  3. Hypotension, unspecified hypotension type Blood pressure is noted to be 90/52 I am concerned about dehydration but I am also have concerns about sepsis either of a pulmonic or urinary etiology.  We will refer to a hospital ER setting for evaluation and patient has a history of non-small cell cancer with metastases and there is therefore an element of immunosuppression that would need to be taken in consideration  4. Chronic obstructive pulmonary disease, unspecified COPD type ([place]) Given the decreased breath sounds in the right posterior lobe patient has baseline COPD which is of concern which we need to further take in account that there is a level of compromise from the pulmonary aspect.  5. 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   Otilio Miu, MD

## 2022-08-24 ENCOUNTER — Ambulatory Visit (INDEPENDENT_AMBULATORY_CARE_PROVIDER_SITE_OTHER): Payer: Medicare Other | Admitting: Family Medicine

## 2022-08-24 ENCOUNTER — Encounter: Payer: Self-pay | Admitting: Family Medicine

## 2022-08-24 VITALS — BP 122/62 | HR 87 | Temp 97.6°F | Ht 62.0 in | Wt 111.0 lb

## 2022-08-24 DIAGNOSIS — C3412 Malignant neoplasm of upper lobe, left bronchus or lung: Secondary | ICD-10-CM | POA: Diagnosis not present

## 2022-08-24 DIAGNOSIS — J449 Chronic obstructive pulmonary disease, unspecified: Secondary | ICD-10-CM | POA: Diagnosis not present

## 2022-08-24 DIAGNOSIS — F329 Major depressive disorder, single episode, unspecified: Secondary | ICD-10-CM | POA: Diagnosis not present

## 2022-08-24 DIAGNOSIS — J301 Allergic rhinitis due to pollen: Secondary | ICD-10-CM | POA: Diagnosis not present

## 2022-08-24 DIAGNOSIS — Z23 Encounter for immunization: Secondary | ICD-10-CM | POA: Diagnosis not present

## 2022-08-24 MED ORDER — TRIAMCINOLONE ACETONIDE 55 MCG/ACT NA AERO: 2.0000 | INHALATION_SPRAY | Freq: Every day | NASAL | 12 refills | Status: DC

## 2022-08-24 NOTE — Progress Notes (Signed)
Date:  08/24/2022   Name:  Jill Guzman   DOB:  1941-07-15   MRN:  [id number]   Chief Complaint: Hospitalization Follow-up and Depression (Wants to increase Zoloft to next dose.)  Depression        This is a chronic problem.  The current episode started more than 1 year ago.   The onset quality is gradual.   The problem occurs daily.  The problem has been gradually improving since onset.  Associated symptoms include no decreased concentration, no fatigue, no helplessness, no hopelessness, does not have insomnia, not irritable, no restlessness, no decreased interest, no appetite change, no body aches, no myalgias, no headaches, no indigestion, not sad and no suicidal ideas.   Lab Results  Component Value Date   NA 140 06/24/2022   K 4.4 06/24/2022   CO2 23 06/24/2022   GLUCOSE 162 (H) 06/24/2022   BUN 12 06/24/2022   CREATININE 0.80 06/24/2022   CALCIUM 10.5 (H) 06/24/2022   EGFR 75 06/24/2022   GFRNONAA >60 07/31/2020   Lab Results  Component Value Date   CHOL 191 06/24/2022   HDL 55 06/24/2022   LDLCALC 115 (H) 06/24/2022   TRIG 120 06/24/2022   CHOLHDL 2.9 03/29/2019   Lab Results  Component Value Date   TSH 1.69 06/13/2021   Lab Results  Component Value Date   HGBA1C 7.2 04/14/2022   Lab Results  Component Value Date   WBC 8.3 03/29/2019   HGB 14.1 03/29/2019   HCT 42.9 03/29/2019   MCV 85 03/29/2019   PLT 225 03/29/2019   Lab Results  Component Value Date   ALT 11 06/24/2022   AST 13 06/24/2022   ALKPHOS 61 06/24/2022   BILITOT 0.5 06/24/2022   No results found for: "25OHVITD2", "25OHVITD3", "VD25OH"   Review of Systems  Constitutional:  Negative for appetite change and fatigue.  Musculoskeletal:  Negative for myalgias.  Neurological:  Negative for headaches.  Psychiatric/Behavioral:  Positive for depression. Negative for decreased concentration and suicidal ideas. The patient does not have insomnia.     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   Nonexudative age-related macular degeneration, bilateral, early dry stage 03/27/2019   Recurrent major depressive disorder, in partial remission ([place]) 02/14/2018   Chronic anxiety 02/09/2017   AB (asthmatic bronchitis), mild intermittent, uncomplicated 20/94/7096   Chronic obstructive pulmonary disease ([place]) 07/14/2016   Familial multiple lipoprotein-type hyperlipidemia 12/31/2014   Clinical depression 12/31/2014   AB (asthmatic bronchitis) 12/31/2014   Routine general medical examination at a health care facility 12/31/2014   Diabetes ([place]) 12/31/2014   BP (high blood pressure) 12/31/2014   Osteopenia 12/31/2014    Allergies  Allergen Reactions   Penicillins Itching   Sulfa Antibiotics Itching   Iodinated Contrast Media Itching    Redness and itchiness    Past Surgical History:  Procedure Laterality Date   ABDOMINAL HYSTERECTOMY     BREAST EXCISIONAL BIOPSY Left 1976   neg   CATARACT EXTRACTION W/ INTRAOCULAR LENS  IMPLANT, BILATERAL     COLONOSCOPY  2014   normal   cyst on bladder removed     cyst removed breast Left    KNEE ARTHROSCOPY WITH MEDIAL MENISECTOMY Left 10/20/2019   Procedure: KNEE ARTHROSCOPY WITH PARTIAL MEDIAL MENISECTOMY;  Surgeon: Leim Fabry, MD;  Location: [facility];  Service: Orthopedics;  Laterality: Left;  DIABETIC - oral meds   PARTIAL HYSTERECTOMY     TUBAL LIGATION      Social History   Tobacco Use   Smoking status: Former    Packs/day: 2.00    Years: 10.00    Total pack years: 20.00    Types: Cigarettes    Quit date: 05/11/1999    Years since quitting: 23.3   Smokeless tobacco: Never   Tobacco comments:    Smoking cessation materials not required  Vaping Use   Vaping Use: Never used  Substance Use Topics   Alcohol use: No    Alcohol/week: 0.0 standard drinks of alcohol   Drug use: No      Medication list has been reviewed and updated.  Current Meds  Medication Sig   acetaminophen (TYLENOL) 500 MG tablet Take by mouth.   albuterol (VENTOLIN HFA) 108 (90 Base) MCG/ACT inhaler INHALE 1 PUFF BY MOUTH 4 TIMES DAILY   amLODipine (NORVASC) 2.5 MG tablet Take 1 tablet (2.5 mg total) by mouth daily.   atorvastatin (LIPITOR) 10 MG tablet Take 1 tablet (10 mg total) by mouth daily.   Blood Glucose Monitoring Suppl (ACCU-CHEK GUIDE ME) w/Device KIT Test once daily   budesonide-formoterol (SYMBICORT) 80-4.5 MCG/ACT inhaler Inhale 2 puffs into the lungs 2 (two) times daily.   cetirizine (ZYRTEC) 10 MG tablet Take 1 tablet by mouth once daily   Cholecalciferol (VITAMIN D) 50 MCG (2000 UT) CAPS Take by mouth.   dexamethasone (DECADRON) 4 MG tablet Take 1 tablet by mouth 2 (two) times daily.   famotidine (PEPCID) 20 MG tablet Take 20 mg by mouth 2 (two) times daily.   fluticasone (FLONASE) 50 MCG/ACT nasal spray Place 2 sprays into both nostrils daily.   fluticasone (VERAMYST) 27.5 MCG/SPRAY nasal spray Place into the nose.   HUMALOG KWIKPEN 100 UNIT/ML KwikPen Inject into the skin. Sliding scale   losartan (COZAAR) 100 MG tablet Take 1 tablet (100 mg total) by mouth daily.   metFORMIN (GLUCOPHAGE) 500 MG tablet Take 1 tablet (500 mg total) by mouth 2 (two) times daily. (Patient taking differently: Take 500 mg by mouth 2 (two) times daily with a meal. Pt taking 500 mg in AM and 567m at night- endocrinology)   montelukast (SINGULAIR) 10 MG tablet TAKE 1 TABLET BY MOUTH AT BEDTIME   Omega 3 1000 MG CAPS Take 1 capsule (1,000 mg total) by mouth daily.   RELION PEN NEEDLES 32G X 4 MM MISC    sertraline (ZOLOFT) 25 MG tablet Take 1 tablet (25 mg total) by mouth daily.   [DISCONTINUED] doxycycline (VIBRA-TABS) 100 MG tablet Take by mouth.       08/24/2022    3:57 PM 06/24/2022    8:57 AM 12/23/2021    8:25 AM 10/09/2021    4:05 PM  GAD 7 : Generalized Anxiety Score  Nervous, Anxious,  on Edge 3 0 1 0  Control/stop worrying 3 0 1 0  Worry too much - different things 3 0 1 0  Trouble relaxing 1 0 1 1  Restless 1 0 0 0  Easily annoyed or irritable 2 0 2 0  Afraid - awful might happen 1 0 0 0  Total GAD 7 Score 14 0 6 1  Anxiety Difficulty Very difficult Not difficult at all Somewhat difficult        08/24/2022    3:57 PM 06/24/2022    8:57 AM 12/23/2021    8:24 AM  Depression screen PHQ 2/9  Decreased  Interest 1 0 0  Down, Depressed, Hopeless _0 PHQ - 2 Score _1 Altered sleeping 0 0 1  Tired, decreased energy 3 0 0  Change in appetite 0 0 0  Feeling bad or failure about yourself  0 0 1  Trouble concentrating 0 0 0  Moving slowly or fidgety/restless 0 0 0  Suicidal thoughts 0 0 0  PHQ-9 Score _2 Difficult doing work/chores Not difficult at all Not difficult at all Somewhat difficult    BP Readings from Last 3 Encounters:  08/24/22 122/62  08/18/22 (!) 90/52  08/04/22 118/79    Physical Exam Vitals and nursing note reviewed. Exam conducted with a chaperone present.  Constitutional:      General: She is not irritable.She is not in acute distress.    Appearance: She is not diaphoretic.  HENT:     Head: Normocephalic and atraumatic.     Right Ear: External ear normal.     Left Ear: External ear normal.     Nose: Congestion present. No rhinorrhea.     Mouth/Throat:     Mouth: Mucous membranes are moist.  Eyes:     General:        Right eye: No discharge.        Left eye: No discharge.     Conjunctiva/sclera: Conjunctivae normal.     Pupils: Pupils are equal, round, and reactive to light.  Neck:     Thyroid: No thyromegaly.     Vascular: No JVD.  Cardiovascular:     Rate and Rhythm: Normal rate and regular rhythm.     Heart sounds: Normal heart sounds. No murmur heard.    No friction rub. No gallop.  Pulmonary:     Effort: Pulmonary effort is normal.     Breath sounds: Normal breath sounds. No wheezing, rhonchi or rales.   Abdominal:     General: Bowel sounds are normal.     Palpations: Abdomen is soft. There is no mass.     Tenderness: There is no abdominal tenderness. There is no guarding.  Musculoskeletal:        General: No swelling or tenderness. Normal range of motion.     Cervical back: Normal range of motion and neck supple.  Lymphadenopathy:     Cervical: No cervical adenopathy.  Skin:    General: Skin is warm and dry.  Neurological:     Mental Status: She is alert.     Deep Tendon Reflexes: Reflexes are normal and symmetric.     Wt Readings from Last 3 Encounters:  08/24/22 111 lb (50.3 kg)  08/18/22 119 lb (54 kg)  06/24/22 124 lb (56.2 kg)    BP 122/62   Pulse 87   Temp 97.6 F (36.4 C) (Oral)   Ht _3  (1.575 m)   Wt 111 lb (50.3 kg)   SpO2 92%   BMI 20.30 kg/m   Assessment and Plan:  1. Chronic obstructive pulmonary disease, unspecified COPD type (HCC) Chronic.  Controlled.  Stable.  Discussed most recent discharge inhalers which include Symbicort every 12 hours and DuoNeb every 6 hours nebulization.  Patient also has albuterol handheld to be used in the place of the DuoNeb as convenience.  I told her why we are in the process of getting things under control that we may want to use at least every 6 hours if that the Symbicort or the DuoNeb for the next 2 to 3  weeks.  2. 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  Controlled.  Stable.  Patient had some bleeding when using the Flonase likely from the alcohol-based we will switch over to Nasacort aqueous based 2 sprays in each nostril daily. - triamcinolone (NASACORT) 55 MCG/ACT AERO nasal inhaler; Place 2 sprays into the nose daily.  Dispense: 1 each; Refill: 12  3. 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  4.  Need for immunization against influenza Discussed and administered - Flu Vaccine QUAD 66moIM (Fluarix, Fluzone & Alfiuria Quad PF)    DOtilio Miu MD

## 2022-08-25 ENCOUNTER — Telehealth: Payer: Self-pay | Admitting: *Deleted

## 2022-08-26 ENCOUNTER — Other Ambulatory Visit: Payer: Self-pay | Admitting: Family Medicine

## 2022-08-26 ENCOUNTER — Telehealth: Payer: Self-pay | Admitting: *Deleted

## 2022-08-26 DIAGNOSIS — C7931 Secondary malignant neoplasm of brain: Secondary | ICD-10-CM | POA: Insufficient documentation

## 2022-08-26 DIAGNOSIS — J441 Chronic obstructive pulmonary disease with (acute) exacerbation: Secondary | ICD-10-CM

## 2022-08-26 DIAGNOSIS — J4520 Mild intermittent asthma, uncomplicated: Secondary | ICD-10-CM

## 2022-08-26 NOTE — Patient Outreach (Signed)
  Care Coordination [facility] Note Transition Care Management Follow-up Telephone Call Date of discharge and from where: [facility] [phone] How have you been since you were released from the hospital? Doing fair. Still having some shortness of breath. RN discussed doing purse lip breathing also.  Any questions or concerns? Yes Daughter had questions about medications from [facility] Items Reviewed: Did the pt receive and understand the discharge instructions provided? No  Medications obtained and verified? Yes  RN went over the [facility] medications and Dr [name] orders. Daughter voiced understanding Other? No  Any new allergies since your discharge? No  Dietary orders reviewed? Yes carb modified soft diet due to her dentures Do you have support at home? Yes   Home Care and Equipment/Supplies: Were home health services ordered? not applicable If so, what is the name of the agency? N Has the agency set up a time to come to the patient's home? not applicable Were any new equipment or medical supplies ordered?  No What is the name of the medical supply agency? n Were you able to get the supplies/equipment? not applicable Do you have any questions related to the use of the equipment or supplies? No  Functional Questionnaire: (I = Independent and D = Dependent) ADLs: D  Bathing/Dressing- D  Meal Prep- D  Eating- I  Maintaining continence- I  Transferring/Ambulation- I  Managing Meds- D  Follow up appointments reviewed:  PCP Hospital f/u appt confirmed? Yes  Dr [name] [phone] [facility] f/u appt confirmed? Yes  Oncology for radiation [phone] 5:15 awaiting actual insurance approval.  Are transportation arrangements needed? No  If their condition worsens, is the pt aware to call PCP or go to the Emergency Dept.? Yes Was the patient provided with contact information for the PCP's office or ED? Yes Was to pt encouraged to call back with questions or concerns? Yes  SDOH assessments and  interventions completed:   Yes SDOH Interventions Today    Flowsheet Row Most Recent Value  SDOH Interventions   Food Insecurity Interventions Intervention Not Indicated  Housing Interventions Intervention Not Indicated  Transportation Interventions Intervention Not Indicated       Care Coordination Interventions:  RN set up the [facility] meal Plan for patient for 14 days of meals to be delivered    Encounter Outcome:  Pt. Visit Completed    [facility] [phone]

## 2022-08-27 ENCOUNTER — Telehealth: Payer: Self-pay

## 2022-08-27 NOTE — Telephone Encounter (Signed)
   Telephone encounter was:  Unsuccessful.  08/27/2022 Name: Jill Guzman MRN: [id number] DOB: 15-Dec-1940  Unsuccessful outbound call made today to assist with:  Food Insecurity  Outreach Attempt:  1st Attempt  A HIPAA compliant voice message was left requesting a return call.  Instructed patient to call back .    [place], Care Management  [phone] [address], [place], [place] [address] Phone: [phone] Email: [email or web address]

## 2022-08-28 ENCOUNTER — Telehealth: Payer: Self-pay

## 2022-08-28 ENCOUNTER — Telehealth: Payer: Medicare Other | Admitting: Physician Assistant

## 2022-08-28 ENCOUNTER — Ambulatory Visit: Payer: Self-pay | Admitting: *Deleted

## 2022-08-28 DIAGNOSIS — J449 Chronic obstructive pulmonary disease, unspecified: Secondary | ICD-10-CM | POA: Diagnosis not present

## 2022-08-28 DIAGNOSIS — Z510 Encounter for antineoplastic radiation therapy: Secondary | ICD-10-CM | POA: Diagnosis not present

## 2022-08-28 DIAGNOSIS — Z794 Long term (current) use of insulin: Secondary | ICD-10-CM | POA: Diagnosis not present

## 2022-08-28 DIAGNOSIS — C3412 Malignant neoplasm of upper lobe, left bronchus or lung: Secondary | ICD-10-CM | POA: Diagnosis not present

## 2022-08-28 DIAGNOSIS — E119 Type 2 diabetes mellitus without complications: Secondary | ICD-10-CM | POA: Diagnosis not present

## 2022-08-28 DIAGNOSIS — Z7984 Long term (current) use of oral hypoglycemic drugs: Secondary | ICD-10-CM | POA: Diagnosis not present

## 2022-08-28 DIAGNOSIS — I952 Hypotension due to drugs: Secondary | ICD-10-CM | POA: Diagnosis not present

## 2022-08-28 DIAGNOSIS — Z7951 Long term (current) use of inhaled steroids: Secondary | ICD-10-CM | POA: Diagnosis not present

## 2022-08-28 DIAGNOSIS — Z79899 Other long term (current) drug therapy: Secondary | ICD-10-CM | POA: Diagnosis not present

## 2022-08-28 DIAGNOSIS — Z801 Family history of malignant neoplasm of trachea, bronchus and lung: Secondary | ICD-10-CM | POA: Diagnosis not present

## 2022-08-28 DIAGNOSIS — C7931 Secondary malignant neoplasm of brain: Secondary | ICD-10-CM | POA: Diagnosis not present

## 2022-08-28 DIAGNOSIS — I10 Essential (primary) hypertension: Secondary | ICD-10-CM | POA: Diagnosis not present

## 2022-08-28 DIAGNOSIS — E7849 Other hyperlipidemia: Secondary | ICD-10-CM | POA: Diagnosis not present

## 2022-08-28 NOTE — Telephone Encounter (Signed)
  Chief Complaint: BP Low Symptoms: BP yesterday 88/54, dizzy at times Frequency: Unsure Pertinent Negatives: Patient denies  Disposition: [] ED /[] Urgent Care (no appt availability in office) / [] Appointment(In office/virtual)/ [x]  [facility]/ [] Home Care/ [] Refused Recommended Disposition /[] [facility]/ []  Follow-up with PCP Additional Notes: Pt's granddaughter calling, pt is not present, "Cannot hear you on the phone."  States pts BP has been low "For a while." 88/54 yesterday, no values today. Questioning if pt should stop one of her BP meds. Advised [facility], secured for pt. Care advise provided, verbalizes understanding.  After hours call, holiday. Reason for Disposition  [0] Systolic BP [id number] AND [9] taking blood pressure medications AND [3] dizzy, lightheaded or weak  Answer Assessment - Initial Assessment Questions 1. BLOOD PRESSURE: "What is the blood pressure?" "Did you take at least two measurements 5 minutes apart?"     88/54 2. ONSET: "When did you take your blood pressure?"     *No Answer* 3. HOW: "How did you obtain the blood pressure?" (e.g., visiting nurse, automatic home BP monitor)     *No Answer* 4. HISTORY: "Do you have a history of low blood pressure?" "What is your blood pressure normally?"     *No Answer* 5. MEDICINES: "Are you taking any medications for blood pressure?" If Yes, ask: "Have they been changed recently?"     *No Answer* 6. PULSE RATE: "Do you know what your pulse rate is?"      *No Answer* 7. OTHER SYMPTOMS: "Have you been sick recently?" "Have you had a recent injury?"     *No Answer* 8. PREGNANCY: "Is there any chance you are pregnant?" "When was your last menstrual period?"     *No Answer*  Protocols used: Blood Pressure - Low-A-AH

## 2022-08-28 NOTE — Telephone Encounter (Signed)
   Telephone encounter was:  Successful.  08/28/2022 Name: Jill Guzman MRN: [id number] DOB: 1940/12/26  [name] is a 81 y.o. year old female who is a primary care patient of Juline Patch, MD . The community resource team was consulted for assistance with  [place] guide performed the following interventions: Patient provided with information about care guide support team and interviewed to confirm resource needs.Patient stated she wanted me to call back in tuesday at 12 to call [facility] for moms meals   Follow Up Plan:  Care guide will follow up with patient by phone over the next Tuesday    Amirra Herling [facility] Care Guide, [facility]  [phone] [address], [place], [place] [address] Phone: [phone] Email: [email or web address]

## 2022-08-28 NOTE — Patient Instructions (Signed)
Jill Guzman, thank you for joining Mar Daring, PA-C for today's virtual visit.  While this provider is not your primary care provider (PCP), if your PCP is located in our provider database this encounter information will be shared with them immediately following your visit.   [facility] account gives you access to today's visit and all your visits, tests, and labs performed at [facility] " click here if you don't have a [facility] account or go to [email or web address]  Consent: (Patient) Jill Guzman provided verbal consent for this virtual visit at the beginning of the encounter.  Current Medications:  Current Outpatient Medications:    acetaminophen (TYLENOL) 500 MG tablet, Take by mouth., Disp: , Rfl:    albuterol (VENTOLIN HFA) 108 (90 Base) MCG/ACT inhaler, INHALE 1 PUFF BY MOUTH 4 TIMES DAILY, Disp: 9 g, Rfl: 0   atorvastatin (LIPITOR) 10 MG tablet, Take 1 tablet (10 mg total) by mouth daily., Disp: 90 tablet, Rfl: 1   Blood Glucose Monitoring Suppl (ACCU-CHEK GUIDE ME) w/Device KIT, Test once daily, Disp: 1 kit, Rfl: 0   budesonide-formoterol (SYMBICORT) 80-4.5 MCG/ACT inhaler, Inhale 2 puffs into the lungs 2 (two) times daily., Disp: 1 each, Rfl: 12   cetirizine (ZYRTEC) 10 MG tablet, Take 1 tablet by mouth once daily, Disp: 30 tablet, Rfl: 0   Cholecalciferol (VITAMIN D) 50 MCG (2000 UT) CAPS, Take by mouth., Disp: , Rfl:    dexamethasone (DECADRON) 4 MG tablet, Take 1 tablet by mouth 2 (two) times daily., Disp: , Rfl:    famotidine (PEPCID) 20 MG tablet, Take 20 mg by mouth 2 (two) times daily., Disp: , Rfl:    fluticasone (VERAMYST) 27.5 MCG/SPRAY nasal spray, Place into the nose., Disp: , Rfl:    HUMALOG KWIKPEN 100 UNIT/ML KwikPen, Inject into the skin. Sliding scale, Disp: , Rfl:    insulin detemir (LEVEMIR FLEXPEN) 100 UNIT/ML FlexPen, Inject 8 Units into the skin at bedtime., Disp: , Rfl:    losartan (COZAAR) 100 MG  tablet, Take 1 tablet (100 mg total) by mouth daily., Disp: 90 tablet, Rfl: 1   metFORMIN (GLUCOPHAGE) 500 MG tablet, Take 1 tablet (500 mg total) by mouth 2 (two) times daily. (Patient taking differently: Take 500 mg by mouth 2 (two) times daily with a meal. Pt taking 500 mg in AM and 520m at night- endocrinology), Disp: 180 tablet, Rfl: 6   montelukast (SINGULAIR) 10 MG tablet, TAKE 1 TABLET BY MOUTH AT BEDTIME, Disp: 90 tablet, Rfl: 2   Omega 3 1000 MG CAPS, Take 1 capsule (1,000 mg total) by mouth daily., Disp: , Rfl:    RELION PEN NEEDLES 32G X 4 MM MISC, , Disp: , Rfl:    sertraline (ZOLOFT) 25 MG tablet, Take 1 tablet (25 mg total) by mouth daily., Disp: 90 tablet, Rfl: 0   spironolactone (ALDACTONE) 50 MG tablet, Take 1 tablet by mouth daily., Disp: , Rfl:    triamcinolone (NASACORT) 55 MCG/ACT AERO nasal inhaler, Place 2 sprays into the nose daily., Disp: 1 each, Rfl: 12   Medications ordered in this encounter:  No orders of the defined types were placed in this encounter.    *If you need refills on other medications prior to your next appointment, please contact your pharmacy*  Follow-Up: Call back or seek an in-person evaluation if the symptoms worsen or if the condition fails to improve as anticipated.  [facility]([phone] Other Instructions  Hypotension  As the heart beats, it forces blood through the body. Hypotension, commonly called low blood pressure, is when the force of blood pumping through the arteries is too weak. Arteries are blood vessels that carry blood from the heart throughout the body. Depending on the cause and severity, hypotension may be harmless (benign) or may cause serious problems (be critical). When your blood pressure is too low, you may not get enough blood to your brain or to the rest of your organs. This can cause weakness, light-headedness, a rapid heartbeat, and fainting. What are the causes? This condition may be caused  by: Blood loss. Loss of body fluids (dehydration). Heart problems. Hormone (endocrine) problems. Pregnancy. Severe infection. Lack of certain nutrients. Severe allergic reactions (anaphylaxis). Certain medicines, such as blood pressure medicine or medicines that make the body lose excess fluids (diuretics). Sometimes, hypotension may be caused by not taking medicine as directed, such as taking too much of a certain medicine. What increases the risk? The following factors may make you more likely to develop this condition: Age. Risk increases as you get older. Having a condition that affects the heart or the central nervous system. What are the signs or symptoms? Common symptoms of this condition include: Weakness. Light-headedness. Dizziness. Blurred vision. Tiredness (fatigue). Rapid heartbeat. Fainting, in severe cases. How is this diagnosed? This condition is diagnosed based on: Your medical history. Your symptoms. Your blood pressure measurement. Your health care provider will check your blood pressure when you are: Lying down. Sitting. Standing. A blood pressure reading is recorded as two numbers, such as "120 over 80" (or 120/80). The first ("top") number is called the systolic pressure. It is a measure of the pressure in your arteries as your heart beats. The second ("bottom") number is called the diastolic pressure. It is a measure of the pressure in your arteries when your heart relaxes between beats. Blood pressure is measured in a unit called mm Hg. Healthy blood pressure for most adults is 120/80. If your blood pressure is below 90/60, you may be diagnosed with hypotension. Other information or tests that may be used to diagnose hypotension include: Your other vital signs, such as your heart rate and temperature. Blood tests. Tilt table test. For this test, you will be safely secured to a table that moves you from a lying position to an upright position. Your heart  rhythm and blood pressure will be monitored during the test. How is this treated? Treatment for this condition may include: Changing your diet. This may involve drinking more water or increasing your salt (sodium) intake with high-sodium foods. Taking medicines to raise your blood pressure. Changing the dosage of certain medicines you are taking that might be lowering your blood pressure. Wearing compression stockings. These stockings help to prevent blood clots and reduce swelling in your legs. In some cases, you may need to go to the hospital for: Fluid replacement. This means you will receive fluids through an IV. Blood replacement. This means you will receive donated blood through an IV (transfusion). Treating an infection or heart problems, if this applies. Monitoring. You may need to be monitored while medicines that you are taking wear off. Follow these instructions at home: Eating and drinking  Drink enough fluid to keep your urine pale yellow. Eat a healthy diet, and follow instructions from your health care provider about eating or drinking restrictions. A healthy diet includes: Fresh fruits and vegetables. Whole grains. Lean meats. Low-fat dairy products. Increase your salt intake if told  to do so. Do not add extra salt to your diet unless your health care provider tells you to do that. Eat frequent, small meals. Avoid standing up suddenly after eating. Medicines Take over-the-counter and prescription medicines only as told by your health care provider. Follow instructions from your health care provider about changing the dosage of your current medicines, if this applies. Do not stop or adjust any of your medicines on your own. General instructions  Wear compression stockings as told by your health care provider. Get up slowly from lying down or sitting positions. This gives your blood pressure a chance to adjust. Avoid hot showers and excessive heat as directed by your  health care provider. Return to your normal activities as told by your health care provider. Ask your health care provider what activities are safe for you. Do not use any products that contain nicotine or tobacco. These products include cigarettes, chewing tobacco, and vaping devices, such as e-cigarettes. If you need help quitting, ask your health care provider. Keep all follow-up visits. This is important. Contact a health care provider if: You vomit. You have diarrhea. You have a fever for more than 2-3 days. You feel more thirsty than usual. You feel weak and tired. Get help right away if: You have chest pain. You have a fast or irregular heartbeat. You develop numbness in any part of your body. You cannot move your arms or your legs. You have trouble speaking. You become sweaty or feel light-headed. You faint. You feel short of breath. You have trouble staying awake. You feel confused. These symptoms may be an emergency. Get help right away. Call 911. Do not wait to see if the symptoms will go away. Do not drive yourself to the hospital. Summary Hypotension is when the force of blood pumping through the arteries is too weak. Hypotension may be harmless (benign) or may cause serious problems (be critical). Treatment for this condition may include changing your diet, changing your medicines, and wearing compression stockings. In some cases, you may need to go to the hospital for fluid or blood replacement. This information is not intended to replace advice given to you by your health care provider. Make sure you discuss any questions you have with your health care provider. Document Revised: 04/14/2021 Document Reviewed: 04/14/2021 Elsevier Patient Education  [place].    If you have been instructed to have an in-person evaluation today at a local Urgent Care facility, please use the link below. It will take you to a list of all of our available [facility], including address, phone number and hours of operation. Please do not delay care.  [facility]  If you or a family member do not have a primary care provider, use the link below to schedule a visit and establish care. When you choose a [facility] primary care physician or advanced practice provider, you gain a long-term partner in health. Find a Primary Care Provider  Learn more about [facility]'s in-office and virtual care options: [facility]

## 2022-08-28 NOTE — Progress Notes (Signed)
Virtual Visit Consent   Jill Guzman, you are scheduled for a virtual visit with a [facility] provider today. Just as with appointments in the office, your consent must be obtained to participate. Your consent will be active for this visit and any virtual visit you may have with one of our providers in the next 365 days. If you have a MyChart account, a copy of this consent can be sent to you electronically.  As this is a virtual visit, video technology does not allow for your provider to perform a traditional examination. This may limit your provider's ability to fully assess your condition. If your provider identifies any concerns that need to be evaluated in person or the need to arrange testing (such as labs, EKG, etc.), we will make arrangements to do so. Although advances in technology are sophisticated, we cannot ensure that it will always work on either your end or our end. If the connection with a video visit is poor, the visit may have to be switched to a telephone visit. With either a video or telephone visit, we are not always able to ensure that we have a secure connection.  By engaging in this virtual visit, you consent to the provision of healthcare and authorize for your insurance to be billed (if applicable) for the services provided during this visit. Depending on your insurance coverage, you may receive a charge related to this service.  I need to obtain your verbal consent now. Are you willing to proceed with your visit today? Jill Guzman has provided verbal consent on 08/28/2022 for a virtual visit (video or telephone). Mar Daring, PA-C  Date: 08/28/2022 8:04 PM  Virtual Visit via Video Note   I, Mar Daring, connected with  Jill Guzman  ([id number], 11-13-1940) on 08/28/22 at  7:15 PM EST by a video-enabled telemedicine application and verified that I am speaking with the correct person using two identifiers. Jill Guzman, daughter, helped her  with appointment.  Location: Patient: Virtual Visit Location Patient: Home Provider: Virtual Visit Location Provider: Home Office   I discussed the limitations of evaluation and management by telemedicine and the availability of in person appointments. The patient expressed understanding and agreed to proceed.    History of Present Illness: Jill Guzman is a 81 y.o. who identifies as a female who was assigned female at birth, and is being seen today for hypotension. BP has been running in the 80s/50s. 88/54 yesterday. She does report intermittent dizziness. Denies any falls, pre-syncope, or syncopal episodes. Currently on Amlodipine 2.5 mg and Losartan 100 mg daily. Has Spironolactone 50 mg daily as well.   Was seen in office on 08/24/22 and BP at that time was 122/62 HR 87. Did have a low BP on 08/18/22 at 90/52. This was during a hospitalization for dyspnea secondary to RSV. She was hospitalized from 08/18/22 - 08/23/22. At this time hypotension was felt to be secondary to volume depletion. She was given 1L bolus and BP improved, however medications were held until discharge.   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   Clinical depression 12/31/2014   AB (asthmatic bronchitis) 12/31/2014   Routine general medical examination at a health care facility 12/31/2014   Diabetes ([place]) 12/31/2014   BP (high blood pressure) 12/31/2014   Osteopenia 12/31/2014    Allergies:   Allergies  Allergen Reactions   Penicillins Itching   Sulfa Antibiotics Itching   Iodinated Contrast Media Itching    Redness and itchiness   Medications:  Current Outpatient Medications:    acetaminophen (TYLENOL) 500 MG tablet, Take by mouth., Disp: , Rfl:    albuterol (VENTOLIN HFA) 108 (90 Base) MCG/ACT inhaler, INHALE 1 PUFF BY MOUTH 4 TIMES DAILY, Disp: 9 g, Rfl: 0   atorvastatin (LIPITOR) 10 MG tablet, Take 1 tablet (10 mg total) by mouth daily., Disp: 90 tablet, Rfl: 1   Blood Glucose Monitoring Suppl (ACCU-CHEK GUIDE ME) w/Device KIT, Test once daily, Disp: 1 kit, Rfl: 0   budesonide-formoterol (SYMBICORT) 80-4.5 MCG/ACT inhaler, Inhale 2 puffs into the lungs 2 (two) times daily., Disp: 1 each, Rfl: 12   cetirizine (ZYRTEC) 10 MG tablet, Take 1 tablet by mouth once daily, Disp: 30 tablet, Rfl: 0   Cholecalciferol (VITAMIN D) 50 MCG (2000 UT) CAPS, Take by mouth., Disp: , Rfl:    dexamethasone (DECADRON) 4 MG tablet, Take 1 tablet by mouth 2 (two) times daily., Disp: , Rfl:    famotidine (PEPCID) 20 MG tablet, Take 20 mg by mouth 2 (two) times daily., Disp: , Rfl:    fluticasone (VERAMYST) 27.5 MCG/SPRAY nasal spray, Place into the nose., Disp: , Rfl:    HUMALOG KWIKPEN 100 UNIT/ML KwikPen, Inject into the skin. Sliding scale, Disp: , Rfl:    insulin detemir (LEVEMIR FLEXPEN) 100 UNIT/ML FlexPen, Inject 8 Units into the skin at bedtime., Disp: , Rfl:    losartan (COZAAR) 100 MG tablet, Take 1 tablet (100 mg total) by mouth daily., Disp: 90 tablet, Rfl: 1   metFORMIN (GLUCOPHAGE) 500 MG tablet, Take 1 tablet (500 mg total) by mouth 2 (two) times daily. (Patient taking differently: Take 500 mg by mouth 2 (two) times daily with a meal. Pt taking 500 mg in AM and 565m at night- endocrinology), Disp: 180 tablet, Rfl: 6   montelukast (SINGULAIR) 10 MG tablet, TAKE 1 TABLET BY MOUTH AT BEDTIME, Disp: 90 tablet, Rfl: 2   Omega 3 1000 MG CAPS, Take 1 capsule (1,000 mg total) by mouth  daily., Disp: , Rfl:    RELION PEN NEEDLES 32G X 4 MM MISC, , Disp: , Rfl:    sertraline (ZOLOFT) 25 MG tablet, Take 1 tablet (25 mg total) by mouth daily., Disp: 90 tablet, Rfl: 0   spironolactone (ALDACTONE) 50 MG tablet, Take 1 tablet by mouth daily., Disp: , Rfl:    triamcinolone (NASACORT) 55 MCG/ACT AERO nasal inhaler, Place 2 sprays into the nose daily., Disp: 1 each, Rfl: 12  Observations/Objective: Patient is well-developed, well-nourished in no acute distress.  Resting comfortably at home.  Head is normocephalic, atraumatic.  No labored breathing.  Speech is clear and coherent with logical content.  Patient is alert and oriented at baseline.    Assessment and Plan: 1. Hypotension due to drugs  - STOP amlodipine - Continue Losartan and Spironolactone - Recheck blood pressure and monitor closely - If still low (less than 100/60) by Monday or Tuesday to follow up with PCP or [place] next week for further medication management   Follow Up Instructions: I discussed the assessment and  treatment plan with the patient. The patient was provided an opportunity to ask questions and all were answered. The patient agreed with the plan and demonstrated an understanding of the instructions.  A copy of instructions were sent to the patient via MyChart unless otherwise noted below.    The patient was advised to call back or seek an in-person evaluation if the symptoms worsen or if the condition fails to improve as anticipated.  Time:  I spent 18 minutes with the patient via telehealth technology discussing the above problems/concerns.    Mar Daring, PA-C

## 2022-08-30 DIAGNOSIS — Z801 Family history of malignant neoplasm of trachea, bronchus and lung: Secondary | ICD-10-CM | POA: Diagnosis not present

## 2022-08-30 DIAGNOSIS — Z794 Long term (current) use of insulin: Secondary | ICD-10-CM | POA: Diagnosis not present

## 2022-08-30 DIAGNOSIS — J449 Chronic obstructive pulmonary disease, unspecified: Secondary | ICD-10-CM | POA: Diagnosis not present

## 2022-08-30 DIAGNOSIS — Z79899 Other long term (current) drug therapy: Secondary | ICD-10-CM | POA: Diagnosis not present

## 2022-08-30 DIAGNOSIS — Z7951 Long term (current) use of inhaled steroids: Secondary | ICD-10-CM | POA: Diagnosis not present

## 2022-08-30 DIAGNOSIS — C3412 Malignant neoplasm of upper lobe, left bronchus or lung: Secondary | ICD-10-CM | POA: Diagnosis not present

## 2022-08-30 DIAGNOSIS — Z7984 Long term (current) use of oral hypoglycemic drugs: Secondary | ICD-10-CM | POA: Diagnosis not present

## 2022-08-30 DIAGNOSIS — Z510 Encounter for antineoplastic radiation therapy: Secondary | ICD-10-CM | POA: Diagnosis not present

## 2022-08-30 DIAGNOSIS — I10 Essential (primary) hypertension: Secondary | ICD-10-CM | POA: Diagnosis not present

## 2022-08-30 DIAGNOSIS — E7849 Other hyperlipidemia: Secondary | ICD-10-CM | POA: Diagnosis not present

## 2022-08-30 DIAGNOSIS — C7931 Secondary malignant neoplasm of brain: Secondary | ICD-10-CM | POA: Diagnosis not present

## 2022-08-30 DIAGNOSIS — E119 Type 2 diabetes mellitus without complications: Secondary | ICD-10-CM | POA: Diagnosis not present

## 2022-09-01 ENCOUNTER — Telehealth: Payer: Self-pay

## 2022-09-01 DIAGNOSIS — C7931 Secondary malignant neoplasm of brain: Secondary | ICD-10-CM | POA: Diagnosis not present

## 2022-09-01 DIAGNOSIS — Z7951 Long term (current) use of inhaled steroids: Secondary | ICD-10-CM | POA: Diagnosis not present

## 2022-09-01 DIAGNOSIS — J449 Chronic obstructive pulmonary disease, unspecified: Secondary | ICD-10-CM | POA: Diagnosis not present

## 2022-09-01 DIAGNOSIS — Z510 Encounter for antineoplastic radiation therapy: Secondary | ICD-10-CM | POA: Diagnosis not present

## 2022-09-01 DIAGNOSIS — Z794 Long term (current) use of insulin: Secondary | ICD-10-CM | POA: Diagnosis not present

## 2022-09-01 DIAGNOSIS — Z7984 Long term (current) use of oral hypoglycemic drugs: Secondary | ICD-10-CM | POA: Diagnosis not present

## 2022-09-01 DIAGNOSIS — C3412 Malignant neoplasm of upper lobe, left bronchus or lung: Secondary | ICD-10-CM | POA: Diagnosis not present

## 2022-09-01 DIAGNOSIS — E119 Type 2 diabetes mellitus without complications: Secondary | ICD-10-CM | POA: Diagnosis not present

## 2022-09-01 DIAGNOSIS — I10 Essential (primary) hypertension: Secondary | ICD-10-CM | POA: Diagnosis not present

## 2022-09-01 DIAGNOSIS — E7849 Other hyperlipidemia: Secondary | ICD-10-CM | POA: Diagnosis not present

## 2022-09-01 DIAGNOSIS — Z79899 Other long term (current) drug therapy: Secondary | ICD-10-CM | POA: Diagnosis not present

## 2022-09-01 DIAGNOSIS — Z801 Family history of malignant neoplasm of trachea, bronchus and lung: Secondary | ICD-10-CM | POA: Diagnosis not present

## 2022-09-01 NOTE — Telephone Encounter (Signed)
   Telephone encounter was:  Successful.  09/01/2022 Name: [name] MRN: [id number] DOB: 1941/03/19  [name] is a 81 y.o. year old female who is a primary care patient of Juline Patch, MD . The community resource team was consulted for assistance with [place] guide performed the following interventions: Patient provided with information about care guide support team and interviewed to confirm resource needs.Completed call to insurance for her moms meals and will be delivered on Friday   Follow Up Plan:  No further follow up planned at this time. The patient has been provided with needed resources.    [facility], Care Management  [phone] [address], [place], [place] [address] Phone: [phone] Email: [email or web address]

## 2022-09-02 DIAGNOSIS — I10 Essential (primary) hypertension: Secondary | ICD-10-CM | POA: Diagnosis not present

## 2022-09-02 DIAGNOSIS — Z801 Family history of malignant neoplasm of trachea, bronchus and lung: Secondary | ICD-10-CM | POA: Diagnosis not present

## 2022-09-02 DIAGNOSIS — E7849 Other hyperlipidemia: Secondary | ICD-10-CM | POA: Diagnosis not present

## 2022-09-02 DIAGNOSIS — C3412 Malignant neoplasm of upper lobe, left bronchus or lung: Secondary | ICD-10-CM | POA: Diagnosis not present

## 2022-09-02 DIAGNOSIS — Z794 Long term (current) use of insulin: Secondary | ICD-10-CM | POA: Diagnosis not present

## 2022-09-02 DIAGNOSIS — Z510 Encounter for antineoplastic radiation therapy: Secondary | ICD-10-CM | POA: Diagnosis not present

## 2022-09-02 DIAGNOSIS — C7931 Secondary malignant neoplasm of brain: Secondary | ICD-10-CM | POA: Diagnosis not present

## 2022-09-02 DIAGNOSIS — Z7984 Long term (current) use of oral hypoglycemic drugs: Secondary | ICD-10-CM | POA: Diagnosis not present

## 2022-09-02 DIAGNOSIS — E119 Type 2 diabetes mellitus without complications: Secondary | ICD-10-CM | POA: Diagnosis not present

## 2022-09-02 DIAGNOSIS — Z79899 Other long term (current) drug therapy: Secondary | ICD-10-CM | POA: Diagnosis not present

## 2022-09-02 DIAGNOSIS — J449 Chronic obstructive pulmonary disease, unspecified: Secondary | ICD-10-CM | POA: Diagnosis not present

## 2022-09-02 DIAGNOSIS — Z7951 Long term (current) use of inhaled steroids: Secondary | ICD-10-CM | POA: Diagnosis not present

## 2022-09-03 DIAGNOSIS — C3412 Malignant neoplasm of upper lobe, left bronchus or lung: Secondary | ICD-10-CM | POA: Diagnosis not present

## 2022-09-03 DIAGNOSIS — E119 Type 2 diabetes mellitus without complications: Secondary | ICD-10-CM | POA: Diagnosis not present

## 2022-09-03 DIAGNOSIS — Z794 Long term (current) use of insulin: Secondary | ICD-10-CM | POA: Diagnosis not present

## 2022-09-03 DIAGNOSIS — J449 Chronic obstructive pulmonary disease, unspecified: Secondary | ICD-10-CM | POA: Diagnosis not present

## 2022-09-03 DIAGNOSIS — Z7984 Long term (current) use of oral hypoglycemic drugs: Secondary | ICD-10-CM | POA: Diagnosis not present

## 2022-09-03 DIAGNOSIS — Z801 Family history of malignant neoplasm of trachea, bronchus and lung: Secondary | ICD-10-CM | POA: Diagnosis not present

## 2022-09-03 DIAGNOSIS — Z510 Encounter for antineoplastic radiation therapy: Secondary | ICD-10-CM | POA: Diagnosis not present

## 2022-09-03 DIAGNOSIS — Z79899 Other long term (current) drug therapy: Secondary | ICD-10-CM | POA: Diagnosis not present

## 2022-09-03 DIAGNOSIS — E7849 Other hyperlipidemia: Secondary | ICD-10-CM | POA: Diagnosis not present

## 2022-09-03 DIAGNOSIS — C7931 Secondary malignant neoplasm of brain: Secondary | ICD-10-CM | POA: Diagnosis not present

## 2022-09-03 DIAGNOSIS — Z7951 Long term (current) use of inhaled steroids: Secondary | ICD-10-CM | POA: Diagnosis not present

## 2022-09-03 DIAGNOSIS — I10 Essential (primary) hypertension: Secondary | ICD-10-CM | POA: Diagnosis not present

## 2022-09-04 DIAGNOSIS — C3412 Malignant neoplasm of upper lobe, left bronchus or lung: Secondary | ICD-10-CM | POA: Diagnosis not present

## 2022-09-04 DIAGNOSIS — E7849 Other hyperlipidemia: Secondary | ICD-10-CM | POA: Diagnosis not present

## 2022-09-04 DIAGNOSIS — I959 Hypotension, unspecified: Secondary | ICD-10-CM | POA: Diagnosis not present

## 2022-09-04 DIAGNOSIS — Z794 Long term (current) use of insulin: Secondary | ICD-10-CM | POA: Diagnosis not present

## 2022-09-04 DIAGNOSIS — C3490 Malignant neoplasm of unspecified part of unspecified bronchus or lung: Secondary | ICD-10-CM | POA: Diagnosis not present

## 2022-09-04 DIAGNOSIS — J441 Chronic obstructive pulmonary disease with (acute) exacerbation: Secondary | ICD-10-CM | POA: Diagnosis not present

## 2022-09-04 DIAGNOSIS — Z79899 Other long term (current) drug therapy: Secondary | ICD-10-CM | POA: Diagnosis not present

## 2022-09-04 DIAGNOSIS — J449 Chronic obstructive pulmonary disease, unspecified: Secondary | ICD-10-CM | POA: Diagnosis not present

## 2022-09-04 DIAGNOSIS — Z7951 Long term (current) use of inhaled steroids: Secondary | ICD-10-CM | POA: Diagnosis not present

## 2022-09-04 DIAGNOSIS — E119 Type 2 diabetes mellitus without complications: Secondary | ICD-10-CM | POA: Diagnosis not present

## 2022-09-04 DIAGNOSIS — Z7984 Long term (current) use of oral hypoglycemic drugs: Secondary | ICD-10-CM | POA: Diagnosis not present

## 2022-09-04 DIAGNOSIS — I10 Essential (primary) hypertension: Secondary | ICD-10-CM | POA: Diagnosis not present

## 2022-09-04 DIAGNOSIS — Z801 Family history of malignant neoplasm of trachea, bronchus and lung: Secondary | ICD-10-CM | POA: Diagnosis not present

## 2022-09-04 DIAGNOSIS — C7931 Secondary malignant neoplasm of brain: Secondary | ICD-10-CM | POA: Diagnosis not present

## 2022-09-04 DIAGNOSIS — Z510 Encounter for antineoplastic radiation therapy: Secondary | ICD-10-CM | POA: Diagnosis not present

## 2022-09-08 DIAGNOSIS — C3490 Malignant neoplasm of unspecified part of unspecified bronchus or lung: Secondary | ICD-10-CM | POA: Diagnosis not present

## 2022-09-08 DIAGNOSIS — C7931 Secondary malignant neoplasm of brain: Secondary | ICD-10-CM | POA: Diagnosis not present

## 2022-09-08 DIAGNOSIS — Z510 Encounter for antineoplastic radiation therapy: Secondary | ICD-10-CM | POA: Diagnosis not present

## 2022-09-13 DIAGNOSIS — E119 Type 2 diabetes mellitus without complications: Secondary | ICD-10-CM | POA: Diagnosis not present

## 2022-09-15 ENCOUNTER — Ambulatory Visit (INDEPENDENT_AMBULATORY_CARE_PROVIDER_SITE_OTHER): Payer: Medicare Other | Admitting: Family Medicine

## 2022-09-15 ENCOUNTER — Encounter: Payer: Self-pay | Admitting: Family Medicine

## 2022-09-15 VITALS — BP 110/70 | HR 88 | Ht 62.0 in | Wt 116.0 lb

## 2022-09-15 DIAGNOSIS — E7849 Other hyperlipidemia: Secondary | ICD-10-CM | POA: Diagnosis not present

## 2022-09-15 DIAGNOSIS — I10 Essential (primary) hypertension: Secondary | ICD-10-CM | POA: Diagnosis not present

## 2022-09-15 DIAGNOSIS — J4520 Mild intermittent asthma, uncomplicated: Secondary | ICD-10-CM

## 2022-09-15 DIAGNOSIS — F329 Major depressive disorder, single episode, unspecified: Secondary | ICD-10-CM | POA: Diagnosis not present

## 2022-09-15 MED ORDER — ALBUTEROL SULFATE HFA 108 (90 BASE) MCG/ACT IN AERS: INHALATION_SPRAY | RESPIRATORY_TRACT | 5 refills | Status: AC

## 2022-09-15 MED ORDER — ATORVASTATIN CALCIUM 10 MG PO TABS: 10.0000 mg | ORAL_TABLET | Freq: Every day | ORAL | 1 refills | Status: DC

## 2022-09-15 MED ORDER — SPIRONOLACTONE 50 MG PO TABS: 50.0000 mg | ORAL_TABLET | Freq: Every day | ORAL | 1 refills | Status: DC

## 2022-09-15 MED ORDER — SERTRALINE HCL 50 MG PO TABS: 50.0000 mg | ORAL_TABLET | Freq: Every day | ORAL | 1 refills | Status: DC

## 2022-09-15 NOTE — Progress Notes (Signed)
Date:  09/15/2022   Name:  Jill Guzman   DOB:  Sep 19, 1940   MRN:  [id number]   Chief Complaint: Depression  Depression        This is a chronic problem.  The current episode started more than 1 month ago.   The onset quality is gradual.   The problem occurs intermittently.  The problem has been gradually improving since onset.  Associated symptoms include no fatigue, no myalgias and no headaches. Hypertension This is a chronic problem. The problem has been gradually improving since onset. The problem is controlled. Pertinent negatives include no chest pain, headaches, palpitations or shortness of breath. Past treatments include diuretics.    Lab Results  Component Value Date   NA 140 06/24/2022   K 4.4 06/24/2022   CO2 23 06/24/2022   GLUCOSE 162 (H) 06/24/2022   BUN 12 06/24/2022   CREATININE 0.80 06/24/2022   CALCIUM 10.5 (H) 06/24/2022   EGFR 75 06/24/2022   GFRNONAA >60 07/31/2020   Lab Results  Component Value Date   CHOL 191 06/24/2022   HDL 55 06/24/2022   LDLCALC 115 (H) 06/24/2022   TRIG 120 06/24/2022   CHOLHDL 2.9 03/29/2019   Lab Results  Component Value Date   TSH 1.69 06/13/2021   Lab Results  Component Value Date   HGBA1C 7.2 04/14/2022   Lab Results  Component Value Date   WBC 8.3 03/29/2019   HGB 14.1 03/29/2019   HCT 42.9 03/29/2019   MCV 85 03/29/2019   PLT 225 03/29/2019   Lab Results  Component Value Date   ALT 11 06/24/2022   AST 13 06/24/2022   ALKPHOS 61 06/24/2022   BILITOT 0.5 06/24/2022   No results found for: "25OHVITD2", "25OHVITD3", "VD25OH"   Review of Systems  Constitutional: Negative.  Negative for chills, fatigue, fever and unexpected weight change.  HENT:  Negative for congestion, ear discharge, ear pain, rhinorrhea, sinus pressure, sneezing and sore throat.   Respiratory:  Negative for cough, chest tightness, shortness of breath, wheezing and stridor.   Cardiovascular:  Negative for chest pain and palpitations.   Gastrointestinal:  Negative for abdominal pain, blood in stool, constipation, diarrhea and nausea.  Genitourinary:  Negative for dysuria, flank pain, frequency, hematuria and urgency.  Musculoskeletal:  Negative for arthralgias, back pain and myalgias.  Skin:  Negative for rash.  Neurological:  Negative for dizziness, weakness and headaches.  Hematological:  Negative for adenopathy. Does not bruise/bleed easily.  Psychiatric/Behavioral:  Positive for depression. Negative for dysphoric mood. The patient is not nervous/anxious.     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   Nonexudative age-related macular degeneration, bilateral, early dry stage 03/27/2019   Recurrent major depressive disorder, in partial remission ([place]) 02/14/2018   Chronic anxiety 02/09/2017   AB (asthmatic bronchitis), mild intermittent, uncomplicated 09/32/6712   Chronic obstructive pulmonary disease ([place]) 07/14/2016   Familial multiple lipoprotein-type hyperlipidemia 12/31/2014   Clinical depression 12/31/2014   AB (asthmatic bronchitis) 12/31/2014   Routine general medical examination at a health care facility 12/31/2014   Diabetes ([place]) 12/31/2014   BP (high blood pressure) 12/31/2014   Osteopenia 12/31/2014    Allergies  Allergen Reactions   Penicillins Itching   Sulfa Antibiotics Itching   Iodinated Contrast Media Itching    Redness and itchiness    Past Surgical History:  Procedure Laterality Date  ABDOMINAL HYSTERECTOMY     BREAST EXCISIONAL BIOPSY Left 1976   neg   CATARACT EXTRACTION W/ INTRAOCULAR LENS  IMPLANT, BILATERAL     COLONOSCOPY  2014   normal   cyst on bladder removed     cyst removed breast Left    KNEE ARTHROSCOPY WITH MEDIAL MENISECTOMY Left 10/20/2019   Procedure: KNEE ARTHROSCOPY WITH PARTIAL MEDIAL MENISECTOMY;  Surgeon: Leim Fabry, MD;  Location: [facility];  Service: Orthopedics;  Laterality: Left;  DIABETIC - oral meds   PARTIAL HYSTERECTOMY     TUBAL LIGATION      Social History   Tobacco Use   Smoking status: Former    Packs/day: 2.00    Years: 10.00    Total pack years: 20.00    Types: Cigarettes    Quit date: 05/11/1999    Years since quitting: 23.3   Smokeless tobacco: Never   Tobacco comments:    Smoking cessation materials not required  Vaping Use   Vaping Use: Never used  Substance Use Topics   Alcohol use: No    Alcohol/week: 0.0 standard drinks of alcohol   Drug use: No     Medication list has been reviewed and updated.  Current Meds  Medication Sig   acetaminophen (TYLENOL) 500 MG tablet Take by mouth.   atorvastatin (LIPITOR) 10 MG tablet Take 1 tablet (10 mg total) by mouth daily.   Blood Glucose Monitoring Suppl (ACCU-CHEK GUIDE ME) w/Device KIT Test once daily   budesonide-formoterol (SYMBICORT) 80-4.5 MCG/ACT inhaler Inhale 2 puffs into the lungs 2 (two) times daily.   Cholecalciferol (VITAMIN D) 50 MCG (2000 UT) CAPS Take by mouth.   losartan (COZAAR) 100 MG tablet Take 1 tablet (100 mg total) by mouth daily.   metFORMIN (GLUCOPHAGE) 500 MG tablet Take 1 tablet (500 mg total) by mouth 2 (two) times daily. (Patient taking differently: Take 500 mg by mouth 2 (two) times daily with a meal. Pt taking 500 mg in AM and 500mg  at night- endocrinology)   montelukast (SINGULAIR) 10 MG tablet TAKE 1 TABLET BY MOUTH AT BEDTIME   sertraline (ZOLOFT) 25 MG tablet Take 1 tablet (25 mg total) by mouth daily. (Patient taking differently: Take 50 mg by mouth daily.)   spironolactone (ALDACTONE) 50 MG tablet Take 1 tablet by mouth daily.   triamcinolone (NASACORT) 55 MCG/ACT AERO nasal inhaler Place 2 sprays into the nose daily.   [DISCONTINUED] cetirizine (ZYRTEC) 10 MG tablet Take 1 tablet by mouth once daily   [DISCONTINUED] fluticasone (VERAMYST) 27.5 MCG/SPRAY nasal spray Place into the nose.   [DISCONTINUED]  Omega 3 1000 MG CAPS Take 1 capsule (1,000 mg total) by mouth daily.       09/15/2022    9:42 AM 08/24/2022    3:57 PM 06/24/2022    8:57 AM 12/23/2021    8:25 AM  GAD 7 : Generalized Anxiety Score  Nervous, Anxious, on Edge 2 3 0 1  Control/stop worrying 0 3 0 1  Worry too much - different things 0 3 0 1  Trouble relaxing 0 1 0 1  Restless 0 1 0 0  Easily annoyed or irritable 0 2 0 2  Afraid - awful might happen 0 1 0 0  Total GAD 7 Score 2 14 0 6  Anxiety Difficulty Not difficult at all Very difficult Not difficult at all Somewhat difficult       09/15/2022    9:42 AM 08/24/2022    3:57 PM 06/24/2022  8:30 AM  Depression screen PHQ 2/9  Decreased Interest 0 1 0  Down, Depressed, Hopeless 0 1 1  PHQ - 2 Score 0 2 1  Altered sleeping 0 0 0  Tired, decreased energy 0 3 0  Change in appetite 0 0 0  Feeling bad or failure about yourself  0 0 0  Trouble concentrating 0 0 0  Moving slowly or fidgety/restless 0 0 0  Suicidal thoughts 0 0 0  PHQ-9 Score 0 5 1  Difficult doing work/chores Not difficult at all Not difficult at all Not difficult at all    BP Readings from Last 3 Encounters:  09/15/22 110/70  08/24/22 122/62  08/18/22 (!) 90/52    Physical Exam HENT:     Head: Normocephalic.     Right Ear: Tympanic membrane and ear canal normal. There is no impacted cerumen.     Left Ear: Tympanic membrane and ear canal normal. There is no impacted cerumen.     Nose: Nose normal. No congestion or rhinorrhea.     Mouth/Throat:     Mouth: Mucous membranes are moist.     Pharynx: No oropharyngeal exudate or posterior oropharyngeal erythema.  Eyes:     Conjunctiva/sclera: Conjunctivae normal.     Pupils: Pupils are equal, round, and reactive to light.  Neck:     Vascular: No carotid bruit.  Cardiovascular:     Rate and Rhythm: Normal rate and regular rhythm.     Heart sounds: No murmur heard.    No friction rub. No gallop.  Pulmonary:     Breath sounds: No wheezing,  rhonchi or rales.  Abdominal:     Tenderness: There is no abdominal tenderness.  Musculoskeletal:     Cervical back: Normal range of motion.  Neurological:     Mental Status: She is alert.     Motor: No weakness.     Wt Readings from Last 3 Encounters:  09/15/22 116 lb (52.6 kg)  08/24/22 111 lb (50.3 kg)  08/18/22 119 lb (54 kg)    BP 110/70   Pulse 88   Ht 5\' 2"  (1.575 m)   Wt 116 lb (52.6 kg)   SpO2 93%   BMI 21.22 kg/m   Assessment and Plan: 1. Reactive depression Chronic.  Controlled.  Stable.  PHQ has improved to 0 as has the GAD score improved to 0.  Patient will continue on sertraline 50 mg once a day and will recheck in 6 months. - sertraline (ZOLOFT) 50 MG tablet; Take 1 tablet (50 mg total) by mouth daily.  Dispense: 90 tablet; Refill: 1  2. Essential hypertension Chronic.  Controlled.  Stable.  Blood pressure today is 110/70.  We will continue losartan and spironolactone at current dosing and will continue with discontinuance of amlodipine.  3. Familial multiple lipoprotein-type hyperlipidemia Chronic.  Controlled.  Stable.  Continue atorvastatin 10 mg once a day.  Will check in 6 months. - atorvastatin (LIPITOR) 10 MG tablet; Take 1 tablet (10 mg total) by mouth daily.  Dispense: 90 tablet; Refill: 1  4. AB (asthmatic bronchitis), mild intermittent, uncomplicated Chronic.  Intermittent.  Mild.  Continue albuterol inhaler by nebulizer or handheld unit.  Also can encouraged to continue Symbicor 1 to 2 puffs twice a day. - albuterol (VENTOLIN HFA) 108 (90 Base) MCG/ACT inhaler; INHALE 1 PUFF BY MOUTH 4 TIMES DAILY  Dispense: 18 g; Refill: 5     Otilio Miu, MD

## 2022-09-17 DIAGNOSIS — R918 Other nonspecific abnormal finding of lung field: Secondary | ICD-10-CM | POA: Diagnosis not present

## 2022-09-17 DIAGNOSIS — C3412 Malignant neoplasm of upper lobe, left bronchus or lung: Secondary | ICD-10-CM | POA: Diagnosis not present

## 2022-09-17 DIAGNOSIS — R933 Abnormal findings on diagnostic imaging of other parts of digestive tract: Secondary | ICD-10-CM | POA: Diagnosis not present

## 2022-09-17 DIAGNOSIS — I7143 Infrarenal abdominal aortic aneurysm, without rupture: Secondary | ICD-10-CM | POA: Diagnosis not present

## 2022-09-17 DIAGNOSIS — C7931 Secondary malignant neoplasm of brain: Secondary | ICD-10-CM | POA: Diagnosis not present

## 2022-09-18 DIAGNOSIS — M810 Age-related osteoporosis without current pathological fracture: Secondary | ICD-10-CM | POA: Diagnosis not present

## 2022-09-18 DIAGNOSIS — E119 Type 2 diabetes mellitus without complications: Secondary | ICD-10-CM | POA: Diagnosis not present

## 2022-09-18 DIAGNOSIS — E785 Hyperlipidemia, unspecified: Secondary | ICD-10-CM | POA: Diagnosis not present

## 2022-09-18 DIAGNOSIS — E1169 Type 2 diabetes mellitus with other specified complication: Secondary | ICD-10-CM | POA: Diagnosis not present

## 2022-09-22 DIAGNOSIS — E119 Type 2 diabetes mellitus without complications: Secondary | ICD-10-CM | POA: Diagnosis not present

## 2022-09-22 DIAGNOSIS — M810 Age-related osteoporosis without current pathological fracture: Secondary | ICD-10-CM | POA: Diagnosis not present

## 2022-09-22 DIAGNOSIS — C7931 Secondary malignant neoplasm of brain: Secondary | ICD-10-CM | POA: Diagnosis not present

## 2022-09-22 DIAGNOSIS — G9389 Other specified disorders of brain: Secondary | ICD-10-CM | POA: Diagnosis not present

## 2022-09-22 DIAGNOSIS — C3490 Malignant neoplasm of unspecified part of unspecified bronchus or lung: Secondary | ICD-10-CM | POA: Diagnosis not present

## 2022-09-23 ENCOUNTER — Other Ambulatory Visit: Payer: Self-pay

## 2022-09-23 ENCOUNTER — Telehealth: Payer: Self-pay | Admitting: Family Medicine

## 2022-09-23 DIAGNOSIS — J449 Chronic obstructive pulmonary disease, unspecified: Secondary | ICD-10-CM

## 2022-09-23 MED ORDER — IPRATROPIUM-ALBUTEROL 0.5-2.5 (3) MG/3ML IN SOLN: 3.0000 mL | Freq: Four times a day (QID) | RESPIRATORY_TRACT | 0 refills | Status: DC | PRN

## 2022-09-23 NOTE — Telephone Encounter (Signed)
Pt's daughter called in for assistance. She says that pt had a hospital visit and a follow up with PCP. She says that PCP stated that she would send in the solution for pt's nebulizer. : Ipratropium/ Albuter Solution NEP. Would like to confirm if this has been sent in to pharmacy? Not showing in chart. Please assist further.     Pharmacy:  [facility] [address], [place] - [address] Phone: [phone]  Fax: [phone]      [phone] -

## 2022-09-23 NOTE — Progress Notes (Unsigned)
Sent in [employer]

## 2022-10-02 DIAGNOSIS — G629 Polyneuropathy, unspecified: Secondary | ICD-10-CM | POA: Diagnosis not present

## 2022-10-02 DIAGNOSIS — C7931 Secondary malignant neoplasm of brain: Secondary | ICD-10-CM | POA: Diagnosis not present

## 2022-10-02 DIAGNOSIS — C3490 Malignant neoplasm of unspecified part of unspecified bronchus or lung: Secondary | ICD-10-CM | POA: Diagnosis not present

## 2022-10-02 DIAGNOSIS — R530 Neoplastic (malignant) related fatigue: Secondary | ICD-10-CM | POA: Diagnosis not present

## 2022-10-14 DIAGNOSIS — E119 Type 2 diabetes mellitus without complications: Secondary | ICD-10-CM | POA: Diagnosis not present

## 2022-10-21 ENCOUNTER — Ambulatory Visit: Payer: Medicare Other

## 2022-10-21 DIAGNOSIS — C7931 Secondary malignant neoplasm of brain: Secondary | ICD-10-CM | POA: Diagnosis not present

## 2022-10-21 DIAGNOSIS — C3412 Malignant neoplasm of upper lobe, left bronchus or lung: Secondary | ICD-10-CM | POA: Diagnosis not present

## 2022-10-21 DIAGNOSIS — G936 Cerebral edema: Secondary | ICD-10-CM | POA: Diagnosis not present

## 2022-10-21 DIAGNOSIS — G9389 Other specified disorders of brain: Secondary | ICD-10-CM | POA: Diagnosis not present

## 2022-10-22 ENCOUNTER — Ambulatory Visit: Payer: Medicare Other

## 2022-11-09 DIAGNOSIS — C3490 Malignant neoplasm of unspecified part of unspecified bronchus or lung: Secondary | ICD-10-CM | POA: Diagnosis not present

## 2022-11-09 DIAGNOSIS — C7931 Secondary malignant neoplasm of brain: Secondary | ICD-10-CM | POA: Diagnosis not present

## 2022-11-12 DIAGNOSIS — E119 Type 2 diabetes mellitus without complications: Secondary | ICD-10-CM | POA: Diagnosis not present

## 2022-11-19 DIAGNOSIS — C7931 Secondary malignant neoplasm of brain: Secondary | ICD-10-CM | POA: Diagnosis not present

## 2022-11-19 DIAGNOSIS — G9389 Other specified disorders of brain: Secondary | ICD-10-CM | POA: Diagnosis not present

## 2022-11-19 DIAGNOSIS — M810 Age-related osteoporosis without current pathological fracture: Secondary | ICD-10-CM | POA: Diagnosis not present

## 2022-11-19 DIAGNOSIS — C3412 Malignant neoplasm of upper lobe, left bronchus or lung: Secondary | ICD-10-CM | POA: Diagnosis not present

## 2022-11-19 DIAGNOSIS — C3490 Malignant neoplasm of unspecified part of unspecified bronchus or lung: Secondary | ICD-10-CM | POA: Diagnosis not present

## 2022-11-19 DIAGNOSIS — E119 Type 2 diabetes mellitus without complications: Secondary | ICD-10-CM | POA: Diagnosis not present

## 2022-11-23 ENCOUNTER — Other Ambulatory Visit: Payer: Self-pay | Admitting: Family Medicine

## 2022-11-23 DIAGNOSIS — J449 Chronic obstructive pulmonary disease, unspecified: Secondary | ICD-10-CM

## 2022-11-24 DIAGNOSIS — R9389 Abnormal findings on diagnostic imaging of other specified body structures: Secondary | ICD-10-CM | POA: Diagnosis not present

## 2022-11-24 DIAGNOSIS — Z1159 Encounter for screening for other viral diseases: Secondary | ICD-10-CM | POA: Diagnosis not present

## 2022-11-24 DIAGNOSIS — C3490 Malignant neoplasm of unspecified part of unspecified bronchus or lung: Secondary | ICD-10-CM | POA: Diagnosis not present

## 2022-11-24 DIAGNOSIS — C7931 Secondary malignant neoplasm of brain: Secondary | ICD-10-CM | POA: Diagnosis not present

## 2022-11-24 NOTE — Telephone Encounter (Signed)
Requested Prescriptions  Pending Prescriptions Disp Refills   ipratropium-albuterol (DUONEB) 0.5-2.5 (3) MG/3ML SOLN [Pharmacy Med Name: Ipratropium-Albuterol 0.5-2.5 (3) MG/3ML Inhalation Solution] 360 mL 0    Sig: USE 1 AMPULE IN NEBULIZER EVERY 6 HOURS AS NEEDED     Pulmonology:  Combination Products - albuterol / ipratropium Passed - 11/23/2022  9:49 AM      Passed - Last BP in normal range    BP Readings from Last 1 Encounters:  09/15/22 110/70         Passed - Last Heart Rate in normal range    Pulse Readings from Last 1 Encounters:  09/15/22 88         Passed - Valid encounter within last 12 months    Recent Outpatient Visits           2 months ago Reactive depression   [facility] [place], [place], MD   3 months ago Chronic obstructive pulmonary disease, unspecified COPD type ([place])   [place] [facility] [place], Deanna C, MD   3 months ago Acute cough   [place] [facility] [place], Deanna C, MD   5 months ago Essential hypertension   [place] [facility] [place], [place], MD   11 months ago Essential hypertension   [place] [facility] [place], [place], MD       Future Appointments             In 3 months Juline Patch, MD [place] at [facility], [facility]

## 2022-12-01 DIAGNOSIS — M899 Disorder of bone, unspecified: Secondary | ICD-10-CM | POA: Diagnosis not present

## 2022-12-01 DIAGNOSIS — Z87891 Personal history of nicotine dependence: Secondary | ICD-10-CM | POA: Diagnosis not present

## 2022-12-01 DIAGNOSIS — Z5111 Encounter for antineoplastic chemotherapy: Secondary | ICD-10-CM | POA: Diagnosis not present

## 2022-12-01 DIAGNOSIS — E1165 Type 2 diabetes mellitus with hyperglycemia: Secondary | ICD-10-CM | POA: Diagnosis not present

## 2022-12-01 DIAGNOSIS — Z882 Allergy status to sulfonamides status: Secondary | ICD-10-CM | POA: Diagnosis not present

## 2022-12-01 DIAGNOSIS — R9389 Abnormal findings on diagnostic imaging of other specified body structures: Secondary | ICD-10-CM | POA: Diagnosis not present

## 2022-12-01 DIAGNOSIS — G939 Disorder of brain, unspecified: Secondary | ICD-10-CM | POA: Diagnosis not present

## 2022-12-01 DIAGNOSIS — I10 Essential (primary) hypertension: Secondary | ICD-10-CM | POA: Diagnosis not present

## 2022-12-01 DIAGNOSIS — Z79899 Other long term (current) drug therapy: Secondary | ICD-10-CM | POA: Diagnosis not present

## 2022-12-01 DIAGNOSIS — Z7984 Long term (current) use of oral hypoglycemic drugs: Secondary | ICD-10-CM | POA: Diagnosis not present

## 2022-12-01 DIAGNOSIS — Z1159 Encounter for screening for other viral diseases: Secondary | ICD-10-CM | POA: Diagnosis not present

## 2022-12-01 DIAGNOSIS — C7931 Secondary malignant neoplasm of brain: Secondary | ICD-10-CM | POA: Diagnosis not present

## 2022-12-01 DIAGNOSIS — Z794 Long term (current) use of insulin: Secondary | ICD-10-CM | POA: Diagnosis not present

## 2022-12-01 DIAGNOSIS — E7849 Other hyperlipidemia: Secondary | ICD-10-CM | POA: Diagnosis not present

## 2022-12-01 DIAGNOSIS — C3490 Malignant neoplasm of unspecified part of unspecified bronchus or lung: Secondary | ICD-10-CM | POA: Diagnosis not present

## 2022-12-01 DIAGNOSIS — Z5112 Encounter for antineoplastic immunotherapy: Secondary | ICD-10-CM | POA: Diagnosis not present

## 2022-12-03 DIAGNOSIS — C3490 Malignant neoplasm of unspecified part of unspecified bronchus or lung: Secondary | ICD-10-CM | POA: Diagnosis not present

## 2022-12-03 DIAGNOSIS — C7931 Secondary malignant neoplasm of brain: Secondary | ICD-10-CM | POA: Diagnosis not present

## 2022-12-03 DIAGNOSIS — Z452 Encounter for adjustment and management of vascular access device: Secondary | ICD-10-CM | POA: Diagnosis not present

## 2022-12-13 DIAGNOSIS — E119 Type 2 diabetes mellitus without complications: Secondary | ICD-10-CM | POA: Diagnosis not present

## 2022-12-21 ENCOUNTER — Telehealth: Payer: Self-pay | Admitting: Family Medicine

## 2022-12-21 NOTE — Telephone Encounter (Signed)
Copied from CRM [id number]. Topic: Medicare AWV >> Dec 21, 2022  3:36 PM Payton Doughty wrote: Reason for CRM: Called patient to schedule Medicare Annual Wellness Visit (AWV). Left message for patient to call back and schedule Medicare Annual Wellness Visit (AWV).  Last date of AWV: 10/20/21  Please schedule an appointment at any time with Kennedy Bucker, LPN  .  If any questions, please contact me.  Thank you ,  Verlee Rossetti; Care Guide Ambulatory Clinical Support [place] [facility] Direct Dial: [phone]

## 2022-12-21 NOTE — Telephone Encounter (Signed)
Contacted Jill Guzman to schedule their annual wellness visit. Call back at later date: 02/2023  Verlee Rossetti; Care Guide Ambulatory Clinical Support [place] [facility] Direct Dial: [phone]

## 2022-12-22 DIAGNOSIS — Z7984 Long term (current) use of oral hypoglycemic drugs: Secondary | ICD-10-CM | POA: Diagnosis not present

## 2022-12-22 DIAGNOSIS — Z1159 Encounter for screening for other viral diseases: Secondary | ICD-10-CM | POA: Diagnosis not present

## 2022-12-22 DIAGNOSIS — Z87891 Personal history of nicotine dependence: Secondary | ICD-10-CM | POA: Diagnosis not present

## 2022-12-22 DIAGNOSIS — R110 Nausea: Secondary | ICD-10-CM | POA: Diagnosis not present

## 2022-12-22 DIAGNOSIS — K1230 Oral mucositis (ulcerative), unspecified: Secondary | ICD-10-CM | POA: Diagnosis not present

## 2022-12-22 DIAGNOSIS — E7849 Other hyperlipidemia: Secondary | ICD-10-CM | POA: Diagnosis not present

## 2022-12-22 DIAGNOSIS — Z5111 Encounter for antineoplastic chemotherapy: Secondary | ICD-10-CM | POA: Diagnosis not present

## 2022-12-22 DIAGNOSIS — Z794 Long term (current) use of insulin: Secondary | ICD-10-CM | POA: Diagnosis not present

## 2022-12-22 DIAGNOSIS — R21 Rash and other nonspecific skin eruption: Secondary | ICD-10-CM | POA: Diagnosis not present

## 2022-12-22 DIAGNOSIS — R9389 Abnormal findings on diagnostic imaging of other specified body structures: Secondary | ICD-10-CM | POA: Diagnosis not present

## 2022-12-22 DIAGNOSIS — I10 Essential (primary) hypertension: Secondary | ICD-10-CM | POA: Diagnosis not present

## 2022-12-22 DIAGNOSIS — Z5112 Encounter for antineoplastic immunotherapy: Secondary | ICD-10-CM | POA: Diagnosis not present

## 2022-12-22 DIAGNOSIS — C3490 Malignant neoplasm of unspecified part of unspecified bronchus or lung: Secondary | ICD-10-CM | POA: Diagnosis not present

## 2022-12-22 DIAGNOSIS — E1165 Type 2 diabetes mellitus with hyperglycemia: Secondary | ICD-10-CM | POA: Diagnosis not present

## 2022-12-22 DIAGNOSIS — C7931 Secondary malignant neoplasm of brain: Secondary | ICD-10-CM | POA: Diagnosis not present

## 2022-12-24 ENCOUNTER — Ambulatory Visit: Payer: Medicare Other | Admitting: Family Medicine

## 2023-01-01 DIAGNOSIS — M810 Age-related osteoporosis without current pathological fracture: Secondary | ICD-10-CM | POA: Diagnosis not present

## 2023-01-01 DIAGNOSIS — E785 Hyperlipidemia, unspecified: Secondary | ICD-10-CM | POA: Diagnosis not present

## 2023-01-01 DIAGNOSIS — E119 Type 2 diabetes mellitus without complications: Secondary | ICD-10-CM | POA: Diagnosis not present

## 2023-01-01 DIAGNOSIS — E1169 Type 2 diabetes mellitus with other specified complication: Secondary | ICD-10-CM | POA: Diagnosis not present

## 2023-01-01 LAB — HM DIABETES FOOT EXAM: HM Diabetic Foot Exam: NORMAL

## 2023-01-03 ENCOUNTER — Other Ambulatory Visit: Payer: Self-pay | Admitting: Family Medicine

## 2023-01-03 DIAGNOSIS — I10 Essential (primary) hypertension: Secondary | ICD-10-CM

## 2023-01-10 DIAGNOSIS — C7931 Secondary malignant neoplasm of brain: Secondary | ICD-10-CM | POA: Diagnosis not present

## 2023-01-10 DIAGNOSIS — C3490 Malignant neoplasm of unspecified part of unspecified bronchus or lung: Secondary | ICD-10-CM | POA: Diagnosis not present

## 2023-01-10 DIAGNOSIS — R911 Solitary pulmonary nodule: Secondary | ICD-10-CM | POA: Diagnosis not present

## 2023-01-10 DIAGNOSIS — M898X8 Other specified disorders of bone, other site: Secondary | ICD-10-CM | POA: Diagnosis not present

## 2023-01-10 DIAGNOSIS — C3411 Malignant neoplasm of upper lobe, right bronchus or lung: Secondary | ICD-10-CM | POA: Diagnosis not present

## 2023-01-10 DIAGNOSIS — I719 Aortic aneurysm of unspecified site, without rupture: Secondary | ICD-10-CM | POA: Diagnosis not present

## 2023-01-10 DIAGNOSIS — I714 Abdominal aortic aneurysm, without rupture, unspecified: Secondary | ICD-10-CM | POA: Diagnosis not present

## 2023-01-12 DIAGNOSIS — Z5111 Encounter for antineoplastic chemotherapy: Secondary | ICD-10-CM | POA: Diagnosis not present

## 2023-01-12 DIAGNOSIS — Z87891 Personal history of nicotine dependence: Secondary | ICD-10-CM | POA: Diagnosis not present

## 2023-01-12 DIAGNOSIS — K1230 Oral mucositis (ulcerative), unspecified: Secondary | ICD-10-CM | POA: Diagnosis not present

## 2023-01-12 DIAGNOSIS — R9389 Abnormal findings on diagnostic imaging of other specified body structures: Secondary | ICD-10-CM | POA: Diagnosis not present

## 2023-01-12 DIAGNOSIS — Z7984 Long term (current) use of oral hypoglycemic drugs: Secondary | ICD-10-CM | POA: Diagnosis not present

## 2023-01-12 DIAGNOSIS — Z1159 Encounter for screening for other viral diseases: Secondary | ICD-10-CM | POA: Diagnosis not present

## 2023-01-12 DIAGNOSIS — Z794 Long term (current) use of insulin: Secondary | ICD-10-CM | POA: Diagnosis not present

## 2023-01-12 DIAGNOSIS — C7931 Secondary malignant neoplasm of brain: Secondary | ICD-10-CM | POA: Diagnosis not present

## 2023-01-12 DIAGNOSIS — G939 Disorder of brain, unspecified: Secondary | ICD-10-CM | POA: Diagnosis not present

## 2023-01-12 DIAGNOSIS — R112 Nausea with vomiting, unspecified: Secondary | ICD-10-CM | POA: Diagnosis not present

## 2023-01-12 DIAGNOSIS — E7849 Other hyperlipidemia: Secondary | ICD-10-CM | POA: Diagnosis not present

## 2023-01-12 DIAGNOSIS — R220 Localized swelling, mass and lump, head: Secondary | ICD-10-CM | POA: Diagnosis not present

## 2023-01-12 DIAGNOSIS — C3490 Malignant neoplasm of unspecified part of unspecified bronchus or lung: Secondary | ICD-10-CM | POA: Diagnosis not present

## 2023-01-12 DIAGNOSIS — Z5112 Encounter for antineoplastic immunotherapy: Secondary | ICD-10-CM | POA: Diagnosis not present

## 2023-01-12 DIAGNOSIS — E119 Type 2 diabetes mellitus without complications: Secondary | ICD-10-CM | POA: Diagnosis not present

## 2023-01-12 DIAGNOSIS — I10 Essential (primary) hypertension: Secondary | ICD-10-CM | POA: Diagnosis not present

## 2023-01-12 DIAGNOSIS — Z79899 Other long term (current) drug therapy: Secondary | ICD-10-CM | POA: Diagnosis not present

## 2023-01-15 DIAGNOSIS — C3490 Malignant neoplasm of unspecified part of unspecified bronchus or lung: Secondary | ICD-10-CM | POA: Diagnosis not present

## 2023-01-15 DIAGNOSIS — C3412 Malignant neoplasm of upper lobe, left bronchus or lung: Secondary | ICD-10-CM | POA: Diagnosis not present

## 2023-01-15 DIAGNOSIS — C7931 Secondary malignant neoplasm of brain: Secondary | ICD-10-CM | POA: Diagnosis not present

## 2023-01-23 DIAGNOSIS — Z20822 Contact with and (suspected) exposure to covid-19: Secondary | ICD-10-CM | POA: Diagnosis not present

## 2023-01-23 DIAGNOSIS — R0789 Other chest pain: Secondary | ICD-10-CM | POA: Diagnosis not present

## 2023-01-23 DIAGNOSIS — D849 Immunodeficiency, unspecified: Secondary | ICD-10-CM | POA: Diagnosis not present

## 2023-01-23 DIAGNOSIS — J449 Chronic obstructive pulmonary disease, unspecified: Secondary | ICD-10-CM | POA: Diagnosis not present

## 2023-01-23 DIAGNOSIS — I10 Essential (primary) hypertension: Secondary | ICD-10-CM | POA: Diagnosis not present

## 2023-01-23 DIAGNOSIS — I82562 Chronic embolism and thrombosis of left calf muscular vein: Secondary | ICD-10-CM | POA: Diagnosis not present

## 2023-01-23 DIAGNOSIS — C3490 Malignant neoplasm of unspecified part of unspecified bronchus or lung: Secondary | ICD-10-CM | POA: Diagnosis not present

## 2023-01-23 DIAGNOSIS — R531 Weakness: Secondary | ICD-10-CM | POA: Diagnosis not present

## 2023-01-23 DIAGNOSIS — R112 Nausea with vomiting, unspecified: Secondary | ICD-10-CM | POA: Diagnosis not present

## 2023-01-23 DIAGNOSIS — R21 Rash and other nonspecific skin eruption: Secondary | ICD-10-CM | POA: Diagnosis not present

## 2023-01-23 DIAGNOSIS — J441 Chronic obstructive pulmonary disease with (acute) exacerbation: Secondary | ICD-10-CM | POA: Diagnosis not present

## 2023-01-23 DIAGNOSIS — E7849 Other hyperlipidemia: Secondary | ICD-10-CM | POA: Diagnosis not present

## 2023-01-23 DIAGNOSIS — E1165 Type 2 diabetes mellitus with hyperglycemia: Secondary | ICD-10-CM | POA: Diagnosis not present

## 2023-01-23 DIAGNOSIS — D649 Anemia, unspecified: Secondary | ICD-10-CM | POA: Diagnosis not present

## 2023-01-23 DIAGNOSIS — D6859 Other primary thrombophilia: Secondary | ICD-10-CM | POA: Diagnosis not present

## 2023-01-23 DIAGNOSIS — I2699 Other pulmonary embolism without acute cor pulmonale: Secondary | ICD-10-CM | POA: Diagnosis not present

## 2023-01-23 DIAGNOSIS — C7931 Secondary malignant neoplasm of brain: Secondary | ICD-10-CM | POA: Diagnosis not present

## 2023-01-23 DIAGNOSIS — T451X5A Adverse effect of antineoplastic and immunosuppressive drugs, initial encounter: Secondary | ICD-10-CM | POA: Diagnosis not present

## 2023-01-23 DIAGNOSIS — J9601 Acute respiratory failure with hypoxia: Secondary | ICD-10-CM | POA: Diagnosis not present

## 2023-01-23 DIAGNOSIS — R509 Fever, unspecified: Secondary | ICD-10-CM | POA: Diagnosis not present

## 2023-01-23 DIAGNOSIS — Z1152 Encounter for screening for COVID-19: Secondary | ICD-10-CM | POA: Diagnosis not present

## 2023-01-23 DIAGNOSIS — Z743 Need for continuous supervision: Secondary | ICD-10-CM | POA: Diagnosis not present

## 2023-01-23 DIAGNOSIS — D72810 Lymphocytopenia: Secondary | ICD-10-CM | POA: Diagnosis not present

## 2023-01-23 DIAGNOSIS — R918 Other nonspecific abnormal finding of lung field: Secondary | ICD-10-CM | POA: Diagnosis not present

## 2023-01-23 DIAGNOSIS — Z9221 Personal history of antineoplastic chemotherapy: Secondary | ICD-10-CM | POA: Diagnosis not present

## 2023-01-23 DIAGNOSIS — R911 Solitary pulmonary nodule: Secondary | ICD-10-CM | POA: Diagnosis not present

## 2023-01-23 DIAGNOSIS — Z882 Allergy status to sulfonamides status: Secondary | ICD-10-CM | POA: Diagnosis not present

## 2023-01-23 DIAGNOSIS — D696 Thrombocytopenia, unspecified: Secondary | ICD-10-CM | POA: Diagnosis not present

## 2023-01-23 DIAGNOSIS — R6511 Systemic inflammatory response syndrome (SIRS) of non-infectious origin with acute organ dysfunction: Secondary | ICD-10-CM | POA: Diagnosis not present

## 2023-01-23 DIAGNOSIS — R109 Unspecified abdominal pain: Secondary | ICD-10-CM | POA: Diagnosis not present

## 2023-01-23 DIAGNOSIS — J811 Chronic pulmonary edema: Secondary | ICD-10-CM | POA: Diagnosis not present

## 2023-01-23 DIAGNOSIS — I071 Rheumatic tricuspid insufficiency: Secondary | ICD-10-CM | POA: Diagnosis not present

## 2023-01-23 DIAGNOSIS — R6510 Systemic inflammatory response syndrome (SIRS) of non-infectious origin without acute organ dysfunction: Secondary | ICD-10-CM | POA: Diagnosis not present

## 2023-01-23 DIAGNOSIS — R0902 Hypoxemia: Secondary | ICD-10-CM | POA: Diagnosis not present

## 2023-01-23 DIAGNOSIS — Z85118 Personal history of other malignant neoplasm of bronchus and lung: Secondary | ICD-10-CM | POA: Diagnosis not present

## 2023-01-23 DIAGNOSIS — R6889 Other general symptoms and signs: Secondary | ICD-10-CM | POA: Diagnosis not present

## 2023-01-23 DIAGNOSIS — Z452 Encounter for adjustment and management of vascular access device: Secondary | ICD-10-CM | POA: Diagnosis not present

## 2023-01-23 DIAGNOSIS — Z87891 Personal history of nicotine dependence: Secondary | ICD-10-CM | POA: Diagnosis not present

## 2023-01-23 DIAGNOSIS — L270 Generalized skin eruption due to drugs and medicaments taken internally: Secondary | ICD-10-CM | POA: Diagnosis not present

## 2023-01-23 DIAGNOSIS — Z794 Long term (current) use of insulin: Secondary | ICD-10-CM | POA: Diagnosis not present

## 2023-01-23 DIAGNOSIS — D72821 Monocytosis (symptomatic): Secondary | ICD-10-CM | POA: Diagnosis not present

## 2023-01-23 DIAGNOSIS — D509 Iron deficiency anemia, unspecified: Secondary | ICD-10-CM | POA: Diagnosis not present

## 2023-01-23 DIAGNOSIS — B965 Pseudomonas (aeruginosa) (mallei) (pseudomallei) as the cause of diseases classified elsewhere: Secondary | ICD-10-CM | POA: Diagnosis not present

## 2023-01-23 DIAGNOSIS — Z7951 Long term (current) use of inhaled steroids: Secondary | ICD-10-CM | POA: Diagnosis not present

## 2023-01-23 DIAGNOSIS — I82813 Embolism and thrombosis of superficial veins of lower extremities, bilateral: Secondary | ICD-10-CM | POA: Diagnosis not present

## 2023-01-23 DIAGNOSIS — R7881 Bacteremia: Secondary | ICD-10-CM | POA: Diagnosis not present

## 2023-01-24 DIAGNOSIS — I2694 Multiple subsegmental pulmonary emboli without acute cor pulmonale: Secondary | ICD-10-CM | POA: Insufficient documentation

## 2023-01-24 LAB — HEMOGLOBIN A1C: Hemoglobin A1C: 8.3

## 2023-01-28 DIAGNOSIS — R509 Fever, unspecified: Secondary | ICD-10-CM | POA: Diagnosis not present

## 2023-02-02 ENCOUNTER — Telehealth: Payer: Self-pay | Admitting: Family Medicine

## 2023-02-02 ENCOUNTER — Ambulatory Visit (INDEPENDENT_AMBULATORY_CARE_PROVIDER_SITE_OTHER): Payer: Medicare Other | Admitting: Vascular Surgery

## 2023-02-02 ENCOUNTER — Encounter: Payer: Self-pay | Admitting: Family Medicine

## 2023-02-02 ENCOUNTER — Encounter (INDEPENDENT_AMBULATORY_CARE_PROVIDER_SITE_OTHER): Payer: Self-pay | Admitting: Vascular Surgery

## 2023-02-02 ENCOUNTER — Other Ambulatory Visit (INDEPENDENT_AMBULATORY_CARE_PROVIDER_SITE_OTHER): Payer: Medicare Other

## 2023-02-02 ENCOUNTER — Telehealth: Payer: Self-pay

## 2023-02-02 NOTE — Telephone Encounter (Signed)
Pam from [facility] stated pt was admitted on May 18, discharged over the holiday weekend, and hospital providers ordered home health and requested that PCP follow home health PT and OT.  Pam is requesting a callback.

## 2023-02-02 NOTE — Telephone Encounter (Signed)
Spoke to [facility] concerning the need for a face to face before proceeding with orders for [facility]. Otherwise pt has been under the care of [facility] hem/onc for her care. Endo for diabetes as well. She will call me back after speaking to [facility] specialists. Call back- [phone]/ [facility]

## 2023-02-08 ENCOUNTER — Ambulatory Visit (INDEPENDENT_AMBULATORY_CARE_PROVIDER_SITE_OTHER): Payer: Medicare Other | Admitting: Family Medicine

## 2023-02-08 ENCOUNTER — Encounter: Payer: Self-pay | Admitting: Family Medicine

## 2023-02-08 VITALS — BP 114/72 | HR 80 | Ht 62.0 in | Wt 111.0 lb

## 2023-02-08 DIAGNOSIS — I10 Essential (primary) hypertension: Secondary | ICD-10-CM

## 2023-02-08 DIAGNOSIS — Z4802 Encounter for removal of sutures: Secondary | ICD-10-CM

## 2023-02-08 DIAGNOSIS — B370 Candidal stomatitis: Secondary | ICD-10-CM

## 2023-02-08 MED ORDER — NYSTATIN 100000 UNIT/ML MT SUSP: 5.0000 mL | Freq: Four times a day (QID) | OROMUCOSAL | 0 refills | Status: DC

## 2023-02-08 MED ORDER — LOSARTAN POTASSIUM 100 MG PO TABS: 100.0000 mg | ORAL_TABLET | Freq: Every day | ORAL | 1 refills | Status: DC

## 2023-02-08 NOTE — Progress Notes (Signed)
Date:  02/08/2023   Name:  Jill Guzman   DOB:  1941/06/07   MRN:  [id number]   Chief Complaint: Hospitalization Follow-up (Admitted on 5/18- d/c on 5/26 and TOC placed on 5/28- does have suture on abdomen and needed bp check/ Rosey Bath is in room with pt- okay per pt)  Hypertension This is a chronic problem. The problem has been gradually improving since onset. The problem is controlled. Pertinent negatives include no chest pain, orthopnea, palpitations, PND or shortness of breath. Risk factors for coronary artery disease include diabetes mellitus. Past treatments include angiotensin blockers (off spironolactone/amlodipine). The current treatment provides mild improvement.    Lab Results  Component Value Date   NA 140 06/24/2022   K 4.4 06/24/2022   CO2 23 06/24/2022   GLUCOSE 162 (H) 06/24/2022   BUN 12 06/24/2022   CREATININE 0.80 06/24/2022   CALCIUM 10.5 (H) 06/24/2022   EGFR 75 06/24/2022   GFRNONAA >60 07/31/2020   Lab Results  Component Value Date   CHOL 191 06/24/2022   HDL 55 06/24/2022   LDLCALC 115 (H) 06/24/2022   TRIG 120 06/24/2022   CHOLHDL 2.9 03/29/2019   Lab Results  Component Value Date   TSH 1.69 06/13/2021   Lab Results  Component Value Date   HGBA1C 7.2 04/14/2022   Lab Results  Component Value Date   WBC 8.3 03/29/2019   HGB 14.1 03/29/2019   HCT 42.9 03/29/2019   MCV 85 03/29/2019   PLT 225 03/29/2019   Lab Results  Component Value Date   ALT 11 06/24/2022   AST 13 06/24/2022   ALKPHOS 61 06/24/2022   BILITOT 0.5 06/24/2022   No results found for: "25OHVITD2", "25OHVITD3", "VD25OH"   Review of Systems  Constitutional:  Negative for chills and fever.  HENT:  Negative for congestion.   Respiratory:  Negative for cough, shortness of breath and wheezing.   Cardiovascular:  Negative for chest pain, palpitations, orthopnea and PND.  Gastrointestinal:  Negative for abdominal pain.    Patient Active Problem List   Diagnosis Date  Noted   AAA (abdominal aortic aneurysm) without rupture ([facility]) 08/09/2020   Iliac artery aneurysm ([facility]) 07/23/2020   Nonexudative age-related macular degeneration, bilateral, early dry stage 03/27/2019   Recurrent major depressive disorder, in partial remission ([facility]) 02/14/2018   Chronic anxiety 02/09/2017   AB (asthmatic bronchitis), mild intermittent, uncomplicated 02/09/2017   Chronic obstructive pulmonary disease ([facility]) 07/14/2016   Familial multiple lipoprotein-type hyperlipidemia 12/31/2014   Clinical depression 12/31/2014   AB (asthmatic bronchitis) 12/31/2014   Routine general medical examination at a health care facility 12/31/2014   Diabetes ([facility]) 12/31/2014   BP (high blood pressure) 12/31/2014   Osteopenia 12/31/2014    Allergies  Allergen Reactions   Penicillins Itching   Sulfa Antibiotics Itching   Iodinated Contrast Media Itching    Redness and itchiness    Past Surgical History:  Procedure Laterality Date   ABDOMINAL HYSTERECTOMY     BREAST EXCISIONAL BIOPSY Left 1976   neg   CATARACT EXTRACTION W/ INTRAOCULAR LENS  IMPLANT, BILATERAL     COLONOSCOPY  2014   normal   cyst on bladder removed     cyst removed breast Left    KNEE ARTHROSCOPY WITH MEDIAL MENISECTOMY Left 10/20/2019   Procedure: KNEE ARTHROSCOPY WITH PARTIAL MEDIAL MENISECTOMY;  Surgeon: Signa Kell, MD;  Location: [facility];  Service: Orthopedics;  Laterality: Left;  DIABETIC - oral meds   PARTIAL HYSTERECTOMY  TUBAL LIGATION      Social History   Tobacco Use   Smoking status: Former    Packs/day: 2.00    Years: 10.00    Additional pack years: 0.00    Total pack years: 20.00    Types: Cigarettes    Quit date: 05/11/1999    Years since quitting: 23.7   Smokeless tobacco: Never   Tobacco comments:    Smoking cessation materials not required  Vaping Use   Vaping Use: Never used  Substance Use Topics   Alcohol use: No    Alcohol/week: 0.0 standard drinks of alcohol   Drug  use: No     Medication list has been reviewed and updated.  Current Meds  Medication Sig   acetaminophen (TYLENOL) 500 MG tablet Take by mouth.   albuterol (VENTOLIN HFA) 108 (90 Base) MCG/ACT inhaler INHALE 1 PUFF BY MOUTH 4 TIMES DAILY   amLODipine (NORVASC) 2.5 MG tablet Take 2.5 mg by mouth daily.   apixaban (ELIQUIS) 5 MG TABS tablet Take 5 mg by mouth 2 (two) times daily.   atorvastatin (LIPITOR) 10 MG tablet Take 1 tablet (10 mg total) by mouth daily.   Blood Glucose Monitoring Suppl (ACCU-CHEK GUIDE ME) w/Device KIT Test once daily   budesonide-formoterol (SYMBICORT) 80-4.5 MCG/ACT inhaler Inhale 2 puffs into the lungs 2 (two) times daily.   cetirizine (ZYRTEC) 10 MG tablet Take 10 mg by mouth as needed for allergies. otc   Cholecalciferol (VITAMIN D) 50 MCG (2000 UT) CAPS Take by mouth.   dexamethasone (DECADRON) 4 MG tablet Take 2 tablets by mouth daily.   fluticasone (FLONASE) 50 MCG/ACT nasal spray Place 2 sprays into both nostrils daily.   folic acid (FOLVITE) 1 MG tablet Take 1 mg by mouth daily.   HUMALOG KWIKPEN 100 UNIT/ML KwikPen Inject into the skin. Sliding scale as needed per endo   ipratropium-albuterol (DUONEB) 0.5-2.5 (3) MG/3ML SOLN USE 1 AMPULE IN NEBULIZER EVERY 6 HOURS AS NEEDED   JANUVIA 100 MG tablet Take 1 tablet by mouth daily.   levofloxacin (LEVAQUIN) 750 MG tablet Take 750 mg by mouth daily.   losartan (COZAAR) 100 MG tablet Take 1 tablet by mouth once daily   metFORMIN (GLUCOPHAGE) 500 MG tablet Take 1 tablet (500 mg total) by mouth 2 (two) times daily. (Patient taking differently: Take 500 mg by mouth 2 (two) times daily with a meal. Pt taking 500 mg in AM and 500mg  at night- endocrinology)   montelukast (SINGULAIR) 10 MG tablet TAKE 1 TABLET BY MOUTH AT BEDTIME   OLANZapine (ZYPREXA) 5 MG tablet Take 5 mg by mouth at bedtime.   ondansetron (ZOFRAN) 8 MG tablet Take 8 mg by mouth every 8 (eight) hours as needed.   polyethylene glycol powder  (GLYCOLAX/MIRALAX) 17 GM/SCOOP powder Take 1 Container by mouth daily.   prochlorperazine (COMPAZINE) 10 MG tablet Take 10 mg by mouth every 6 (six) hours as needed.   RELION PEN NEEDLES 32G X 4 MM MISC    senna (SENOKOT) 8.6 MG tablet Take 2 tablets by mouth daily.   sertraline (ZOLOFT) 50 MG tablet Take 1 tablet (50 mg total) by mouth daily.   triamcinolone (NASACORT) 55 MCG/ACT AERO nasal inhaler Place 2 sprays into the nose daily.       02/08/2023    3:21 PM 09/15/2022    9:42 AM 08/24/2022    3:57 PM 06/24/2022    8:57 AM  GAD 7 : Generalized Anxiety Score  Nervous, Anxious, on  Edge 0 2 3 0  Control/stop worrying 2 0 3 0  Worry too much - different things 2 0 3 0  Trouble relaxing 0 0 1 0  Restless 0 0 1 0  Easily annoyed or irritable 0 0 2 0  Afraid - awful might happen 0 0 1 0  Total GAD 7 Score 4 2 14  0  Anxiety Difficulty Not difficult at all Not difficult at all Very difficult Not difficult at all       02/08/2023    3:19 PM 09/15/2022    9:42 AM 08/24/2022    3:57 PM  Depression screen PHQ 2/9  Decreased Interest 0 0 1  Down, Depressed, Hopeless 0 0 1  PHQ - 2 Score 0 0 2  Altered sleeping 0 0 0  Tired, decreased energy 1 0 3  Change in appetite 1 0 0  Feeling bad or failure about yourself  0 0 0  Trouble concentrating 0 0 0  Moving slowly or fidgety/restless 0 0 0  Suicidal thoughts 0 0 0  PHQ-9 Score 2 0 5  Difficult doing work/chores Somewhat difficult Not difficult at all Not difficult at all    BP Readings from Last 3 Encounters:  02/08/23 114/72  09/15/22 110/70  08/24/22 122/62    Physical Exam Vitals and nursing note reviewed.  HENT:     Right Ear: Tympanic membrane normal.     Left Ear: Tympanic membrane normal.     Nose: Nose normal.     Mouth/Throat:     Mouth: Mucous membranes are moist.  Cardiovascular:     Heart sounds: No murmur heard.    No friction rub. No gallop.  Pulmonary:     Breath sounds: No wheezing, rhonchi or rales.   Abdominal:     Tenderness: There is no abdominal tenderness.     Wt Readings from Last 3 Encounters:  02/08/23 111 lb (50.3 kg)  09/15/22 116 lb (52.6 kg)  08/24/22 111 lb (50.3 kg)    BP 114/72   Pulse 80   Ht 5\' 2"  (1.575 m)   Wt 111 lb (50.3 kg)   SpO2 96%   BMI 20.30 kg/m   Assessment and Plan: 1. Essential hypertension Chronic.  Controlled.  Stable.  Blood pressure 114/72.  Asymptomatic.  Tolerating medications well.  Patient will continue to hold amlodipine and spironolactone and that blood pressure is in excellent range at current dosing of losartan 100 mg once a day that we will continue.  We will recheck patient in 6 months. - losartan (COZAAR) 100 MG tablet; Take 1 tablet (100 mg total) by mouth daily.  Dispense: 90 tablet; Refill: 1  2. Oral thrush New onset.  Persistent.  Patient has erythematous sore tongue.  This is a glossitis that is possibly secondary to oral thrush given her multiple antibiotics.  We will treat with Mycostatin oral suspension.  3. Visit for suture removal Suture removal or biopsy was taken of a rash area was cleaned with alcohol suture was removed with an 11 knife blade and dressing was applied.   Elizabeth Sauer, MD

## 2023-02-09 ENCOUNTER — Other Ambulatory Visit: Payer: Self-pay | Admitting: Family Medicine

## 2023-02-09 DIAGNOSIS — C7951 Secondary malignant neoplasm of bone: Secondary | ICD-10-CM | POA: Diagnosis not present

## 2023-02-09 DIAGNOSIS — R21 Rash and other nonspecific skin eruption: Secondary | ICD-10-CM | POA: Diagnosis not present

## 2023-02-09 DIAGNOSIS — Z87891 Personal history of nicotine dependence: Secondary | ICD-10-CM | POA: Diagnosis not present

## 2023-02-09 DIAGNOSIS — Z7984 Long term (current) use of oral hypoglycemic drugs: Secondary | ICD-10-CM | POA: Diagnosis not present

## 2023-02-09 DIAGNOSIS — D649 Anemia, unspecified: Secondary | ICD-10-CM | POA: Diagnosis not present

## 2023-02-09 DIAGNOSIS — J449 Chronic obstructive pulmonary disease, unspecified: Secondary | ICD-10-CM | POA: Diagnosis not present

## 2023-02-09 DIAGNOSIS — D696 Thrombocytopenia, unspecified: Secondary | ICD-10-CM | POA: Diagnosis not present

## 2023-02-09 DIAGNOSIS — R918 Other nonspecific abnormal finding of lung field: Secondary | ICD-10-CM | POA: Diagnosis not present

## 2023-02-09 DIAGNOSIS — J9601 Acute respiratory failure with hypoxia: Secondary | ICD-10-CM | POA: Diagnosis not present

## 2023-02-09 DIAGNOSIS — T451X5A Adverse effect of antineoplastic and immunosuppressive drugs, initial encounter: Secondary | ICD-10-CM | POA: Diagnosis not present

## 2023-02-09 DIAGNOSIS — T80211A Bloodstream infection due to central venous catheter, initial encounter: Secondary | ICD-10-CM | POA: Diagnosis not present

## 2023-02-09 DIAGNOSIS — A4152 Sepsis due to Pseudomonas: Secondary | ICD-10-CM | POA: Diagnosis not present

## 2023-02-09 DIAGNOSIS — Z556 Problems related to health literacy: Secondary | ICD-10-CM | POA: Diagnosis not present

## 2023-02-09 DIAGNOSIS — E119 Type 2 diabetes mellitus without complications: Secondary | ICD-10-CM | POA: Diagnosis not present

## 2023-02-09 DIAGNOSIS — E785 Hyperlipidemia, unspecified: Secondary | ICD-10-CM | POA: Diagnosis not present

## 2023-02-09 DIAGNOSIS — C3490 Malignant neoplasm of unspecified part of unspecified bronchus or lung: Secondary | ICD-10-CM | POA: Diagnosis not present

## 2023-02-09 DIAGNOSIS — I10 Essential (primary) hypertension: Secondary | ICD-10-CM | POA: Diagnosis not present

## 2023-02-09 DIAGNOSIS — I2694 Multiple subsegmental pulmonary emboli without acute cor pulmonale: Secondary | ICD-10-CM | POA: Diagnosis not present

## 2023-02-09 DIAGNOSIS — Z794 Long term (current) use of insulin: Secondary | ICD-10-CM | POA: Diagnosis not present

## 2023-02-09 DIAGNOSIS — Z604 Social exclusion and rejection: Secondary | ICD-10-CM | POA: Diagnosis not present

## 2023-02-09 DIAGNOSIS — F32A Depression, unspecified: Secondary | ICD-10-CM | POA: Diagnosis not present

## 2023-02-09 DIAGNOSIS — Z7952 Long term (current) use of systemic steroids: Secondary | ICD-10-CM | POA: Diagnosis not present

## 2023-02-09 DIAGNOSIS — C7931 Secondary malignant neoplasm of brain: Secondary | ICD-10-CM | POA: Diagnosis not present

## 2023-02-09 DIAGNOSIS — R112 Nausea with vomiting, unspecified: Secondary | ICD-10-CM | POA: Diagnosis not present

## 2023-02-09 NOTE — Telephone Encounter (Signed)
Requested Prescriptions  Pending Prescriptions Disp Refills   ipratropium-albuterol (DUONEB) 0.5-2.5 (3) MG/3ML SOLN [Pharmacy Med Name: Ipratropium-Albuterol 0.5-2.5 (3) MG/3ML Inhalation Solution] 360 mL 0    Sig: USE 1 AMPULE IN NEBULIZER EVERY 6 HOURS AS NEEDED     Pulmonology:  Combination Products - albuterol / ipratropium Passed - 02/09/2023 11:32 AM      Passed - Last BP in normal range    BP Readings from Last 1 Encounters:  02/08/23 114/72         Passed - Last Heart Rate in normal range    Pulse Readings from Last 1 Encounters:  02/08/23 80         Passed - Valid encounter within last 12 months    Recent Outpatient Visits           Yesterday Essential hypertension   [place] [facility], MD   4 months ago Reactive depression   [place] [facility], MD   5 months ago Chronic obstructive pulmonary disease, unspecified COPD type [facility])   [place] [facility], MD   5 months ago Acute cough   [place] [facility], MD   7 months ago Essential hypertension   [place] [facility], MD       Future Appointments             In 6 months Duanne Limerick, MD [facility] at [facility], [facility]

## 2023-02-10 DIAGNOSIS — D696 Thrombocytopenia, unspecified: Secondary | ICD-10-CM | POA: Diagnosis not present

## 2023-02-10 DIAGNOSIS — C7931 Secondary malignant neoplasm of brain: Secondary | ICD-10-CM | POA: Diagnosis not present

## 2023-02-10 DIAGNOSIS — R918 Other nonspecific abnormal finding of lung field: Secondary | ICD-10-CM | POA: Diagnosis not present

## 2023-02-10 DIAGNOSIS — C3490 Malignant neoplasm of unspecified part of unspecified bronchus or lung: Secondary | ICD-10-CM | POA: Diagnosis not present

## 2023-02-10 DIAGNOSIS — G9389 Other specified disorders of brain: Secondary | ICD-10-CM | POA: Diagnosis not present

## 2023-02-10 DIAGNOSIS — D649 Anemia, unspecified: Secondary | ICD-10-CM | POA: Diagnosis not present

## 2023-02-10 DIAGNOSIS — E119 Type 2 diabetes mellitus without complications: Secondary | ICD-10-CM | POA: Diagnosis not present

## 2023-02-11 DIAGNOSIS — F32A Depression, unspecified: Secondary | ICD-10-CM | POA: Diagnosis not present

## 2023-02-11 DIAGNOSIS — Z604 Social exclusion and rejection: Secondary | ICD-10-CM | POA: Diagnosis not present

## 2023-02-11 DIAGNOSIS — J449 Chronic obstructive pulmonary disease, unspecified: Secondary | ICD-10-CM | POA: Diagnosis not present

## 2023-02-11 DIAGNOSIS — Z7952 Long term (current) use of systemic steroids: Secondary | ICD-10-CM | POA: Diagnosis not present

## 2023-02-11 DIAGNOSIS — E119 Type 2 diabetes mellitus without complications: Secondary | ICD-10-CM | POA: Diagnosis not present

## 2023-02-11 DIAGNOSIS — Z7984 Long term (current) use of oral hypoglycemic drugs: Secondary | ICD-10-CM | POA: Diagnosis not present

## 2023-02-11 DIAGNOSIS — Z87891 Personal history of nicotine dependence: Secondary | ICD-10-CM | POA: Diagnosis not present

## 2023-02-11 DIAGNOSIS — C7951 Secondary malignant neoplasm of bone: Secondary | ICD-10-CM | POA: Diagnosis not present

## 2023-02-11 DIAGNOSIS — I2694 Multiple subsegmental pulmonary emboli without acute cor pulmonale: Secondary | ICD-10-CM | POA: Diagnosis not present

## 2023-02-11 DIAGNOSIS — Z556 Problems related to health literacy: Secondary | ICD-10-CM | POA: Diagnosis not present

## 2023-02-11 DIAGNOSIS — R918 Other nonspecific abnormal finding of lung field: Secondary | ICD-10-CM | POA: Diagnosis not present

## 2023-02-11 DIAGNOSIS — R21 Rash and other nonspecific skin eruption: Secondary | ICD-10-CM | POA: Diagnosis not present

## 2023-02-11 DIAGNOSIS — C7931 Secondary malignant neoplasm of brain: Secondary | ICD-10-CM | POA: Diagnosis not present

## 2023-02-11 DIAGNOSIS — J9601 Acute respiratory failure with hypoxia: Secondary | ICD-10-CM | POA: Diagnosis not present

## 2023-02-11 DIAGNOSIS — Z794 Long term (current) use of insulin: Secondary | ICD-10-CM | POA: Diagnosis not present

## 2023-02-11 DIAGNOSIS — T80211A Bloodstream infection due to central venous catheter, initial encounter: Secondary | ICD-10-CM | POA: Diagnosis not present

## 2023-02-11 DIAGNOSIS — I10 Essential (primary) hypertension: Secondary | ICD-10-CM | POA: Diagnosis not present

## 2023-02-11 DIAGNOSIS — D696 Thrombocytopenia, unspecified: Secondary | ICD-10-CM | POA: Diagnosis not present

## 2023-02-11 DIAGNOSIS — E785 Hyperlipidemia, unspecified: Secondary | ICD-10-CM | POA: Diagnosis not present

## 2023-02-11 DIAGNOSIS — T451X5A Adverse effect of antineoplastic and immunosuppressive drugs, initial encounter: Secondary | ICD-10-CM | POA: Diagnosis not present

## 2023-02-11 DIAGNOSIS — A4152 Sepsis due to Pseudomonas: Secondary | ICD-10-CM | POA: Diagnosis not present

## 2023-02-11 DIAGNOSIS — D649 Anemia, unspecified: Secondary | ICD-10-CM | POA: Diagnosis not present

## 2023-02-11 DIAGNOSIS — R112 Nausea with vomiting, unspecified: Secondary | ICD-10-CM | POA: Diagnosis not present

## 2023-02-11 DIAGNOSIS — C3490 Malignant neoplasm of unspecified part of unspecified bronchus or lung: Secondary | ICD-10-CM | POA: Diagnosis not present

## 2023-02-12 DIAGNOSIS — E119 Type 2 diabetes mellitus without complications: Secondary | ICD-10-CM | POA: Diagnosis not present

## 2023-02-15 ENCOUNTER — Telehealth: Payer: Self-pay

## 2023-02-15 ENCOUNTER — Other Ambulatory Visit: Payer: Self-pay

## 2023-02-15 ENCOUNTER — Ambulatory Visit: Payer: Self-pay

## 2023-02-15 DIAGNOSIS — I10 Essential (primary) hypertension: Secondary | ICD-10-CM

## 2023-02-15 MED ORDER — LOSARTAN POTASSIUM 100 MG PO TABS
50.0000 mg | ORAL_TABLET | Freq: Every day | ORAL | 1 refills | Status: DC
Start: 2023-02-15 — End: 2023-06-16

## 2023-02-15 NOTE — Telephone Encounter (Signed)
Message from Aretta Nip sent at 02/15/2023  8:53 AM EDT  Summary: Mother's bp low and still taking BP High Pressure med wants to stop   Daughter, Rosey Bath calling. Just brought mother in last week, still have low BP{ and blood sugar issus. Today BP 101/50 but still on BP med? Wondering if should discontinue for now? Oncology pt. Call daughter back to advise as does not want to have visit as just had last week just to stop losartan (COZAAR) 100 MG tablet for now? Daughter Rosey Bath, [phone]         Chief Complaint: low BP  Symptoms: SBP 95-101/50-58, feels tired and occasional shaky, has h/o vertigo Frequency: since discharge  Pertinent Negatives: Patient denies lightheadedness Disposition: [] ED /[] Urgent Care (no appt availability in office) / [] Appointment(In office/virtual)/ []  [facility]/ [] Home Care/ [] Refused Recommended Disposition /[] [place] [facility]/ [x]  Follow-up with PCP Additional Notes: offered appt- Daughter refused stated she would like to know if PCP would like to give half dose of Losartan or stop all together. Please advise. Pt having low BS but was advised that Endo has contacted her this am and they are taking care of the BS component  Reason for Disposition  [1] Systolic BP 90-110 AND [2] taking blood pressure medications AND [3] dizzy, lightheaded or weak  Answer Assessment - Initial Assessment Questions 1. BLOOD PRESSURE: "What is the blood pressure?" "Did you take at least two measurements 5 minutes apart?"     101/50 2. ONSET: "When did you take your blood pressure?"     Daily and throughout the day DBP <60.  3. HOW: "How did you obtain the blood pressure?" (e.g., visiting nurse, automatic home BP monitor)     Auto BP monitor  4. HISTORY: "Do you have a history of low blood pressure?" "What is your blood pressure normally?"     101/50- does have h/o but amlodipine was dc 'd and spironolactone  5. MEDICINES: "Are you taking any medications for  blood pressure?" If Yes, ask: "Have they been changed recently?"     Losartan 6. PULSE RATE: "Do you know what your pulse rate is?"      N/a 7. OTHER SYMPTOMS: "Have you been sick recently?" "Have you had a recent injury?"     Cancer and chemo /radiation , tired and shaky  8. PREGNANCY: "Is there any chance you are pregnant?" "When was your last menstrual period?"     N/a  Protocols used: Blood Pressure - Low-A-AH

## 2023-02-15 NOTE — Telephone Encounter (Signed)
Called with cut losartan in half to 50mg  qday- spoke to Teresa/ daughter

## 2023-02-16 DIAGNOSIS — Z7952 Long term (current) use of systemic steroids: Secondary | ICD-10-CM | POA: Diagnosis not present

## 2023-02-16 DIAGNOSIS — J449 Chronic obstructive pulmonary disease, unspecified: Secondary | ICD-10-CM | POA: Diagnosis not present

## 2023-02-16 DIAGNOSIS — Z604 Social exclusion and rejection: Secondary | ICD-10-CM | POA: Diagnosis not present

## 2023-02-16 DIAGNOSIS — C3490 Malignant neoplasm of unspecified part of unspecified bronchus or lung: Secondary | ICD-10-CM | POA: Diagnosis not present

## 2023-02-16 DIAGNOSIS — D649 Anemia, unspecified: Secondary | ICD-10-CM | POA: Diagnosis not present

## 2023-02-16 DIAGNOSIS — T451X5A Adverse effect of antineoplastic and immunosuppressive drugs, initial encounter: Secondary | ICD-10-CM | POA: Diagnosis not present

## 2023-02-16 DIAGNOSIS — R918 Other nonspecific abnormal finding of lung field: Secondary | ICD-10-CM | POA: Diagnosis not present

## 2023-02-16 DIAGNOSIS — A4152 Sepsis due to Pseudomonas: Secondary | ICD-10-CM | POA: Diagnosis not present

## 2023-02-16 DIAGNOSIS — Z556 Problems related to health literacy: Secondary | ICD-10-CM | POA: Diagnosis not present

## 2023-02-16 DIAGNOSIS — E119 Type 2 diabetes mellitus without complications: Secondary | ICD-10-CM | POA: Diagnosis not present

## 2023-02-16 DIAGNOSIS — R112 Nausea with vomiting, unspecified: Secondary | ICD-10-CM | POA: Diagnosis not present

## 2023-02-16 DIAGNOSIS — I10 Essential (primary) hypertension: Secondary | ICD-10-CM | POA: Diagnosis not present

## 2023-02-16 DIAGNOSIS — R21 Rash and other nonspecific skin eruption: Secondary | ICD-10-CM | POA: Diagnosis not present

## 2023-02-16 DIAGNOSIS — Z794 Long term (current) use of insulin: Secondary | ICD-10-CM | POA: Diagnosis not present

## 2023-02-16 DIAGNOSIS — Z7984 Long term (current) use of oral hypoglycemic drugs: Secondary | ICD-10-CM | POA: Diagnosis not present

## 2023-02-16 DIAGNOSIS — J9601 Acute respiratory failure with hypoxia: Secondary | ICD-10-CM | POA: Diagnosis not present

## 2023-02-16 DIAGNOSIS — Z87891 Personal history of nicotine dependence: Secondary | ICD-10-CM | POA: Diagnosis not present

## 2023-02-16 DIAGNOSIS — C7931 Secondary malignant neoplasm of brain: Secondary | ICD-10-CM | POA: Diagnosis not present

## 2023-02-16 DIAGNOSIS — I2694 Multiple subsegmental pulmonary emboli without acute cor pulmonale: Secondary | ICD-10-CM | POA: Diagnosis not present

## 2023-02-16 DIAGNOSIS — D696 Thrombocytopenia, unspecified: Secondary | ICD-10-CM | POA: Diagnosis not present

## 2023-02-16 DIAGNOSIS — F32A Depression, unspecified: Secondary | ICD-10-CM | POA: Diagnosis not present

## 2023-02-16 DIAGNOSIS — T80211A Bloodstream infection due to central venous catheter, initial encounter: Secondary | ICD-10-CM | POA: Diagnosis not present

## 2023-02-16 DIAGNOSIS — E785 Hyperlipidemia, unspecified: Secondary | ICD-10-CM | POA: Diagnosis not present

## 2023-02-16 DIAGNOSIS — C7951 Secondary malignant neoplasm of bone: Secondary | ICD-10-CM | POA: Diagnosis not present

## 2023-02-19 ENCOUNTER — Ambulatory Visit (INDEPENDENT_AMBULATORY_CARE_PROVIDER_SITE_OTHER): Payer: Medicare Other | Admitting: Vascular Surgery

## 2023-02-19 ENCOUNTER — Ambulatory Visit (INDEPENDENT_AMBULATORY_CARE_PROVIDER_SITE_OTHER): Payer: Medicare Other | Admitting: Nurse Practitioner

## 2023-02-19 ENCOUNTER — Encounter (INDEPENDENT_AMBULATORY_CARE_PROVIDER_SITE_OTHER): Payer: Self-pay | Admitting: Nurse Practitioner

## 2023-02-19 ENCOUNTER — Ambulatory Visit (INDEPENDENT_AMBULATORY_CARE_PROVIDER_SITE_OTHER): Payer: Medicare Other

## 2023-02-19 VITALS — BP 165/76 | HR 73 | Resp 18 | Ht 62.0 in | Wt 111.0 lb

## 2023-02-19 DIAGNOSIS — Z7952 Long term (current) use of systemic steroids: Secondary | ICD-10-CM | POA: Diagnosis not present

## 2023-02-19 DIAGNOSIS — R112 Nausea with vomiting, unspecified: Secondary | ICD-10-CM | POA: Diagnosis not present

## 2023-02-19 DIAGNOSIS — E785 Hyperlipidemia, unspecified: Secondary | ICD-10-CM | POA: Diagnosis not present

## 2023-02-19 DIAGNOSIS — C3490 Malignant neoplasm of unspecified part of unspecified bronchus or lung: Secondary | ICD-10-CM | POA: Diagnosis not present

## 2023-02-19 DIAGNOSIS — T80211A Bloodstream infection due to central venous catheter, initial encounter: Secondary | ICD-10-CM | POA: Diagnosis not present

## 2023-02-19 DIAGNOSIS — E119 Type 2 diabetes mellitus without complications: Secondary | ICD-10-CM | POA: Diagnosis not present

## 2023-02-19 DIAGNOSIS — C7931 Secondary malignant neoplasm of brain: Secondary | ICD-10-CM | POA: Diagnosis not present

## 2023-02-19 DIAGNOSIS — I2694 Multiple subsegmental pulmonary emboli without acute cor pulmonale: Secondary | ICD-10-CM | POA: Diagnosis not present

## 2023-02-19 DIAGNOSIS — C7951 Secondary malignant neoplasm of bone: Secondary | ICD-10-CM | POA: Diagnosis not present

## 2023-02-19 DIAGNOSIS — I10 Essential (primary) hypertension: Secondary | ICD-10-CM

## 2023-02-19 DIAGNOSIS — Z87891 Personal history of nicotine dependence: Secondary | ICD-10-CM | POA: Diagnosis not present

## 2023-02-19 DIAGNOSIS — R918 Other nonspecific abnormal finding of lung field: Secondary | ICD-10-CM | POA: Diagnosis not present

## 2023-02-19 DIAGNOSIS — Z794 Long term (current) use of insulin: Secondary | ICD-10-CM | POA: Diagnosis not present

## 2023-02-19 DIAGNOSIS — D696 Thrombocytopenia, unspecified: Secondary | ICD-10-CM | POA: Diagnosis not present

## 2023-02-19 DIAGNOSIS — J449 Chronic obstructive pulmonary disease, unspecified: Secondary | ICD-10-CM | POA: Diagnosis not present

## 2023-02-19 DIAGNOSIS — J9601 Acute respiratory failure with hypoxia: Secondary | ICD-10-CM | POA: Diagnosis not present

## 2023-02-19 DIAGNOSIS — Z7984 Long term (current) use of oral hypoglycemic drugs: Secondary | ICD-10-CM | POA: Diagnosis not present

## 2023-02-19 DIAGNOSIS — R21 Rash and other nonspecific skin eruption: Secondary | ICD-10-CM | POA: Diagnosis not present

## 2023-02-19 DIAGNOSIS — F32A Depression, unspecified: Secondary | ICD-10-CM | POA: Diagnosis not present

## 2023-02-19 DIAGNOSIS — A4152 Sepsis due to Pseudomonas: Secondary | ICD-10-CM | POA: Diagnosis not present

## 2023-02-19 DIAGNOSIS — T451X5A Adverse effect of antineoplastic and immunosuppressive drugs, initial encounter: Secondary | ICD-10-CM | POA: Diagnosis not present

## 2023-02-19 DIAGNOSIS — I7143 Infrarenal abdominal aortic aneurysm, without rupture: Secondary | ICD-10-CM

## 2023-02-19 DIAGNOSIS — Z556 Problems related to health literacy: Secondary | ICD-10-CM | POA: Diagnosis not present

## 2023-02-19 DIAGNOSIS — Z604 Social exclusion and rejection: Secondary | ICD-10-CM | POA: Diagnosis not present

## 2023-02-19 DIAGNOSIS — D649 Anemia, unspecified: Secondary | ICD-10-CM | POA: Diagnosis not present
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
## 2023-02-22 DIAGNOSIS — Z556 Problems related to health literacy: Secondary | ICD-10-CM | POA: Diagnosis not present

## 2023-02-22 DIAGNOSIS — C7951 Secondary malignant neoplasm of bone: Secondary | ICD-10-CM | POA: Diagnosis not present

## 2023-02-22 DIAGNOSIS — F32A Depression, unspecified: Secondary | ICD-10-CM | POA: Diagnosis not present

## 2023-02-22 DIAGNOSIS — Z7952 Long term (current) use of systemic steroids: Secondary | ICD-10-CM | POA: Diagnosis not present

## 2023-02-22 DIAGNOSIS — E785 Hyperlipidemia, unspecified: Secondary | ICD-10-CM | POA: Diagnosis not present

## 2023-02-22 DIAGNOSIS — C7931 Secondary malignant neoplasm of brain: Secondary | ICD-10-CM | POA: Diagnosis not present

## 2023-02-22 DIAGNOSIS — I2694 Multiple subsegmental pulmonary emboli without acute cor pulmonale: Secondary | ICD-10-CM | POA: Diagnosis not present

## 2023-02-22 DIAGNOSIS — D696 Thrombocytopenia, unspecified: Secondary | ICD-10-CM | POA: Diagnosis not present

## 2023-02-22 DIAGNOSIS — E119 Type 2 diabetes mellitus without complications: Secondary | ICD-10-CM | POA: Diagnosis not present

## 2023-02-22 DIAGNOSIS — Z7984 Long term (current) use of oral hypoglycemic drugs: Secondary | ICD-10-CM | POA: Diagnosis not present

## 2023-02-22 DIAGNOSIS — R112 Nausea with vomiting, unspecified: Secondary | ICD-10-CM | POA: Diagnosis not present

## 2023-02-22 DIAGNOSIS — I10 Essential (primary) hypertension: Secondary | ICD-10-CM | POA: Diagnosis not present

## 2023-02-22 DIAGNOSIS — R918 Other nonspecific abnormal finding of lung field: Secondary | ICD-10-CM | POA: Diagnosis not present

## 2023-02-22 DIAGNOSIS — Z794 Long term (current) use of insulin: Secondary | ICD-10-CM | POA: Diagnosis not present

## 2023-02-22 DIAGNOSIS — J9601 Acute respiratory failure with hypoxia: Secondary | ICD-10-CM | POA: Diagnosis not present

## 2023-02-22 DIAGNOSIS — Z87891 Personal history of nicotine dependence: Secondary | ICD-10-CM | POA: Diagnosis not present

## 2023-02-22 DIAGNOSIS — T80211A Bloodstream infection due to central venous catheter, initial encounter: Secondary | ICD-10-CM | POA: Diagnosis not present

## 2023-02-22 DIAGNOSIS — D649 Anemia, unspecified: Secondary | ICD-10-CM | POA: Diagnosis not present

## 2023-02-22 DIAGNOSIS — A4152 Sepsis due to Pseudomonas: Secondary | ICD-10-CM | POA: Diagnosis not present

## 2023-02-22 DIAGNOSIS — T451X5A Adverse effect of antineoplastic and immunosuppressive drugs, initial encounter: Secondary | ICD-10-CM | POA: Diagnosis not present

## 2023-02-22 DIAGNOSIS — Z604 Social exclusion and rejection: Secondary | ICD-10-CM | POA: Diagnosis not present

## 2023-02-22 DIAGNOSIS — J449 Chronic obstructive pulmonary disease, unspecified: Secondary | ICD-10-CM | POA: Diagnosis not present

## 2023-02-22 DIAGNOSIS — R21 Rash and other nonspecific skin eruption: Secondary | ICD-10-CM | POA: Diagnosis not present

## 2023-02-22 DIAGNOSIS — C3490 Malignant neoplasm of unspecified part of unspecified bronchus or lung: Secondary | ICD-10-CM | POA: Diagnosis not present

## 2023-02-23 DIAGNOSIS — R112 Nausea with vomiting, unspecified: Secondary | ICD-10-CM | POA: Diagnosis not present

## 2023-02-23 DIAGNOSIS — Z7984 Long term (current) use of oral hypoglycemic drugs: Secondary | ICD-10-CM | POA: Diagnosis not present

## 2023-02-23 DIAGNOSIS — Z7952 Long term (current) use of systemic steroids: Secondary | ICD-10-CM | POA: Diagnosis not present

## 2023-02-23 DIAGNOSIS — Z794 Long term (current) use of insulin: Secondary | ICD-10-CM | POA: Diagnosis not present

## 2023-02-23 DIAGNOSIS — I10 Essential (primary) hypertension: Secondary | ICD-10-CM | POA: Diagnosis not present

## 2023-02-23 DIAGNOSIS — Z604 Social exclusion and rejection: Secondary | ICD-10-CM | POA: Diagnosis not present

## 2023-02-23 DIAGNOSIS — Z556 Problems related to health literacy: Secondary | ICD-10-CM | POA: Diagnosis not present

## 2023-02-23 DIAGNOSIS — E119 Type 2 diabetes mellitus without complications: Secondary | ICD-10-CM | POA: Diagnosis not present

## 2023-02-23 DIAGNOSIS — T451X5A Adverse effect of antineoplastic and immunosuppressive drugs, initial encounter: Secondary | ICD-10-CM | POA: Diagnosis not present

## 2023-02-23 DIAGNOSIS — C7951 Secondary malignant neoplasm of bone: Secondary | ICD-10-CM | POA: Diagnosis not present

## 2023-02-23 DIAGNOSIS — C3490 Malignant neoplasm of unspecified part of unspecified bronchus or lung: Secondary | ICD-10-CM | POA: Diagnosis not present

## 2023-02-23 DIAGNOSIS — D696 Thrombocytopenia, unspecified: Secondary | ICD-10-CM | POA: Diagnosis not present

## 2023-02-23 DIAGNOSIS — C7931 Secondary malignant neoplasm of brain: Secondary | ICD-10-CM | POA: Diagnosis not present

## 2023-02-23 DIAGNOSIS — D649 Anemia, unspecified: Secondary | ICD-10-CM | POA: Diagnosis not present

## 2023-02-23 DIAGNOSIS — F32A Depression, unspecified: Secondary | ICD-10-CM | POA: Diagnosis not present

## 2023-02-23 DIAGNOSIS — J449 Chronic obstructive pulmonary disease, unspecified: Secondary | ICD-10-CM | POA: Diagnosis not present

## 2023-02-23 DIAGNOSIS — T80211A Bloodstream infection due to central venous catheter, initial encounter: Secondary | ICD-10-CM | POA: Diagnosis not present

## 2023-02-23 DIAGNOSIS — A4152 Sepsis due to Pseudomonas: Secondary | ICD-10-CM | POA: Diagnosis not present

## 2023-02-23 DIAGNOSIS — I2694 Multiple subsegmental pulmonary emboli without acute cor pulmonale: Secondary | ICD-10-CM | POA: Diagnosis not present

## 2023-02-23 DIAGNOSIS — J9601 Acute respiratory failure with hypoxia: Secondary | ICD-10-CM | POA: Diagnosis not present

## 2023-02-23 DIAGNOSIS — E785 Hyperlipidemia, unspecified: Secondary | ICD-10-CM | POA: Diagnosis not present

## 2023-02-23 DIAGNOSIS — Z87891 Personal history of nicotine dependence: Secondary | ICD-10-CM | POA: Diagnosis not present

## 2023-02-23 DIAGNOSIS — R21 Rash and other nonspecific skin eruption: Secondary | ICD-10-CM | POA: Diagnosis not present

## 2023-02-23 DIAGNOSIS — R918 Other nonspecific abnormal finding of lung field: Secondary | ICD-10-CM | POA: Diagnosis not present

## 2023-02-24 DIAGNOSIS — T451X5A Adverse effect of antineoplastic and immunosuppressive drugs, initial encounter: Secondary | ICD-10-CM | POA: Diagnosis not present

## 2023-02-24 DIAGNOSIS — Z7952 Long term (current) use of systemic steroids: Secondary | ICD-10-CM | POA: Diagnosis not present

## 2023-02-24 DIAGNOSIS — R918 Other nonspecific abnormal finding of lung field: Secondary | ICD-10-CM | POA: Diagnosis not present

## 2023-02-24 DIAGNOSIS — E785 Hyperlipidemia, unspecified: Secondary | ICD-10-CM | POA: Diagnosis not present

## 2023-02-24 DIAGNOSIS — A4152 Sepsis due to Pseudomonas: Secondary | ICD-10-CM | POA: Diagnosis not present

## 2023-02-24 DIAGNOSIS — I2694 Multiple subsegmental pulmonary emboli without acute cor pulmonale: Secondary | ICD-10-CM | POA: Diagnosis not present

## 2023-02-24 DIAGNOSIS — C7931 Secondary malignant neoplasm of brain: Secondary | ICD-10-CM | POA: Diagnosis not present

## 2023-02-24 DIAGNOSIS — T80211A Bloodstream infection due to central venous catheter, initial encounter: Secondary | ICD-10-CM | POA: Diagnosis not present

## 2023-02-24 DIAGNOSIS — R112 Nausea with vomiting, unspecified: Secondary | ICD-10-CM | POA: Diagnosis not present

## 2023-02-24 DIAGNOSIS — C7951 Secondary malignant neoplasm of bone: Secondary | ICD-10-CM | POA: Diagnosis not present

## 2023-02-24 DIAGNOSIS — F32A Depression, unspecified: Secondary | ICD-10-CM | POA: Diagnosis not present

## 2023-02-24 DIAGNOSIS — Z7984 Long term (current) use of oral hypoglycemic drugs: Secondary | ICD-10-CM | POA: Diagnosis not present

## 2023-02-24 DIAGNOSIS — D696 Thrombocytopenia, unspecified: Secondary | ICD-10-CM | POA: Diagnosis not present

## 2023-02-24 DIAGNOSIS — Z87891 Personal history of nicotine dependence: Secondary | ICD-10-CM | POA: Diagnosis not present

## 2023-02-24 DIAGNOSIS — R21 Rash and other nonspecific skin eruption: Secondary | ICD-10-CM | POA: Diagnosis not present

## 2023-02-24 DIAGNOSIS — J449 Chronic obstructive pulmonary disease, unspecified: Secondary | ICD-10-CM | POA: Diagnosis not present

## 2023-02-24 DIAGNOSIS — E119 Type 2 diabetes mellitus without complications: Secondary | ICD-10-CM | POA: Diagnosis not present

## 2023-02-24 DIAGNOSIS — C3490 Malignant neoplasm of unspecified part of unspecified bronchus or lung: Secondary | ICD-10-CM | POA: Diagnosis not present

## 2023-02-24 DIAGNOSIS — Z604 Social exclusion and rejection: Secondary | ICD-10-CM | POA: Diagnosis not present

## 2023-02-24 DIAGNOSIS — D649 Anemia, unspecified: Secondary | ICD-10-CM | POA: Diagnosis not present

## 2023-02-24 DIAGNOSIS — Z556 Problems related to health literacy: Secondary | ICD-10-CM | POA: Diagnosis not present

## 2023-02-24 DIAGNOSIS — J9601 Acute respiratory failure with hypoxia: Secondary | ICD-10-CM | POA: Diagnosis not present

## 2023-02-24 DIAGNOSIS — I10 Essential (primary) hypertension: Secondary | ICD-10-CM | POA: Diagnosis not present

## 2023-02-24 DIAGNOSIS — Z794 Long term (current) use of insulin: Secondary | ICD-10-CM | POA: Diagnosis not present

## 2023-02-25 ENCOUNTER — Telehealth: Payer: Self-pay

## 2023-02-25 NOTE — Telephone Encounter (Signed)
Called patient and left VM to call back and schedule wellness visit over the phone with NURSE A.   Awaiting call back.

## 2023-02-26 ENCOUNTER — Telehealth: Payer: Self-pay | Admitting: Family Medicine

## 2023-02-26 DIAGNOSIS — R112 Nausea with vomiting, unspecified: Secondary | ICD-10-CM | POA: Diagnosis not present

## 2023-02-26 DIAGNOSIS — J449 Chronic obstructive pulmonary disease, unspecified: Secondary | ICD-10-CM | POA: Diagnosis not present

## 2023-02-26 DIAGNOSIS — F32A Depression, unspecified: Secondary | ICD-10-CM | POA: Diagnosis not present

## 2023-02-26 DIAGNOSIS — C3490 Malignant neoplasm of unspecified part of unspecified bronchus or lung: Secondary | ICD-10-CM | POA: Diagnosis not present

## 2023-02-26 DIAGNOSIS — Z604 Social exclusion and rejection: Secondary | ICD-10-CM | POA: Diagnosis not present

## 2023-02-26 DIAGNOSIS — E119 Type 2 diabetes mellitus without complications: Secondary | ICD-10-CM | POA: Diagnosis not present

## 2023-02-26 DIAGNOSIS — Z7984 Long term (current) use of oral hypoglycemic drugs: Secondary | ICD-10-CM | POA: Diagnosis not present

## 2023-02-26 DIAGNOSIS — I10 Essential (primary) hypertension: Secondary | ICD-10-CM | POA: Diagnosis not present

## 2023-02-26 DIAGNOSIS — A4152 Sepsis due to Pseudomonas: Secondary | ICD-10-CM | POA: Diagnosis not present

## 2023-02-26 DIAGNOSIS — E785 Hyperlipidemia, unspecified: Secondary | ICD-10-CM | POA: Diagnosis not present

## 2023-02-26 DIAGNOSIS — Z794 Long term (current) use of insulin: Secondary | ICD-10-CM | POA: Diagnosis not present

## 2023-02-26 DIAGNOSIS — D649 Anemia, unspecified: Secondary | ICD-10-CM | POA: Diagnosis not present

## 2023-02-26 DIAGNOSIS — R21 Rash and other nonspecific skin eruption: Secondary | ICD-10-CM | POA: Diagnosis not present

## 2023-02-26 DIAGNOSIS — D696 Thrombocytopenia, unspecified: Secondary | ICD-10-CM | POA: Diagnosis not present

## 2023-02-26 DIAGNOSIS — T451X5A Adverse effect of antineoplastic and immunosuppressive drugs, initial encounter: Secondary | ICD-10-CM | POA: Diagnosis not present

## 2023-02-26 DIAGNOSIS — T80211A Bloodstream infection due to central venous catheter, initial encounter: Secondary | ICD-10-CM | POA: Diagnosis not present

## 2023-02-26 DIAGNOSIS — C7951 Secondary malignant neoplasm of bone: Secondary | ICD-10-CM | POA: Diagnosis not present

## 2023-02-26 DIAGNOSIS — Z87891 Personal history of nicotine dependence: Secondary | ICD-10-CM | POA: Diagnosis not present

## 2023-02-26 DIAGNOSIS — R918 Other nonspecific abnormal finding of lung field: Secondary | ICD-10-CM | POA: Diagnosis not present

## 2023-02-26 DIAGNOSIS — I2694 Multiple subsegmental pulmonary emboli without acute cor pulmonale: Secondary | ICD-10-CM | POA: Diagnosis not present

## 2023-02-26 DIAGNOSIS — Z556 Problems related to health literacy: Secondary | ICD-10-CM | POA: Diagnosis not present

## 2023-02-26 DIAGNOSIS — Z7952 Long term (current) use of systemic steroids: Secondary | ICD-10-CM | POA: Diagnosis not present

## 2023-02-26 DIAGNOSIS — J9601 Acute respiratory failure with hypoxia: Secondary | ICD-10-CM | POA: Diagnosis not present

## 2023-02-26 DIAGNOSIS — C7931 Secondary malignant neoplasm of brain: Secondary | ICD-10-CM | POA: Diagnosis not present

## 2023-02-26 NOTE — Telephone Encounter (Signed)
Pts daughter is calling to report that she will drop off [place] application for a handicand decal for Dr. Yetta Barre to complete. Please advise [phone]- [phone]

## 2023-03-02 ENCOUNTER — Telehealth: Payer: Self-pay | Admitting: Family Medicine

## 2023-03-02 DIAGNOSIS — C7951 Secondary malignant neoplasm of bone: Secondary | ICD-10-CM | POA: Diagnosis not present

## 2023-03-02 DIAGNOSIS — R21 Rash and other nonspecific skin eruption: Secondary | ICD-10-CM | POA: Diagnosis not present

## 2023-03-02 DIAGNOSIS — C3490 Malignant neoplasm of unspecified part of unspecified bronchus or lung: Secondary | ICD-10-CM | POA: Diagnosis not present

## 2023-03-02 DIAGNOSIS — R918 Other nonspecific abnormal finding of lung field: Secondary | ICD-10-CM | POA: Diagnosis not present

## 2023-03-02 DIAGNOSIS — Z794 Long term (current) use of insulin: Secondary | ICD-10-CM | POA: Diagnosis not present

## 2023-03-02 DIAGNOSIS — I10 Essential (primary) hypertension: Secondary | ICD-10-CM | POA: Diagnosis not present

## 2023-03-02 DIAGNOSIS — Z604 Social exclusion and rejection: Secondary | ICD-10-CM | POA: Diagnosis not present

## 2023-03-02 DIAGNOSIS — E119 Type 2 diabetes mellitus without complications: Secondary | ICD-10-CM | POA: Diagnosis not present

## 2023-03-02 DIAGNOSIS — D696 Thrombocytopenia, unspecified: Secondary | ICD-10-CM | POA: Diagnosis not present

## 2023-03-02 DIAGNOSIS — J9601 Acute respiratory failure with hypoxia: Secondary | ICD-10-CM | POA: Diagnosis not present

## 2023-03-02 DIAGNOSIS — D649 Anemia, unspecified: Secondary | ICD-10-CM | POA: Diagnosis not present

## 2023-03-02 DIAGNOSIS — C7931 Secondary malignant neoplasm of brain: Secondary | ICD-10-CM | POA: Diagnosis not present

## 2023-03-02 DIAGNOSIS — Z556 Problems related to health literacy: Secondary | ICD-10-CM | POA: Diagnosis not present

## 2023-03-02 DIAGNOSIS — T451X5A Adverse effect of antineoplastic and immunosuppressive drugs, initial encounter: Secondary | ICD-10-CM | POA: Diagnosis not present

## 2023-03-02 DIAGNOSIS — F32A Depression, unspecified: Secondary | ICD-10-CM | POA: Diagnosis not present

## 2023-03-02 DIAGNOSIS — T80211A Bloodstream infection due to central venous catheter, initial encounter: Secondary | ICD-10-CM | POA: Diagnosis not present

## 2023-03-02 DIAGNOSIS — J449 Chronic obstructive pulmonary disease, unspecified: Secondary | ICD-10-CM | POA: Diagnosis not present

## 2023-03-02 DIAGNOSIS — A4152 Sepsis due to Pseudomonas: Secondary | ICD-10-CM | POA: Diagnosis not present

## 2023-03-02 DIAGNOSIS — Z87891 Personal history of nicotine dependence: Secondary | ICD-10-CM | POA: Diagnosis not present

## 2023-03-02 DIAGNOSIS — E785 Hyperlipidemia, unspecified: Secondary | ICD-10-CM | POA: Diagnosis not present

## 2023-03-02 DIAGNOSIS — R112 Nausea with vomiting, unspecified: Secondary | ICD-10-CM | POA: Diagnosis not present

## 2023-03-02 DIAGNOSIS — I2694 Multiple subsegmental pulmonary emboli without acute cor pulmonale: Secondary | ICD-10-CM | POA: Diagnosis not present

## 2023-03-02 DIAGNOSIS — Z7984 Long term (current) use of oral hypoglycemic drugs: Secondary | ICD-10-CM | POA: Diagnosis not present

## 2023-03-02 DIAGNOSIS — Z7952 Long term (current) use of systemic steroids: Secondary | ICD-10-CM | POA: Diagnosis not present

## 2023-03-02 NOTE — Telephone Encounter (Signed)
Copied from CRM [id number]. Topic: General - Inquiry >> Mar 02, 2023 12:17 PM Patsy Lager T wrote: Reason for CRM: Rosey Bath patients daughter clld to advise that [employer] will be sending in an order for incontinence supplies as the original order was written by the hospital provider.

## 2023-03-03 DIAGNOSIS — Z7984 Long term (current) use of oral hypoglycemic drugs: Secondary | ICD-10-CM | POA: Diagnosis not present

## 2023-03-03 DIAGNOSIS — J9691 Respiratory failure, unspecified with hypoxia: Secondary | ICD-10-CM | POA: Diagnosis not present

## 2023-03-03 DIAGNOSIS — Z882 Allergy status to sulfonamides status: Secondary | ICD-10-CM | POA: Diagnosis not present

## 2023-03-03 DIAGNOSIS — R911 Solitary pulmonary nodule: Secondary | ICD-10-CM | POA: Diagnosis not present

## 2023-03-03 DIAGNOSIS — R7881 Bacteremia: Secondary | ICD-10-CM | POA: Diagnosis not present

## 2023-03-03 DIAGNOSIS — E119 Type 2 diabetes mellitus without complications: Secondary | ICD-10-CM | POA: Diagnosis not present

## 2023-03-03 DIAGNOSIS — Z794 Long term (current) use of insulin: Secondary | ICD-10-CM | POA: Diagnosis not present

## 2023-03-03 DIAGNOSIS — J984 Other disorders of lung: Secondary | ICD-10-CM | POA: Diagnosis not present

## 2023-03-03 DIAGNOSIS — Z792 Long term (current) use of antibiotics: Secondary | ICD-10-CM | POA: Diagnosis not present

## 2023-03-03 DIAGNOSIS — C7931 Secondary malignant neoplasm of brain: Secondary | ICD-10-CM | POA: Diagnosis not present

## 2023-03-03 DIAGNOSIS — B965 Pseudomonas (aeruginosa) (mallei) (pseudomallei) as the cause of diseases classified elsewhere: Secondary | ICD-10-CM | POA: Diagnosis not present

## 2023-03-03 DIAGNOSIS — J8410 Pulmonary fibrosis, unspecified: Secondary | ICD-10-CM | POA: Diagnosis not present

## 2023-03-03 DIAGNOSIS — I2510 Atherosclerotic heart disease of native coronary artery without angina pectoris: Secondary | ICD-10-CM | POA: Diagnosis not present

## 2023-03-03 DIAGNOSIS — J9601 Acute respiratory failure with hypoxia: Secondary | ICD-10-CM | POA: Diagnosis not present

## 2023-03-03 DIAGNOSIS — Z79899 Other long term (current) drug therapy: Secondary | ICD-10-CM | POA: Diagnosis not present

## 2023-03-03 DIAGNOSIS — I10 Essential (primary) hypertension: Secondary | ICD-10-CM | POA: Diagnosis not present

## 2023-03-03 DIAGNOSIS — R918 Other nonspecific abnormal finding of lung field: Secondary | ICD-10-CM | POA: Diagnosis not present

## 2023-03-03 DIAGNOSIS — Z7951 Long term (current) use of inhaled steroids: Secondary | ICD-10-CM | POA: Diagnosis not present

## 2023-03-03 DIAGNOSIS — J441 Chronic obstructive pulmonary disease with (acute) exacerbation: Secondary | ICD-10-CM | POA: Diagnosis not present

## 2023-03-03 DIAGNOSIS — C7951 Secondary malignant neoplasm of bone: Secondary | ICD-10-CM | POA: Diagnosis not present

## 2023-03-03 DIAGNOSIS — C3490 Malignant neoplasm of unspecified part of unspecified bronchus or lung: Secondary | ICD-10-CM | POA: Diagnosis not present

## 2023-03-03 DIAGNOSIS — Z880 Allergy status to penicillin: Secondary | ICD-10-CM | POA: Diagnosis not present

## 2023-03-04 ENCOUNTER — Ambulatory Visit (INDEPENDENT_AMBULATORY_CARE_PROVIDER_SITE_OTHER): Payer: Medicare Other | Admitting: Family Medicine

## 2023-03-04 ENCOUNTER — Encounter: Payer: Self-pay | Admitting: Family Medicine

## 2023-03-04 ENCOUNTER — Telehealth: Payer: Self-pay | Admitting: Family Medicine

## 2023-03-04 VITALS — BP 118/60 | HR 74 | Ht 62.0 in | Wt 113.0 lb

## 2023-03-04 DIAGNOSIS — C3490 Malignant neoplasm of unspecified part of unspecified bronchus or lung: Secondary | ICD-10-CM | POA: Diagnosis not present

## 2023-03-04 DIAGNOSIS — T80211A Bloodstream infection due to central venous catheter, initial encounter: Secondary | ICD-10-CM | POA: Diagnosis not present

## 2023-03-04 DIAGNOSIS — Z604 Social exclusion and rejection: Secondary | ICD-10-CM | POA: Diagnosis not present

## 2023-03-04 DIAGNOSIS — J9601 Acute respiratory failure with hypoxia: Secondary | ICD-10-CM | POA: Diagnosis not present

## 2023-03-04 DIAGNOSIS — A4152 Sepsis due to Pseudomonas: Secondary | ICD-10-CM | POA: Diagnosis not present

## 2023-03-04 DIAGNOSIS — F32A Depression, unspecified: Secondary | ICD-10-CM | POA: Diagnosis not present

## 2023-03-04 DIAGNOSIS — C7931 Secondary malignant neoplasm of brain: Secondary | ICD-10-CM | POA: Diagnosis not present

## 2023-03-04 DIAGNOSIS — E119 Type 2 diabetes mellitus without complications: Secondary | ICD-10-CM | POA: Diagnosis not present

## 2023-03-04 DIAGNOSIS — D696 Thrombocytopenia, unspecified: Secondary | ICD-10-CM | POA: Diagnosis not present

## 2023-03-04 DIAGNOSIS — R918 Other nonspecific abnormal finding of lung field: Secondary | ICD-10-CM | POA: Diagnosis not present

## 2023-03-04 DIAGNOSIS — C7951 Secondary malignant neoplasm of bone: Secondary | ICD-10-CM | POA: Diagnosis not present

## 2023-03-04 DIAGNOSIS — Z556 Problems related to health literacy: Secondary | ICD-10-CM | POA: Diagnosis not present

## 2023-03-04 DIAGNOSIS — N393 Stress incontinence (female) (male): Secondary | ICD-10-CM | POA: Diagnosis not present

## 2023-03-04 DIAGNOSIS — D649 Anemia, unspecified: Secondary | ICD-10-CM | POA: Diagnosis not present

## 2023-03-04 DIAGNOSIS — Z87891 Personal history of nicotine dependence: Secondary | ICD-10-CM | POA: Diagnosis not present

## 2023-03-04 DIAGNOSIS — Z7984 Long term (current) use of oral hypoglycemic drugs: Secondary | ICD-10-CM | POA: Diagnosis not present

## 2023-03-04 DIAGNOSIS — Z7952 Long term (current) use of systemic steroids: Secondary | ICD-10-CM | POA: Diagnosis not present

## 2023-03-04 DIAGNOSIS — E785 Hyperlipidemia, unspecified: Secondary | ICD-10-CM | POA: Diagnosis not present

## 2023-03-04 DIAGNOSIS — J449 Chronic obstructive pulmonary disease, unspecified: Secondary | ICD-10-CM | POA: Diagnosis not present

## 2023-03-04 DIAGNOSIS — R112 Nausea with vomiting, unspecified: Secondary | ICD-10-CM | POA: Diagnosis not present

## 2023-03-04 DIAGNOSIS — I10 Essential (primary) hypertension: Secondary | ICD-10-CM | POA: Diagnosis not present

## 2023-03-04 DIAGNOSIS — R21 Rash and other nonspecific skin eruption: Secondary | ICD-10-CM | POA: Diagnosis not present

## 2023-03-04 DIAGNOSIS — Z794 Long term (current) use of insulin: Secondary | ICD-10-CM | POA: Diagnosis not present

## 2023-03-04 DIAGNOSIS — I2694 Multiple subsegmental pulmonary emboli without acute cor pulmonale: Secondary | ICD-10-CM | POA: Diagnosis not present

## 2023-03-04 DIAGNOSIS — T451X5A Adverse effect of antineoplastic and immunosuppressive drugs, initial encounter: Secondary | ICD-10-CM | POA: Diagnosis not present

## 2023-03-04 NOTE — Progress Notes (Signed)
Date:  03/04/2023   Name:  Jill Guzman   DOB:  [birth year]   MRN:  [id number]   Chief Complaint: Urinary Incontinence (Standing up- wets herself and "can't make it to the bathroom" without wetting herself. Coughing or sneezing causes a release of urine as well)  Female GU Problem Primary symptoms comment: urinary incontenence. This is a new problem. The current episode started more than 1 month ago. The problem occurs daily. The problem has been unchanged. Pertinent negatives include no constipation, discolored urine, dysuria, frequency, hematuria or urgency. Exacerbated by: sneeze cough movement. The treatment provided moderate relief.    Lab Results  Component Value Date   NA 140 06/24/2022   K 4.4 06/24/2022   CO2 23 06/24/2022   GLUCOSE 162 (H) 06/24/2022   BUN 12 06/24/2022   CREATININE 0.80 06/24/2022   CALCIUM 10.5 (H) 06/24/2022   EGFR 75 06/24/2022   GFRNONAA >60 07/31/2020   Lab Results  Component Value Date   CHOL 191 06/24/2022   HDL 55 06/24/2022   LDLCALC 115 (H) 06/24/2022   TRIG 120 06/24/2022   CHOLHDL 2.9 03/29/2019   Lab Results  Component Value Date   TSH 1.69 06/13/2021   Lab Results  Component Value Date   HGBA1C 8.3 01/24/2023   Lab Results  Component Value Date   WBC 8.3 03/29/2019   HGB 14.1 03/29/2019   HCT 42.9 03/29/2019   MCV 85 03/29/2019   PLT 225 03/29/2019   Lab Results  Component Value Date   ALT 11 06/24/2022   AST 13 06/24/2022   ALKPHOS 61 06/24/2022   BILITOT 0.5 06/24/2022   No results found for: "25OHVITD2", "25OHVITD3", "VD25OH"   Review of Systems  Respiratory:  Negative for chest tightness, shortness of breath and wheezing.   Cardiovascular:  Negative for chest pain, palpitations and leg swelling.  Gastrointestinal:  Negative for constipation.  Genitourinary:  Negative for dysuria, frequency, hematuria and urgency.    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   Nonexudative age-related macular degeneration, bilateral, early dry stage 03/27/2019   Recurrent major depressive disorder, in partial remission (HCC) 02/14/2018   Chronic anxiety 02/09/2017   AB (asthmatic bronchitis), mild intermittent, uncomplicated 02/09/2017   Chronic obstructive pulmonary disease (HCC) 07/14/2016   Familial multiple lipoprotein-type hyperlipidemia 12/31/2014   Clinical depression 12/31/2014   AB (asthmatic bronchitis) 12/31/2014   Routine general medical examination at a health care facility 12/31/2014   Diabetes (HCC) 12/31/2014   BP (high blood pressure) 12/31/2014   Osteopenia 12/31/2014    Allergies  Allergen Reactions   Penicillins Itching   Sulfa Antibiotics Itching   Iodinated Contrast Media Itching    Redness and itchiness    Past Surgical History:  Procedure Laterality Date   ABDOMINAL HYSTERECTOMY     BREAST EXCISIONAL BIOPSY Left 1976   neg   CATARACT EXTRACTION W/ INTRAOCULAR LENS  IMPLANT, BILATERAL     COLONOSCOPY  2014   normal   cyst on bladder removed     cyst removed breast Left    KNEE ARTHROSCOPY WITH MEDIAL MENISECTOMY Left 10/20/2019   Procedure: KNEE ARTHROSCOPY WITH PARTIAL MEDIAL MENISECTOMY;  Surgeon: Signa Kell, MD;  Location: [facility];  Service: Orthopedics;  Laterality: Left;  DIABETIC - oral meds   PARTIAL HYSTERECTOMY     TUBAL LIGATION  Social History   Tobacco Use   Smoking status: Former    Packs/day: 2.00    Years: 10.00    Additional pack years: 0.00    Total pack years: 20.00    Types: Cigarettes    Quit date: 05/11/1999    Years since quitting: 23.8   Smokeless tobacco: Never   Tobacco comments:    Smoking cessation materials not required  Vaping Use   Vaping Use: Never used  Substance Use Topics   Alcohol use: No    Alcohol/week: 0.0 standard drinks of alcohol   Drug use: No     Medication list has been  reviewed and updated.  Current Meds  Medication Sig   acetaminophen (TYLENOL) 500 MG tablet Take by mouth.   albuterol (VENTOLIN HFA) 108 (90 Base) MCG/ACT inhaler INHALE 1 PUFF BY MOUTH 4 TIMES DAILY   apixaban (ELIQUIS) 5 MG TABS tablet Take 5 mg by mouth 2 (two) times daily.   atorvastatin (LIPITOR) 10 MG tablet Take 1 tablet (10 mg total) by mouth daily.   Blood Glucose Monitoring Suppl (ACCU-CHEK GUIDE ME) w/Device KIT Test once daily   budesonide-formoterol (SYMBICORT) 80-4.5 MCG/ACT inhaler Inhale 2 puffs into the lungs 2 (two) times daily.   cetirizine (ZYRTEC) 10 MG tablet Take 10 mg by mouth as needed for allergies. otc   Cholecalciferol (VITAMIN D) 50 MCG (2000 UT) CAPS Take by mouth.   dexamethasone (DECADRON) 4 MG tablet Take 2 tablets by mouth daily.   fluticasone (FLONASE) 50 MCG/ACT nasal spray Place 2 sprays into both nostrils daily.   folic acid (FOLVITE) 1 MG tablet Take 1 mg by mouth daily.   HUMALOG KWIKPEN 100 UNIT/ML KwikPen Inject into the skin. Sliding scale as needed per endo   ipratropium-albuterol (DUONEB) 0.5-2.5 (3) MG/3ML SOLN USE 1 AMPULE IN NEBULIZER EVERY 6 HOURS AS NEEDED   JANUVIA 100 MG tablet Take 1 tablet by mouth daily.   LANTUS SOLOSTAR 100 UNIT/ML Solostar Pen Inject into the skin as needed.   levofloxacin (LEVAQUIN) 750 MG tablet Take 750 mg by mouth daily.   losartan (COZAAR) 100 MG tablet Take 0.5 tablets (50 mg total) by mouth daily.   metFORMIN (GLUCOPHAGE) 500 MG tablet Take 1 tablet (500 mg total) by mouth 2 (two) times daily. (Patient taking differently: Take 500 mg by mouth 2 (two) times daily with a meal. Pt taking 500 mg in AM and 500mg  at night- endocrinology)   montelukast (SINGULAIR) 10 MG tablet TAKE 1 TABLET BY MOUTH AT BEDTIME   nystatin (MYCOSTATIN) 100000 UNIT/ML suspension Take 5 mLs (500,000 Units total) by mouth 4 (four) times daily.   OLANZapine (ZYPREXA) 5 MG tablet Take 5 mg by mouth at bedtime.   ondansetron (ZOFRAN) 8 MG  tablet Take 8 mg by mouth every 8 (eight) hours as needed.   prochlorperazine (COMPAZINE) 10 MG tablet Take 10 mg by mouth every 6 (six) hours as needed.   RELION PEN NEEDLES 32G X 4 MM MISC    sertraline (ZOLOFT) 50 MG tablet Take 1 tablet (50 mg total) by mouth daily.   triamcinolone (NASACORT) 55 MCG/ACT AERO nasal inhaler Place 2 sprays into the nose daily.       02/08/2023    3:21 PM 09/15/2022    9:42 AM 08/24/2022    3:57 PM 06/24/2022    8:57 AM  GAD 7 : Generalized Anxiety Score  Nervous, Anxious, on Edge 0 2 3 0  Control/stop worrying 2 0 3 0  Worry too much - different things 2 0 3 0  Trouble relaxing 0 0 1 0  Restless 0 0 1 0  Easily annoyed or irritable 0 0 2 0  Afraid - awful might happen 0 0 1 0  Total GAD 7 Score 4 2 14  0  Anxiety Difficulty Not difficult at all Not difficult at all Very difficult Not difficult at all       02/08/2023    3:19 PM 09/15/2022    9:42 AM 08/24/2022    3:57 PM  Depression screen PHQ 2/9  Decreased Interest 0 0 1  Down, Depressed, Hopeless 0 0 1  PHQ - 2 Score 0 0 2  Altered sleeping 0 0 0  Tired, decreased energy 1 0 3  Change in appetite 1 0 0  Feeling bad or failure about yourself  0 0 0  Trouble concentrating 0 0 0  Moving slowly or fidgety/restless 0 0 0  Suicidal thoughts 0 0 0  PHQ-9 Score 2 0 5  Difficult doing work/chores Somewhat difficult Not difficult at all Not difficult at all    BP Readings from Last 3 Encounters:  03/04/23 118/60  02/19/23 (!) 165/76  02/08/23 114/72    Physical Exam HENT:     Right Ear: Tympanic membrane normal.     Left Ear: Tympanic membrane normal.     Nose: Nose normal.  Eyes:     Pupils: Pupils are equal, round, and reactive to light.  Pulmonary:     Breath sounds: No wheezing, rhonchi or rales.  Abdominal:     Tenderness: There is no abdominal tenderness. There is no guarding.     Hernia: No hernia is present.  Skin:    Capillary Refill: Capillary refill takes less than 2  seconds.  Neurological:     Mental Status: She is alert.     Wt Readings from Last 3 Encounters:  03/04/23 113 lb (51.3 kg)  02/19/23 111 lb (50.3 kg)  02/08/23 111 lb (50.3 kg)    BP 118/60   Pulse 74   Ht 5\' 2"  (1.575 m)   Wt 113 lb (51.3 kg)   SpO2 96%   BMI 20.67 kg/m   Assessment and Plan:  1. Stress incontinence of urine Relatively recent.  Over the course of several months has been gradually developing to the point of daily incontinence that needs assistance with wearable supply of of urinary barrier protection.  We will proceed with signing for supplies and to assist in any other ways that may be necessary.   Elizabeth Sauer, MD

## 2023-03-04 NOTE — Telephone Encounter (Signed)
Copied from CRM [id number]. Topic: Appointment Scheduling - Scheduling Inquiry for Clinic >> Mar 04, 2023 10:46 AM [place] B wrote: Reason for CRM: pt's daughter called in wanting to see if same day appt today, can be changed to video, says its hard to get pt pt in and out of car. Please call back

## 2023-03-09 DIAGNOSIS — C3490 Malignant neoplasm of unspecified part of unspecified bronchus or lung: Secondary | ICD-10-CM | POA: Diagnosis not present

## 2023-03-09 DIAGNOSIS — C7931 Secondary malignant neoplasm of brain: Secondary | ICD-10-CM | POA: Diagnosis not present

## 2023-03-10 ENCOUNTER — Ambulatory Visit (INDEPENDENT_AMBULATORY_CARE_PROVIDER_SITE_OTHER): Payer: Medicare Other

## 2023-03-10 VITALS — Ht 62.0 in | Wt 113.0 lb

## 2023-03-10 DIAGNOSIS — D696 Thrombocytopenia, unspecified: Secondary | ICD-10-CM | POA: Diagnosis not present

## 2023-03-10 DIAGNOSIS — A4152 Sepsis due to Pseudomonas: Secondary | ICD-10-CM | POA: Diagnosis not present

## 2023-03-10 DIAGNOSIS — Z87891 Personal history of nicotine dependence: Secondary | ICD-10-CM | POA: Diagnosis not present

## 2023-03-10 DIAGNOSIS — Z604 Social exclusion and rejection: Secondary | ICD-10-CM | POA: Diagnosis not present

## 2023-03-10 DIAGNOSIS — C7951 Secondary malignant neoplasm of bone: Secondary | ICD-10-CM | POA: Diagnosis not present

## 2023-03-10 DIAGNOSIS — Z7984 Long term (current) use of oral hypoglycemic drugs: Secondary | ICD-10-CM | POA: Diagnosis not present

## 2023-03-10 DIAGNOSIS — Z0000 Encounter for general adult medical examination without abnormal findings: Secondary | ICD-10-CM

## 2023-03-10 DIAGNOSIS — I2694 Multiple subsegmental pulmonary emboli without acute cor pulmonale: Secondary | ICD-10-CM | POA: Diagnosis not present

## 2023-03-10 DIAGNOSIS — C7931 Secondary malignant neoplasm of brain: Secondary | ICD-10-CM | POA: Diagnosis not present

## 2023-03-10 DIAGNOSIS — R21 Rash and other nonspecific skin eruption: Secondary | ICD-10-CM | POA: Diagnosis not present

## 2023-03-10 DIAGNOSIS — T451X5A Adverse effect of antineoplastic and immunosuppressive drugs, initial encounter: Secondary | ICD-10-CM | POA: Diagnosis not present

## 2023-03-10 DIAGNOSIS — I10 Essential (primary) hypertension: Secondary | ICD-10-CM | POA: Diagnosis not present

## 2023-03-10 DIAGNOSIS — E785 Hyperlipidemia, unspecified: Secondary | ICD-10-CM | POA: Diagnosis not present

## 2023-03-10 DIAGNOSIS — C3490 Malignant neoplasm of unspecified part of unspecified bronchus or lung: Secondary | ICD-10-CM | POA: Diagnosis not present

## 2023-03-10 DIAGNOSIS — F32A Depression, unspecified: Secondary | ICD-10-CM | POA: Diagnosis not present

## 2023-03-10 DIAGNOSIS — J9601 Acute respiratory failure with hypoxia: Secondary | ICD-10-CM | POA: Diagnosis not present

## 2023-03-10 DIAGNOSIS — R112 Nausea with vomiting, unspecified: Secondary | ICD-10-CM | POA: Diagnosis not present

## 2023-03-10 DIAGNOSIS — Z7952 Long term (current) use of systemic steroids: Secondary | ICD-10-CM | POA: Diagnosis not present

## 2023-03-10 DIAGNOSIS — Z556 Problems related to health literacy: Secondary | ICD-10-CM | POA: Diagnosis not present

## 2023-03-10 DIAGNOSIS — T80211A Bloodstream infection due to central venous catheter, initial encounter: Secondary | ICD-10-CM | POA: Diagnosis not present

## 2023-03-10 DIAGNOSIS — D649 Anemia, unspecified: Secondary | ICD-10-CM | POA: Diagnosis not present

## 2023-03-10 DIAGNOSIS — R918 Other nonspecific abnormal finding of lung field: Secondary | ICD-10-CM | POA: Diagnosis not present

## 2023-03-10 DIAGNOSIS — E119 Type 2 diabetes mellitus without complications: Secondary | ICD-10-CM | POA: Diagnosis not present

## 2023-03-10 DIAGNOSIS — J449 Chronic obstructive pulmonary disease, unspecified: Secondary | ICD-10-CM | POA: Diagnosis not present

## 2023-03-10 DIAGNOSIS — Z794 Long term (current) use of insulin: Secondary | ICD-10-CM | POA: Diagnosis not present

## 2023-03-10 NOTE — Patient Instructions (Signed)
Jill Guzman , Thank you for taking time to come for your Medicare Wellness Visit. I appreciate your ongoing commitment to your health goals. Please review the following plan we discussed and let me know if I can assist you in the future.   These are the goals we discussed:  Goals      Exercise 150 min/wk Moderate Activity     Recommend to exercise at least 150 minutes per week     Patient Stated     Pt states she would like to stay healthy and active.     Protect My Health     Timeframe:  Long-Range Goal Priority:  High Start Date:                             Expected End Date:                       Follow Up Date: upcoming appt this month   - schedule and keep appointment for annual check-up/ chemo starting    Why is this important?   Screening tests can find diseases early when they are easier to treat.  Your doctor or nurse will talk with you about which tests are important for you.  Getting shots for common diseases like the flu and shingles will help prevent them.     Notes:         This is a list of the screening recommended for you and due dates:  Health Maintenance  Topic Date Due   Eye exam for diabetics  02/10/2023   COVID-19 Vaccine (4 - 2023-24 season) 03/20/2023*   Mammogram  03/03/2024*   Flu Shot  04/08/2023   Yearly kidney function blood test for diabetes  06/25/2023   Yearly kidney health urinalysis for diabetes  06/25/2023   Hemoglobin A1C  07/27/2023   Complete foot exam   01/01/2024   DEXA scan (bone density measurement)  02/04/2024   Medicare Annual Wellness Visit  03/09/2024   DTaP/Tdap/Td vaccine (4 - Td or Tdap) 09/20/2028   Pneumonia Vaccine  Completed   Zoster (Shingles) Vaccine  Completed   HPV Vaccine  Aged Out  *Topic was postponed. The date shown is not the original due date.   Health Maintenance After Age 19 After age 79, you are at a higher risk for certain long-term diseases and infections as well as injuries from falls. Falls  are a major cause of broken bones and head injuries in people who are older than age 41. Getting regular preventive care can help to keep you healthy and well. Preventive care includes getting regular testing and making lifestyle changes as recommended by your health care provider. Talk with your health care provider about: Which screenings and tests you should have. A screening is a test that checks for a disease when you have no symptoms. A diet and exercise plan that is right for you. What should I know about screenings and tests to prevent falls? Screening and testing are the best ways to find a health problem early. Early diagnosis and treatment give you the best chance of managing medical conditions that are common after age 82. Certain conditions and lifestyle choices may make you more likely to have a fall. Your health care provider may recommend: Regular vision checks. Poor vision and conditions such as cataracts can make you more likely to have a fall. If you wear glasses, make sure  to get your prescription updated if your vision changes. Medicine review. Work with your health care provider to regularly review all of the medicines you are taking, including over-the-counter medicines. Ask your health care provider about any side effects that may make you more likely to have a fall. Tell your health care provider if any medicines that you take make you feel dizzy or sleepy. Strength and balance checks. Your health care provider may recommend certain tests to check your strength and balance while standing, walking, or changing positions. Foot health exam. Foot pain and numbness, as well as not wearing proper footwear, can make you more likely to have a fall. Screenings, including: Osteoporosis screening. Osteoporosis is a condition that causes the bones to get weaker and break more easily. Blood pressure screening. Blood pressure changes and medicines to control blood pressure can make you feel  dizzy. Depression screening. You may be more likely to have a fall if you have a fear of falling, feel depressed, or feel unable to do activities that you used to do. Alcohol use screening. Using too much alcohol can affect your balance and may make you more likely to have a fall. Follow these instructions at home: Lifestyle Do not drink alcohol if: Your health care provider tells you not to drink. If you drink alcohol: Limit how much you have to: 0-1 drink a day for women. 0-2 drinks a day for men. Know how much alcohol is in your drink. In the [place] one drink equals one 12 oz bottle of beer (355 mL), one 5 oz glass of wine (148 mL), or one 1 oz glass of hard liquor (44 mL). Do not use any products that contain nicotine or tobacco. These products include cigarettes, chewing tobacco, and vaping devices, such as e-cigarettes. If you need help quitting, ask your health care provider. Activity  Follow a regular exercise program to stay fit. This will help you maintain your balance. Ask your health care provider what types of exercise are appropriate for you. If you need a cane or walker, use it as recommended by your health care provider. Wear supportive shoes that have nonskid soles. Safety  Remove any tripping hazards, such as rugs, cords, and clutter. Install safety equipment such as grab bars in bathrooms and safety rails on stairs. Keep rooms and walkways well-lit. General instructions Talk with your health care provider about your risks for falling. Tell your health care provider if: You fall. Be sure to tell your health care provider about all falls, even ones that seem minor. You feel dizzy, tiredness (fatigue), or off-balance. Take over-the-counter and prescription medicines only as told by your health care provider. These include supplements. Eat a healthy diet and maintain a healthy weight. A healthy diet includes low-fat dairy products, low-fat (lean) meats, and fiber from whole  grains, beans, and lots of fruits and vegetables. Stay current with your vaccines. Schedule regular health, dental, and eye exams. Summary Having a healthy lifestyle and getting preventive care can help to protect your health and wellness after age 63. Screening and testing are the best way to find a health problem early and help you avoid having a fall. Early diagnosis and treatment give you the best chance for managing medical conditions that are more common for people who are older than age 25. Falls are a major cause of broken bones and head injuries in people who are older than age 2. Take precautions to prevent a fall at home. Work with your health  care provider to learn what changes you can make to improve your health and wellness and to prevent falls. This information is not intended to replace advice given to you by your health care provider. Make sure you discuss any questions you have with your health care provider. Document Revised: 01/13/2021 Document Reviewed: 01/13/2021 Elsevier Patient Education  2024 [employer].
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
## 2023-03-14 DIAGNOSIS — E119 Type 2 diabetes mellitus without complications: Secondary | ICD-10-CM | POA: Diagnosis not present

## 2023-03-15 ENCOUNTER — Other Ambulatory Visit: Payer: Self-pay | Admitting: Family Medicine

## 2023-03-15 DIAGNOSIS — F329 Major depressive disorder, single episode, unspecified: Secondary | ICD-10-CM

## 2023-03-15 DIAGNOSIS — J301 Allergic rhinitis due to pollen: Secondary | ICD-10-CM

## 2023-03-15 NOTE — Telephone Encounter (Signed)
Requested Prescriptions  Pending Prescriptions Disp Refills   sertraline (ZOLOFT) 50 MG tablet [Pharmacy Med Name: Sertraline HCl 50 MG Oral Tablet] 90 tablet 0    Sig: Take 1 tablet by mouth once daily     Psychiatry:  Antidepressants - SSRI - sertraline Passed - 03/15/2023 10:04 AM      Passed - AST in normal range and within 360 days    AST  Date Value Ref Range Status  06/24/2022 13 0 - 40 IU/L Final         Passed - ALT in normal range and within 360 days    ALT  Date Value Ref Range Status  06/24/2022 11 0 - 32 IU/L Final         Passed - Completed PHQ-2 or PHQ-9 in the last 360 days      Passed - Valid encounter within last 6 months    Recent Outpatient Visits           1 week ago Stress incontinence of urine   [place] [facility]   1 month ago Essential hypertension   [place] [facility]   6 months ago Reactive depression   [facility]   6 months ago Chronic obstructive pulmonary disease, unspecified COPD type [facility])   [place] [facility]   6 months ago Acute cough   [place] [facility]       Future Appointments             In 5 months Duanne Limerick, MD [facility], [facility]             montelukast (SINGULAIR) 10 MG tablet [Pharmacy Med Name: Montelukast Sodium 10 MG Oral Tablet] 90 tablet 0    Sig: TAKE 1 TABLET BY MOUTH AT BEDTIME     Pulmonology:  Leukotriene Inhibitors Passed - 03/15/2023 10:04 AM      Passed - Valid encounter within last 12 months    Recent Outpatient Visits           1 week ago Stress incontinence of urine   [place] [facility]   1 month ago Essential hypertension   [place] [facility]   6 months ago Reactive depression   [place] [facility]   6 months ago Chronic obstructive pulmonary disease, unspecified COPD type [facility])   [place] [facility]   6 months ago Acute cough   [place] [facility]       Future Appointments             In 5 months Duanne Limerick, MD [facility] at [facility], [facility]

## 2023-03-16 ENCOUNTER — Ambulatory Visit: Payer: Medicare Other | Admitting: Family Medicine

## 2023-03-16 ENCOUNTER — Other Ambulatory Visit: Payer: Self-pay | Admitting: Family Medicine

## 2023-03-16 DIAGNOSIS — J301 Allergic rhinitis due to pollen: Secondary | ICD-10-CM

## 2023-03-16 DIAGNOSIS — I10 Essential (primary) hypertension: Secondary | ICD-10-CM

## 2023-03-17 DIAGNOSIS — Z452 Encounter for adjustment and management of vascular access device: Secondary | ICD-10-CM | POA: Diagnosis not present

## 2023-03-22 DIAGNOSIS — R112 Nausea with vomiting, unspecified: Secondary | ICD-10-CM | POA: Diagnosis not present

## 2023-03-22 DIAGNOSIS — D696 Thrombocytopenia, unspecified: Secondary | ICD-10-CM | POA: Diagnosis not present

## 2023-03-22 DIAGNOSIS — D630 Anemia in neoplastic disease: Secondary | ICD-10-CM | POA: Diagnosis not present

## 2023-03-22 DIAGNOSIS — C3490 Malignant neoplasm of unspecified part of unspecified bronchus or lung: Secondary | ICD-10-CM | POA: Diagnosis not present

## 2023-03-22 DIAGNOSIS — Z79899 Other long term (current) drug therapy: Secondary | ICD-10-CM | POA: Diagnosis not present

## 2023-03-22 DIAGNOSIS — T451X5A Adverse effect of antineoplastic and immunosuppressive drugs, initial encounter: Secondary | ICD-10-CM | POA: Diagnosis not present

## 2023-03-22 DIAGNOSIS — E119 Type 2 diabetes mellitus without complications: Secondary | ICD-10-CM | POA: Diagnosis not present

## 2023-03-22 DIAGNOSIS — C7931 Secondary malignant neoplasm of brain: Secondary | ICD-10-CM | POA: Diagnosis not present

## 2023-03-31 DIAGNOSIS — J701 Chronic and other pulmonary manifestations due to radiation: Secondary | ICD-10-CM | POA: Diagnosis not present

## 2023-03-31 DIAGNOSIS — J984 Other disorders of lung: Secondary | ICD-10-CM | POA: Diagnosis not present

## 2023-03-31 DIAGNOSIS — C7931 Secondary malignant neoplasm of brain: Secondary | ICD-10-CM | POA: Diagnosis not present

## 2023-03-31 DIAGNOSIS — Z95828 Presence of other vascular implants and grafts: Secondary | ICD-10-CM | POA: Diagnosis not present

## 2023-03-31 DIAGNOSIS — R911 Solitary pulmonary nodule: Secondary | ICD-10-CM | POA: Diagnosis not present

## 2023-03-31 DIAGNOSIS — C3490 Malignant neoplasm of unspecified part of unspecified bronchus or lung: Secondary | ICD-10-CM | POA: Diagnosis not present

## 2023-03-31 DIAGNOSIS — R918 Other nonspecific abnormal finding of lung field: Secondary | ICD-10-CM | POA: Diagnosis not present

## 2023-04-02 DIAGNOSIS — E119 Type 2 diabetes mellitus without complications: Secondary | ICD-10-CM | POA: Diagnosis not present

## 2023-04-02 DIAGNOSIS — C3490 Malignant neoplasm of unspecified part of unspecified bronchus or lung: Secondary | ICD-10-CM | POA: Diagnosis not present

## 2023-04-02 DIAGNOSIS — I2694 Multiple subsegmental pulmonary emboli without acute cor pulmonale: Secondary | ICD-10-CM | POA: Diagnosis not present

## 2023-04-02 DIAGNOSIS — I10 Essential (primary) hypertension: Secondary | ICD-10-CM | POA: Diagnosis not present

## 2023-04-02 DIAGNOSIS — J9601 Acute respiratory failure with hypoxia: Secondary | ICD-10-CM | POA: Diagnosis not present

## 2023-04-02 DIAGNOSIS — J449 Chronic obstructive pulmonary disease, unspecified: Secondary | ICD-10-CM | POA: Diagnosis not present

## 2023-04-02 DIAGNOSIS — T80211A Bloodstream infection due to central venous catheter, initial encounter: Secondary | ICD-10-CM | POA: Diagnosis not present

## 2023-04-02 DIAGNOSIS — D696 Thrombocytopenia, unspecified: Secondary | ICD-10-CM | POA: Diagnosis not present

## 2023-04-02 DIAGNOSIS — C7951 Secondary malignant neoplasm of bone: Secondary | ICD-10-CM | POA: Diagnosis not present

## 2023-04-02 DIAGNOSIS — A4152 Sepsis due to Pseudomonas: Secondary | ICD-10-CM | POA: Diagnosis not present

## 2023-04-02 DIAGNOSIS — C7931 Secondary malignant neoplasm of brain: Secondary | ICD-10-CM | POA: Diagnosis not present

## 2023-04-02 DIAGNOSIS — R918 Other nonspecific abnormal finding of lung field: Secondary | ICD-10-CM | POA: Diagnosis not present

## 2023-04-02 DIAGNOSIS — J441 Chronic obstructive pulmonary disease with (acute) exacerbation: Secondary | ICD-10-CM | POA: Diagnosis not present

## 2023-04-05 DIAGNOSIS — Z801 Family history of malignant neoplasm of trachea, bronchus and lung: Secondary | ICD-10-CM | POA: Diagnosis not present

## 2023-04-05 DIAGNOSIS — I2694 Multiple subsegmental pulmonary emboli without acute cor pulmonale: Secondary | ICD-10-CM | POA: Diagnosis not present

## 2023-04-05 DIAGNOSIS — C3490 Malignant neoplasm of unspecified part of unspecified bronchus or lung: Secondary | ICD-10-CM | POA: Diagnosis not present

## 2023-04-05 DIAGNOSIS — R0902 Hypoxemia: Secondary | ICD-10-CM | POA: Diagnosis not present

## 2023-04-05 DIAGNOSIS — R0609 Other forms of dyspnea: Secondary | ICD-10-CM | POA: Diagnosis not present

## 2023-04-05 DIAGNOSIS — J984 Other disorders of lung: Secondary | ICD-10-CM | POA: Diagnosis not present

## 2023-04-05 DIAGNOSIS — Z86711 Personal history of pulmonary embolism: Secondary | ICD-10-CM | POA: Diagnosis not present

## 2023-04-05 DIAGNOSIS — J432 Centrilobular emphysema: Secondary | ICD-10-CM | POA: Diagnosis not present

## 2023-04-05 DIAGNOSIS — C7931 Secondary malignant neoplasm of brain: Secondary | ICD-10-CM | POA: Diagnosis not present

## 2023-04-05 DIAGNOSIS — J4489 Other specified chronic obstructive pulmonary disease: Secondary | ICD-10-CM | POA: Diagnosis not present

## 2023-04-05 DIAGNOSIS — Z87891 Personal history of nicotine dependence: Secondary | ICD-10-CM | POA: Diagnosis not present

## 2023-04-05 DIAGNOSIS — E119 Type 2 diabetes mellitus without complications: Secondary | ICD-10-CM | POA: Diagnosis not present

## 2023-04-12 DIAGNOSIS — I10 Essential (primary) hypertension: Secondary | ICD-10-CM | POA: Diagnosis not present

## 2023-04-12 DIAGNOSIS — R918 Other nonspecific abnormal finding of lung field: Secondary | ICD-10-CM | POA: Diagnosis not present

## 2023-04-12 DIAGNOSIS — R112 Nausea with vomiting, unspecified: Secondary | ICD-10-CM | POA: Diagnosis not present

## 2023-04-12 DIAGNOSIS — Z91041 Radiographic dye allergy status: Secondary | ICD-10-CM | POA: Diagnosis not present

## 2023-04-12 DIAGNOSIS — M810 Age-related osteoporosis without current pathological fracture: Secondary | ICD-10-CM | POA: Diagnosis not present

## 2023-04-12 DIAGNOSIS — Z5112 Encounter for antineoplastic immunotherapy: Secondary | ICD-10-CM | POA: Diagnosis not present

## 2023-04-12 DIAGNOSIS — R9389 Abnormal findings on diagnostic imaging of other specified body structures: Secondary | ICD-10-CM | POA: Diagnosis not present

## 2023-04-12 DIAGNOSIS — E7849 Other hyperlipidemia: Secondary | ICD-10-CM | POA: Diagnosis not present

## 2023-04-12 DIAGNOSIS — Z7951 Long term (current) use of inhaled steroids: Secondary | ICD-10-CM | POA: Diagnosis not present

## 2023-04-12 DIAGNOSIS — D649 Anemia, unspecified: Secondary | ICD-10-CM | POA: Diagnosis not present

## 2023-04-12 DIAGNOSIS — G9389 Other specified disorders of brain: Secondary | ICD-10-CM | POA: Diagnosis not present

## 2023-04-12 DIAGNOSIS — T451X5A Adverse effect of antineoplastic and immunosuppressive drugs, initial encounter: Secondary | ICD-10-CM | POA: Diagnosis not present

## 2023-04-12 DIAGNOSIS — I714 Abdominal aortic aneurysm, without rupture, unspecified: Secondary | ICD-10-CM | POA: Diagnosis not present

## 2023-04-12 DIAGNOSIS — E119 Type 2 diabetes mellitus without complications: Secondary | ICD-10-CM | POA: Diagnosis not present

## 2023-04-12 DIAGNOSIS — Z923 Personal history of irradiation: Secondary | ICD-10-CM | POA: Diagnosis not present

## 2023-04-12 DIAGNOSIS — Z87891 Personal history of nicotine dependence: Secondary | ICD-10-CM | POA: Diagnosis not present

## 2023-04-12 DIAGNOSIS — Z794 Long term (current) use of insulin: Secondary | ICD-10-CM | POA: Diagnosis not present

## 2023-04-12 DIAGNOSIS — Z1159 Encounter for screening for other viral diseases: Secondary | ICD-10-CM | POA: Diagnosis not present

## 2023-04-12 DIAGNOSIS — C3490 Malignant neoplasm of unspecified part of unspecified bronchus or lung: Secondary | ICD-10-CM | POA: Diagnosis not present

## 2023-04-12 DIAGNOSIS — Z5111 Encounter for antineoplastic chemotherapy: Secondary | ICD-10-CM | POA: Diagnosis not present

## 2023-04-12 DIAGNOSIS — C7931 Secondary malignant neoplasm of brain: Secondary | ICD-10-CM | POA: Diagnosis not present

## 2023-04-12 DIAGNOSIS — Z79899 Other long term (current) drug therapy: Secondary | ICD-10-CM | POA: Diagnosis not present

## 2023-04-12 DIAGNOSIS — D696 Thrombocytopenia, unspecified: Secondary | ICD-10-CM | POA: Diagnosis not present

## 2023-04-14 DIAGNOSIS — E119 Type 2 diabetes mellitus without complications: Secondary | ICD-10-CM | POA: Diagnosis not present

## 2023-04-16 DIAGNOSIS — J323 Chronic sphenoidal sinusitis: Secondary | ICD-10-CM | POA: Diagnosis not present

## 2023-04-16 DIAGNOSIS — C7931 Secondary malignant neoplasm of brain: Secondary | ICD-10-CM | POA: Diagnosis not present

## 2023-04-16 DIAGNOSIS — Z7901 Long term (current) use of anticoagulants: Secondary | ICD-10-CM | POA: Diagnosis not present

## 2023-04-16 DIAGNOSIS — I2699 Other pulmonary embolism without acute cor pulmonale: Secondary | ICD-10-CM | POA: Diagnosis not present

## 2023-04-16 DIAGNOSIS — C3412 Malignant neoplasm of upper lobe, left bronchus or lung: Secondary | ICD-10-CM | POA: Diagnosis not present

## 2023-04-16 DIAGNOSIS — G9389 Other specified disorders of brain: Secondary | ICD-10-CM | POA: Diagnosis not present

## 2023-05-03 DIAGNOSIS — J441 Chronic obstructive pulmonary disease with (acute) exacerbation: Secondary | ICD-10-CM | POA: Diagnosis not present

## 2023-05-03 DIAGNOSIS — J9601 Acute respiratory failure with hypoxia: Secondary | ICD-10-CM | POA: Diagnosis not present

## 2023-05-04 DIAGNOSIS — Z882 Allergy status to sulfonamides status: Secondary | ICD-10-CM | POA: Diagnosis not present

## 2023-05-04 DIAGNOSIS — Z1159 Encounter for screening for other viral diseases: Secondary | ICD-10-CM | POA: Diagnosis not present

## 2023-05-04 DIAGNOSIS — R0602 Shortness of breath: Secondary | ICD-10-CM | POA: Diagnosis not present

## 2023-05-04 DIAGNOSIS — R9389 Abnormal findings on diagnostic imaging of other specified body structures: Secondary | ICD-10-CM | POA: Diagnosis not present

## 2023-05-04 DIAGNOSIS — Z79899 Other long term (current) drug therapy: Secondary | ICD-10-CM | POA: Diagnosis not present

## 2023-05-04 DIAGNOSIS — Z7901 Long term (current) use of anticoagulants: Secondary | ICD-10-CM | POA: Diagnosis not present

## 2023-05-04 DIAGNOSIS — Z880 Allergy status to penicillin: Secondary | ICD-10-CM | POA: Diagnosis not present

## 2023-05-04 DIAGNOSIS — Z91041 Radiographic dye allergy status: Secondary | ICD-10-CM | POA: Diagnosis not present

## 2023-05-04 DIAGNOSIS — Z5112 Encounter for antineoplastic immunotherapy: Secondary | ICD-10-CM | POA: Diagnosis not present

## 2023-05-04 DIAGNOSIS — R220 Localized swelling, mass and lump, head: Secondary | ICD-10-CM | POA: Diagnosis not present

## 2023-05-04 DIAGNOSIS — Z87891 Personal history of nicotine dependence: Secondary | ICD-10-CM | POA: Diagnosis not present

## 2023-05-04 DIAGNOSIS — C7931 Secondary malignant neoplasm of brain: Secondary | ICD-10-CM | POA: Diagnosis not present

## 2023-05-04 DIAGNOSIS — Z7984 Long term (current) use of oral hypoglycemic drugs: Secondary | ICD-10-CM | POA: Diagnosis not present

## 2023-05-04 DIAGNOSIS — E7849 Other hyperlipidemia: Secondary | ICD-10-CM | POA: Diagnosis not present

## 2023-05-04 DIAGNOSIS — I10 Essential (primary) hypertension: Secondary | ICD-10-CM | POA: Diagnosis not present

## 2023-05-04 DIAGNOSIS — Z794 Long term (current) use of insulin: Secondary | ICD-10-CM | POA: Diagnosis not present

## 2023-05-04 DIAGNOSIS — E119 Type 2 diabetes mellitus without complications: Secondary | ICD-10-CM | POA: Diagnosis not present

## 2023-05-04 DIAGNOSIS — C3490 Malignant neoplasm of unspecified part of unspecified bronchus or lung: Secondary | ICD-10-CM | POA: Diagnosis not present

## 2023-05-04 DIAGNOSIS — I714 Abdominal aortic aneurysm, without rupture, unspecified: Secondary | ICD-10-CM | POA: Diagnosis not present

## 2023-05-07 DIAGNOSIS — E785 Hyperlipidemia, unspecified: Secondary | ICD-10-CM | POA: Diagnosis not present

## 2023-05-07 DIAGNOSIS — M810 Age-related osteoporosis without current pathological fracture: Secondary | ICD-10-CM | POA: Diagnosis not present

## 2023-05-07 DIAGNOSIS — E1169 Type 2 diabetes mellitus with other specified complication: Secondary | ICD-10-CM | POA: Diagnosis not present

## 2023-05-07 DIAGNOSIS — E119 Type 2 diabetes mellitus without complications: Secondary | ICD-10-CM | POA: Diagnosis not present

## 2023-05-07 LAB — HEMOGLOBIN A1C: Hemoglobin A1C: 7.2

## 2023-05-25 DIAGNOSIS — C7931 Secondary malignant neoplasm of brain: Secondary | ICD-10-CM | POA: Diagnosis not present

## 2023-05-25 DIAGNOSIS — C3490 Malignant neoplasm of unspecified part of unspecified bronchus or lung: Secondary | ICD-10-CM | POA: Diagnosis not present

## 2023-05-25 DIAGNOSIS — Z5112 Encounter for antineoplastic immunotherapy: Secondary | ICD-10-CM | POA: Diagnosis not present

## 2023-05-25 DIAGNOSIS — Z1159 Encounter for screening for other viral diseases: Secondary | ICD-10-CM | POA: Diagnosis not present

## 2023-05-25 DIAGNOSIS — R9389 Abnormal findings on diagnostic imaging of other specified body structures: Secondary | ICD-10-CM | POA: Diagnosis not present

## 2023-05-28 DIAGNOSIS — M26653 Arthropathy of bilateral temporomandibular joint: Secondary | ICD-10-CM | POA: Diagnosis not present

## 2023-05-28 DIAGNOSIS — C3490 Malignant neoplasm of unspecified part of unspecified bronchus or lung: Secondary | ICD-10-CM | POA: Diagnosis not present

## 2023-05-28 DIAGNOSIS — R9389 Abnormal findings on diagnostic imaging of other specified body structures: Secondary | ICD-10-CM | POA: Diagnosis not present

## 2023-05-28 DIAGNOSIS — C7931 Secondary malignant neoplasm of brain: Secondary | ICD-10-CM | POA: Diagnosis not present

## 2023-06-01 ENCOUNTER — Other Ambulatory Visit (INDEPENDENT_AMBULATORY_CARE_PROVIDER_SITE_OTHER): Payer: Self-pay | Admitting: Nurse Practitioner

## 2023-06-01 DIAGNOSIS — I714 Abdominal aortic aneurysm, without rupture, unspecified: Secondary | ICD-10-CM

## 2023-06-02 DIAGNOSIS — C3490 Malignant neoplasm of unspecified part of unspecified bronchus or lung: Secondary | ICD-10-CM | POA: Diagnosis not present

## 2023-06-02 DIAGNOSIS — I6522 Occlusion and stenosis of left carotid artery: Secondary | ICD-10-CM | POA: Diagnosis not present

## 2023-06-02 DIAGNOSIS — C7931 Secondary malignant neoplasm of brain: Secondary | ICD-10-CM | POA: Diagnosis not present

## 2023-06-02 DIAGNOSIS — Q278 Other specified congenital malformations of peripheral vascular system: Secondary | ICD-10-CM | POA: Diagnosis not present

## 2023-06-03 DIAGNOSIS — J441 Chronic obstructive pulmonary disease with (acute) exacerbation: Secondary | ICD-10-CM | POA: Diagnosis not present

## 2023-06-03 DIAGNOSIS — J9601 Acute respiratory failure with hypoxia: Secondary | ICD-10-CM | POA: Diagnosis not present

## 2023-06-04 ENCOUNTER — Ambulatory Visit (INDEPENDENT_AMBULATORY_CARE_PROVIDER_SITE_OTHER): Payer: Medicare Other | Admitting: Vascular Surgery

## 2023-06-04 ENCOUNTER — Ambulatory Visit (INDEPENDENT_AMBULATORY_CARE_PROVIDER_SITE_OTHER): Payer: Medicare Other

## 2023-06-04 VITALS — BP 154/77 | HR 67 | Resp 19

## 2023-06-04 DIAGNOSIS — I10 Essential (primary) hypertension: Secondary | ICD-10-CM

## 2023-06-04 DIAGNOSIS — C7931 Secondary malignant neoplasm of brain: Secondary | ICD-10-CM | POA: Diagnosis not present

## 2023-06-04 DIAGNOSIS — I6522 Occlusion and stenosis of left carotid artery: Secondary | ICD-10-CM

## 2023-06-04 DIAGNOSIS — I7143 Infrarenal abdominal aortic aneurysm, without rupture: Secondary | ICD-10-CM | POA: Diagnosis not present

## 2023-06-04 DIAGNOSIS — C3490 Malignant neoplasm of unspecified part of unspecified bronchus or lung: Secondary | ICD-10-CM

## 2023-06-04 DIAGNOSIS — E119 Type 2 diabetes mellitus without complications: Secondary | ICD-10-CM | POA: Diagnosis not present

## 2023-06-04 DIAGNOSIS — I6529 Occlusion and stenosis of unspecified carotid artery: Secondary | ICD-10-CM | POA: Insufficient documentation

## 2023-06-04 DIAGNOSIS — I714 Abdominal aortic aneurysm, without rupture, unspecified: Secondary | ICD-10-CM | POA: Diagnosis not present

## 2023-06-04 NOTE — Assessment & Plan Note (Signed)
Follows with oncology

## 2023-06-04 NOTE — H&P (View-Only) (Signed)
MRN : [id number]  Jill Guzman is a 82 y.o. ([birth year]) female who presents with chief complaint of  Chief Complaint  Patient presents with   Venous Insufficiency  .  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  We have followed her for her abdominal aortic aneurysm for many years.  This was relatively stable for some time, but over the past 9 months, this has grown almost a centimeter.  It measured 5.6 cm on duplex today and a CT scan of the abdomen and pelvis from May showed this to be 5.5 cm.  We had measured at 5 cm on her last duplex.  She has lung cancer with brain metastases but has responded very well to treatment.  She continues to get chemotherapy.  She is also following with a pulmonologist for her lung disease. In addition, she has been getting MRI studies for follow-up of her brain metastases.  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  This was followed by an MRA.  Study.  I have seen the results but do not have the images for review.  This suggested a chronically occluded left carotid artery with a widely patent right carotid artery and bilateral vertebral arteries which were patent.  No recent focal neurologic symptoms. Specifically, the patient denies amaurosis fugax, speech or swallowing difficulties, or arm or leg weakness or numbness   Current Outpatient Medications  Medication Sig Dispense Refill   ACCU-CHEK GUIDE test strip USE 1 STRIP TO CHECK GLUCOSE FOR BLOOD SUGAR THREE TIMES DAILY AND FOR SYMPTOMS OF HIGH OR LOW BLOOD SUGAR     acetaminophen (TYLENOL) 500 MG tablet Take by mouth.     albuterol (VENTOLIN HFA) 108 (90 Base) MCG/ACT inhaler INHALE 1 PUFF BY MOUTH 4 TIMES DAILY 18 g 5   apixaban (ELIQUIS) 5 MG TABS tablet Take by mouth.     atorvastatin (LIPITOR) 10 MG tablet Take 1 tablet (10 mg total) by mouth daily. 90 tablet  1   Blood Glucose Monitoring Suppl (ACCU-CHEK GUIDE ME) w/Device KIT Test once daily 1 kit 0   budesonide-formoterol (SYMBICORT) 80-4.5 MCG/ACT inhaler Inhale 2 puffs into the lungs 2 (two) times daily. 1 each 12   cetirizine (ZYRTEC) 10 MG tablet Take 10 mg by mouth as needed for allergies. otc     Cholecalciferol (VITAMIN D) 50 MCG (2000 UT) CAPS Take by mouth.     dexamethasone (DECADRON) 4 MG tablet Take 2 tablets by mouth daily.     fluticasone (FLONASE) 50 MCG/ACT nasal spray Place 2 sprays into both nostrils daily.     folic acid (FOLVITE) 1 MG tablet Take 1 mg by mouth daily.     HUMALOG KWIKPEN 100 UNIT/ML KwikPen Inject into the skin. Sliding scale as needed per endo     ipratropium-albuterol (DUONEB) 0.5-2.5 (3) MG/3ML SOLN USE 1 AMPULE IN NEBULIZER EVERY 6 HOURS AS NEEDED 360 mL 0   LANTUS SOLOSTAR 100 UNIT/ML Solostar Pen Inject into the skin as needed.     lidocaine-prilocaine (EMLA) cream Apply to small amount to port site and cover with occlusive dressing 1 hr prior to access.     losartan (COZAAR) 100 MG tablet Take 0.5 tablets (50 mg total) by mouth daily. 90 tablet 1   meloxicam (MOBIC) 7.5 MG tablet Take by mouth.     metFORMIN (  GLUCOPHAGE) 500 MG tablet Take 1 tablet (500 mg total) by mouth 2 (two) times daily. (Patient taking differently: Take 500 mg by mouth 2 (two) times daily with a meal. Pt taking 500 mg in AM and 500mg  at night- endocrinology) 180 tablet 6   montelukast (SINGULAIR) 10 MG tablet TAKE 1 TABLET BY MOUTH AT BEDTIME 90 tablet 0   OLANZapine (ZYPREXA) 5 MG tablet Take 5 mg by mouth at bedtime.     ondansetron (ZOFRAN) 8 MG tablet Take 8 mg by mouth every 8 (eight) hours as needed.     oxyCODONE (OXY IR/ROXICODONE) 5 MG immediate release tablet Take 5 mg by mouth every 4 (four) hours as needed.     predniSONE (DELTASONE) 50 MG tablet Take by mouth.     prochlorperazine (COMPAZINE) 10 MG tablet Take 10 mg by mouth every 6 (six) hours as needed.     RELION PEN  NEEDLES 32G X 4 MM MISC      sertraline (ZOLOFT) 50 MG tablet Take 1 tablet by mouth once daily 90 tablet 0   triamcinolone (NASACORT) 55 MCG/ACT AERO nasal inhaler Place 2 sprays into the nose daily. 1 each 12   apixaban (ELIQUIS) 5 MG TABS tablet Take 5 mg by mouth 2 (two) times daily.     JANUVIA 100 MG tablet Take 1 tablet by mouth daily. (Patient not taking: Reported on 06/04/2023)     No current facility-administered medications for this visit.    Past Medical History:  Diagnosis Date   Anxiety    Aortic aneurysm (HCC)    Asthma    Bell's palsy    age 26 and age 26    Cancer (HCC)    COPD (chronic obstructive pulmonary disease) (HCC)    Deaf, right    Depression    Diabetes mellitus without complication (HCC)    Hearing aid worn    left   Hyperlipidemia    Hypertension    Shingles 08/13/2020   Varicose veins of legs    Vertigo    random, approx 1x/month   Wears dentures    full upper and lower    Past Surgical History:  Procedure Laterality Date   ABDOMINAL HYSTERECTOMY     BREAST EXCISIONAL BIOPSY Left 1976   neg   CATARACT EXTRACTION W/ INTRAOCULAR LENS  IMPLANT, BILATERAL     COLONOSCOPY  2014   normal   cyst on bladder removed     cyst removed breast Left    KNEE ARTHROSCOPY WITH MEDIAL MENISECTOMY Left 10/20/2019   Procedure: KNEE ARTHROSCOPY WITH PARTIAL MEDIAL MENISECTOMY;  Surgeon: Signa Kell, MD;  Location: [facility];  Service: Orthopedics;  Laterality: Left;  DIABETIC - oral meds   PARTIAL HYSTERECTOMY     TUBAL LIGATION       Social History   Tobacco Use   Smoking status: Former    Current packs/day: 0.00    Average packs/day: 2.0 packs/day for 10.0 years (20.0 ttl pk-yrs)    Types: Cigarettes    Start date: 05/10/1989    Quit date: 05/11/1999    Years since quitting: 24.0   Smokeless tobacco: Never   Tobacco comments:    Smoking cessation materials not required  Vaping Use   Vaping status: Never Used  Substance Use Topics    Alcohol use: No    Alcohol/week: 0.0 standard drinks of alcohol   Drug use: No      Family History  Problem Relation Age of Onset  Diabetes Mother    Stroke Mother    Healthy Daughter    Cancer Son 35       lung   Hypertension Son    Rheum arthritis Daughter    Hypertension Daughter    Arthritis Daughter    COPD Son    Breast cancer Neg Hx      Allergies  Allergen Reactions   Cefepime Rash   Penicillins Itching   Sulfa Antibiotics Itching   Iodinated Contrast Media Itching    Redness and itchiness   Vancomycin Rash     REVIEW OF SYSTEMS (Negative unless checked)   Constitutional: [x] Weight loss  [] Fever  [] Chills Cardiac: [] Chest pain   [] Chest pressure   [] Palpitations   [] Shortness of breath when laying flat   [x] Shortness of breath at rest   [x] Shortness of breath with exertion. Vascular:  [] Pain in legs with walking   [] Pain in legs at rest   [] Pain in legs when laying flat   [] Claudication   [] Pain in feet when walking  [] Pain in feet at rest  [] Pain in feet when laying flat   [] History of DVT   [] Phlebitis   [] Swelling in legs   [] Varicose veins   [] Non-healing ulcers Pulmonary:   [] Uses home oxygen   [] Productive cough   [] Hemoptysis   [] Wheeze  [x] COPD   [] Asthma Neurologic:  [] Dizziness  [] Blackouts   [] Seizures   [] History of stroke   [] History of TIA  [] Aphasia   [] Temporary blindness   [] Dysphagia   [] Weakness or numbness in arms   [] Weakness or numbness in legs Musculoskeletal:  [x] Arthritis   [] Joint swelling   [x] Joint pain   [] Low back pain Hematologic:  [] Easy bruising  [] Easy bleeding   [] Hypercoagulable state   [] Anemic   Gastrointestinal:  [] Blood in stool   [] Vomiting blood  [] Gastroesophageal reflux/heartburn   [] Abdominal pain Genitourinary:  [] Chronic kidney disease   [] Difficult urination  [] Frequent urination  [] Burning with urination   [] Hematuria Skin:  [] Rashes   [] Ulcers   [] Wounds Psychological:  [] History of anxiety   [x]  History of major  depression.  Physical Examination  BP (!) 154/77 (BP Location: Left Arm)   Pulse 67   Resp 19  Gen:  WD/WN, NAD Head: [place]/AT, + temporalis wasting. Ear/Nose/Throat: Hearing grossly intact, nares w/o erythema or drainage Eyes: Conjunctiva clear. Sclera non-icteric Neck: Supple.  Trachea midline Pulmonary:  Good air movement, no use of accessory muscles.  Cardiac: RRR, no JVD Vascular:  Vessel Right Left  Radial Palpable Palpable                          PT Palpable Palpable  DP Palpable Palpable   Gastrointestinal: soft, non-tender/non-distended. Increased aortic impulse. Musculoskeletal: M/S 5/5 throughout.  No deformity or atrophy. No LE edema. Neurologic: Sensation grossly intact in extremities.  Symmetrical.  Speech is fluent.  Psychiatric: Judgment intact, Mood & affect appropriate for pt's clinical situation. Dermatologic: No rashes or ulcers noted.  No cellulitis or open wounds.      Labs No results found for this or any previous visit (from the past 2160 hour(s)).  Radiology No results found.  Assessment/Plan Diabetes blood glucose control important in reducing the progression of atherosclerotic disease. Also, involved in wound healing. On appropriate medications.     BP (high blood pressure) blood pressure control important in reducing the progression of atherosclerotic disease and aneurysmal growth. On appropriate oral medications.  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  She has been getting multiple imaging studies due to her lung cancer with brain metastases which is responding well to treatment.  No focal neurologic symptoms.  No role for intervention with a chronic occlusion.  Discussed continuing her Eliquis.  We will plan a carotid duplex in 6  months and we will follow her with duplex largely for her right side going forward.  AAA (abdominal aortic aneurysm) without rupture (HCC) The patient has had significant growth of her abdominal aortic aneurysm over the past year.  It now measures 5.6 cm on duplex today.  This has grown almost a centimeter in less than a year and now is clearly a high risk aneurysm with its size as well as relatively rapid growth.  She is scheduled to get a CT scan of her chest next week and if we could add an abdomen and pelvis to that, that would be very helpful.  She did have a CT scan back in May that we can also use but this was also done at [facility] and I do not have the images for review today.  We will try to obtain these.  I have discussed with the patient and her family and will be in touch with her oncologist and pulmonologist to try to optimize her prior to surgery.  Risks and benefits of the procedure were discussed in detail and she desires very much to proceed.    Festus Barren, MD  06/04/2023 10:37 AM    This note was created with Dragon medical transcription system.  Any errors from dictation are purely unintentional

## 2023-06-04 NOTE — Progress Notes (Signed)
MRN : [id number]  Jill Guzman is a 82 y.o. ([birth year]) female who presents with chief complaint of  Chief Complaint  Patient presents with   Venous Insufficiency  .  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  We have followed her for her abdominal aortic aneurysm for many years.  This was relatively stable for some time, but over the past 9 months, this has grown almost a centimeter.  It measured 5.6 cm on duplex today and a CT scan of the abdomen and pelvis from May showed this to be 5.5 cm.  We had measured at 5 cm on her last duplex.  She has lung cancer with brain metastases but has responded very well to treatment.  She continues to get chemotherapy.  She is also following with a pulmonologist for her lung disease. In addition, she has been getting MRI studies for follow-up of her brain metastases.  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  This was followed by an MRA.  Study.  I have seen the results but do not have the images for review.  This suggested a chronically occluded left carotid artery with a widely patent right carotid artery and bilateral vertebral arteries which were patent.  No recent focal neurologic symptoms. Specifically, the patient denies amaurosis fugax, speech or swallowing difficulties, or arm or leg weakness or numbness   Current Outpatient Medications  Medication Sig Dispense Refill   ACCU-CHEK GUIDE test strip USE 1 STRIP TO CHECK GLUCOSE FOR BLOOD SUGAR THREE TIMES DAILY AND FOR SYMPTOMS OF HIGH OR LOW BLOOD SUGAR     acetaminophen (TYLENOL) 500 MG tablet Take by mouth.     albuterol (VENTOLIN HFA) 108 (90 Base) MCG/ACT inhaler INHALE 1 PUFF BY MOUTH 4 TIMES DAILY 18 g 5   apixaban (ELIQUIS) 5 MG TABS tablet Take by mouth.     atorvastatin (LIPITOR) 10 MG tablet Take 1 tablet (10 mg total) by mouth daily. 90 tablet  1   Blood Glucose Monitoring Suppl (ACCU-CHEK GUIDE ME) w/Device KIT Test once daily 1 kit 0   budesonide-formoterol (SYMBICORT) 80-4.5 MCG/ACT inhaler Inhale 2 puffs into the lungs 2 (two) times daily. 1 each 12   cetirizine (ZYRTEC) 10 MG tablet Take 10 mg by mouth as needed for allergies. otc     Cholecalciferol (VITAMIN D) 50 MCG (2000 UT) CAPS Take by mouth.     dexamethasone (DECADRON) 4 MG tablet Take 2 tablets by mouth daily.     fluticasone (FLONASE) 50 MCG/ACT nasal spray Place 2 sprays into both nostrils daily.     folic acid (FOLVITE) 1 MG tablet Take 1 mg by mouth daily.     HUMALOG KWIKPEN 100 UNIT/ML KwikPen Inject into the skin. Sliding scale as needed per endo     ipratropium-albuterol (DUONEB) 0.5-2.5 (3) MG/3ML SOLN USE 1 AMPULE IN NEBULIZER EVERY 6 HOURS AS NEEDED 360 mL 0   LANTUS SOLOSTAR 100 UNIT/ML Solostar Pen Inject into the skin as needed.     lidocaine-prilocaine (EMLA) cream Apply to small amount to port site and cover with occlusive dressing 1 hr prior to access.     losartan (COZAAR) 100 MG tablet Take 0.5 tablets (50 mg total) by mouth daily. 90 tablet 1   meloxicam (MOBIC) 7.5 MG tablet Take by mouth.     metFORMIN (  GLUCOPHAGE) 500 MG tablet Take 1 tablet (500 mg total) by mouth 2 (two) times daily. (Patient taking differently: Take 500 mg by mouth 2 (two) times daily with a meal. Pt taking 500 mg in AM and 500mg  at night- endocrinology) 180 tablet 6   montelukast (SINGULAIR) 10 MG tablet TAKE 1 TABLET BY MOUTH AT BEDTIME 90 tablet 0   OLANZapine (ZYPREXA) 5 MG tablet Take 5 mg by mouth at bedtime.     ondansetron (ZOFRAN) 8 MG tablet Take 8 mg by mouth every 8 (eight) hours as needed.     oxyCODONE (OXY IR/ROXICODONE) 5 MG immediate release tablet Take 5 mg by mouth every 4 (four) hours as needed.     predniSONE (DELTASONE) 50 MG tablet Take by mouth.     prochlorperazine (COMPAZINE) 10 MG tablet Take 10 mg by mouth every 6 (six) hours as needed.     RELION PEN  NEEDLES 32G X 4 MM MISC      sertraline (ZOLOFT) 50 MG tablet Take 1 tablet by mouth once daily 90 tablet 0   triamcinolone (NASACORT) 55 MCG/ACT AERO nasal inhaler Place 2 sprays into the nose daily. 1 each 12   apixaban (ELIQUIS) 5 MG TABS tablet Take 5 mg by mouth 2 (two) times daily.     JANUVIA 100 MG tablet Take 1 tablet by mouth daily. (Patient not taking: Reported on 06/04/2023)     No current facility-administered medications for this visit.    Past Medical History:  Diagnosis Date   Anxiety    Aortic aneurysm (HCC)    Asthma    Bell's palsy    age 26 and age 26    Cancer (HCC)    COPD (chronic obstructive pulmonary disease) (HCC)    Deaf, right    Depression    Diabetes mellitus without complication (HCC)    Hearing aid worn    left   Hyperlipidemia    Hypertension    Shingles 08/13/2020   Varicose veins of legs    Vertigo    random, approx 1x/month   Wears dentures    full upper and lower    Past Surgical History:  Procedure Laterality Date   ABDOMINAL HYSTERECTOMY     BREAST EXCISIONAL BIOPSY Left 1976   neg   CATARACT EXTRACTION W/ INTRAOCULAR LENS  IMPLANT, BILATERAL     COLONOSCOPY  2014   normal   cyst on bladder removed     cyst removed breast Left    KNEE ARTHROSCOPY WITH MEDIAL MENISECTOMY Left 10/20/2019   Procedure: KNEE ARTHROSCOPY WITH PARTIAL MEDIAL MENISECTOMY;  Surgeon: Signa Kell, MD;  Location: [facility];  Service: Orthopedics;  Laterality: Left;  DIABETIC - oral meds   PARTIAL HYSTERECTOMY     TUBAL LIGATION       Social History   Tobacco Use   Smoking status: Former    Current packs/day: 0.00    Average packs/day: 2.0 packs/day for 10.0 years (20.0 ttl pk-yrs)    Types: Cigarettes    Start date: 05/10/1989    Quit date: 05/11/1999    Years since quitting: 24.0   Smokeless tobacco: Never   Tobacco comments:    Smoking cessation materials not required  Vaping Use   Vaping status: Never Used  Substance Use Topics    Alcohol use: No    Alcohol/week: 0.0 standard drinks of alcohol   Drug use: No      Family History  Problem Relation Age of Onset  Diabetes Mother    Stroke Mother    Healthy Daughter    Cancer Son 35       lung   Hypertension Son    Rheum arthritis Daughter    Hypertension Daughter    Arthritis Daughter    COPD Son    Breast cancer Neg Hx      Allergies  Allergen Reactions   Cefepime Rash   Penicillins Itching   Sulfa Antibiotics Itching   Iodinated Contrast Media Itching    Redness and itchiness   Vancomycin Rash     REVIEW OF SYSTEMS (Negative unless checked)   Constitutional: [x] Weight loss  [] Fever  [] Chills Cardiac: [] Chest pain   [] Chest pressure   [] Palpitations   [] Shortness of breath when laying flat   [x] Shortness of breath at rest   [x] Shortness of breath with exertion. Vascular:  [] Pain in legs with walking   [] Pain in legs at rest   [] Pain in legs when laying flat   [] Claudication   [] Pain in feet when walking  [] Pain in feet at rest  [] Pain in feet when laying flat   [] History of DVT   [] Phlebitis   [] Swelling in legs   [] Varicose veins   [] Non-healing ulcers Pulmonary:   [] Uses home oxygen   [] Productive cough   [] Hemoptysis   [] Wheeze  [x] COPD   [] Asthma Neurologic:  [] Dizziness  [] Blackouts   [] Seizures   [] History of stroke   [] History of TIA  [] Aphasia   [] Temporary blindness   [] Dysphagia   [] Weakness or numbness in arms   [] Weakness or numbness in legs Musculoskeletal:  [x] Arthritis   [] Joint swelling   [x] Joint pain   [] Low back pain Hematologic:  [] Easy bruising  [] Easy bleeding   [] Hypercoagulable state   [] Anemic   Gastrointestinal:  [] Blood in stool   [] Vomiting blood  [] Gastroesophageal reflux/heartburn   [] Abdominal pain Genitourinary:  [] Chronic kidney disease   [] Difficult urination  [] Frequent urination  [] Burning with urination   [] Hematuria Skin:  [] Rashes   [] Ulcers   [] Wounds Psychological:  [] History of anxiety   [x]  History of major  depression.  Physical Examination  BP (!) 154/77 (BP Location: Left Arm)   Pulse 67   Resp 19  Gen:  WD/WN, NAD Head: [place]/AT, + temporalis wasting. Ear/Nose/Throat: Hearing grossly intact, nares w/o erythema or drainage Eyes: Conjunctiva clear. Sclera non-icteric Neck: Supple.  Trachea midline Pulmonary:  Good air movement, no use of accessory muscles.  Cardiac: RRR, no JVD Vascular:  Vessel Right Left  Radial Palpable Palpable                          PT Palpable Palpable  DP Palpable Palpable   Gastrointestinal: soft, non-tender/non-distended. Increased aortic impulse. Musculoskeletal: M/S 5/5 throughout.  No deformity or atrophy. No LE edema. Neurologic: Sensation grossly intact in extremities.  Symmetrical.  Speech is fluent.  Psychiatric: Judgment intact, Mood & affect appropriate for pt's clinical situation. Dermatologic: No rashes or ulcers noted.  No cellulitis or open wounds.      Labs No results found for this or any previous visit (from the past 2160 hour(s)).  Radiology No results found.  Assessment/Plan Diabetes blood glucose control important in reducing the progression of atherosclerotic disease. Also, involved in wound healing. On appropriate medications.     BP (high blood pressure) blood pressure control important in reducing the progression of atherosclerotic disease and aneurysmal growth. On appropriate oral medications.  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  She has been getting multiple imaging studies due to her lung cancer with brain metastases which is responding well to treatment.  No focal neurologic symptoms.  No role for intervention with a chronic occlusion.  Discussed continuing her Eliquis.  We will plan a carotid duplex in 6  months and we will follow her with duplex largely for her right side going forward.  AAA (abdominal aortic aneurysm) without rupture (HCC) The patient has had significant growth of her abdominal aortic aneurysm over the past year.  It now measures 5.6 cm on duplex today.  This has grown almost a centimeter in less than a year and now is clearly a high risk aneurysm with its size as well as relatively rapid growth.  She is scheduled to get a CT scan of her chest next week and if we could add an abdomen and pelvis to that, that would be very helpful.  She did have a CT scan back in May that we can also use but this was also done at [facility] and I do not have the images for review today.  We will try to obtain these.  I have discussed with the patient and her family and will be in touch with her oncologist and pulmonologist to try to optimize her prior to surgery.  Risks and benefits of the procedure were discussed in detail and she desires very much to proceed.    Festus Barren, MD  06/04/2023 10:37 AM    This note was created with Dragon medical transcription system.  Any errors from dictation are purely unintentional

## 2023-06-04 NOTE — Assessment & Plan Note (Signed)
The patient has had significant growth of her abdominal aortic aneurysm over the past year.  It now measures 5.6 cm on duplex today.  This has grown almost a centimeter in less than a year and now is clearly a high risk aneurysm with its size as well as relatively rapid growth.  She is scheduled to get a CT scan of her chest next week and if we could add an abdomen and pelvis to that, that would be very helpful.  She did have a CT scan back in May that we can also use but this was also done at [facility] and I do not have the images for review today.  We will try to obtain these.  I have discussed with the patient and her family and will be in touch with her oncologist and pulmonologist to try to optimize her prior to surgery.  Risks and benefits of the procedure were discussed in detail and she desires very much to proceed.

## 2023-06-04 NOTE — Assessment & Plan Note (Signed)
The patient had a recent MRA which demonstrated a left carotid occlusion and there was mention this had previously been suggested on MRIs of her brain.  She has been getting multiple imaging studies due to her lung cancer with brain metastases which is responding well to treatment.  No focal neurologic symptoms.  No role for intervention with a chronic occlusion.  Discussed continuing her Eliquis.  We will plan a carotid duplex in 6 months and we will follow her with duplex largely for her right side going forward.

## 2023-06-07 ENCOUNTER — Encounter (INDEPENDENT_AMBULATORY_CARE_PROVIDER_SITE_OTHER): Payer: Self-pay | Admitting: Vascular Surgery

## 2023-06-09 NOTE — Telephone Encounter (Signed)
Sorry for the late response Dr Mariah Milling office

## 2023-06-10 ENCOUNTER — Other Ambulatory Visit: Payer: Self-pay | Admitting: Family Medicine

## 2023-06-10 DIAGNOSIS — F329 Major depressive disorder, single episode, unspecified: Secondary | ICD-10-CM

## 2023-06-10 NOTE — Telephone Encounter (Signed)
Requested Prescriptions  Pending Prescriptions Disp Refills   sertraline (ZOLOFT) 50 MG tablet [Pharmacy Med Name: Sertraline HCl 50 MG Oral Tablet] 90 tablet 0    Sig: Take 1 tablet by mouth once daily     Psychiatry:  Antidepressants - SSRI - sertraline Passed - 06/10/2023  9:17 AM      Passed - AST in normal range and within 360 days    AST  Date Value Ref Range Status  06/24/2022 13 0 - 40 IU/L Final         Passed - ALT in normal range and within 360 days    ALT  Date Value Ref Range Status  06/24/2022 11 0 - 32 IU/L Final         Passed - Completed PHQ-2 or PHQ-9 in the last 360 days      Passed - Valid encounter within last 6 months    Recent Outpatient Visits           3 months ago Stress incontinence of urine   [facility]   4 months ago Essential hypertension   [facility] [facility]   8 months ago Reactive depression   [facility]   9 months ago Chronic obstructive pulmonary disease, unspecified COPD type [facility])   [place] [facility]   9 months ago Acute cough   [place] [facility]       Future Appointments             In 6 days Gollan, Tollie Pizza, MD [facility] [place]   In 2 months Duanne Limerick, MD [facility] at [facility], [facility]

## 2023-06-13 DIAGNOSIS — R911 Solitary pulmonary nodule: Secondary | ICD-10-CM | POA: Diagnosis not present

## 2023-06-13 DIAGNOSIS — C3490 Malignant neoplasm of unspecified part of unspecified bronchus or lung: Secondary | ICD-10-CM | POA: Diagnosis not present

## 2023-06-13 DIAGNOSIS — M8938 Hypertrophy of bone, other site: Secondary | ICD-10-CM | POA: Diagnosis not present

## 2023-06-13 DIAGNOSIS — J9811 Atelectasis: Secondary | ICD-10-CM | POA: Diagnosis not present

## 2023-06-13 DIAGNOSIS — C7931 Secondary malignant neoplasm of brain: Secondary | ICD-10-CM | POA: Diagnosis not present

## 2023-06-13 DIAGNOSIS — I7143 Infrarenal abdominal aortic aneurysm, without rupture: Secondary | ICD-10-CM | POA: Diagnosis not present

## 2023-06-15 DIAGNOSIS — C3490 Malignant neoplasm of unspecified part of unspecified bronchus or lung: Secondary | ICD-10-CM | POA: Diagnosis not present

## 2023-06-15 DIAGNOSIS — Z5111 Encounter for antineoplastic chemotherapy: Secondary | ICD-10-CM | POA: Diagnosis not present

## 2023-06-15 DIAGNOSIS — Z1159 Encounter for screening for other viral diseases: Secondary | ICD-10-CM | POA: Diagnosis not present

## 2023-06-15 DIAGNOSIS — C7931 Secondary malignant neoplasm of brain: Secondary | ICD-10-CM | POA: Diagnosis not present

## 2023-06-15 DIAGNOSIS — R9389 Abnormal findings on diagnostic imaging of other specified body structures: Secondary | ICD-10-CM | POA: Diagnosis not present

## 2023-06-15 NOTE — Progress Notes (Unsigned)
Cardiology Office Note  Date:  06/16/2023   ID:  Jill Guzman, DOB November 17, 1940, MRN [id number]  PCP:  Jill Limerick, MD   Chief Complaint  Patient presents with   New Patient (Initial Visit)    New patient, AAA, medications reviewed verbally. Pt feels tired today.     HPI:  [name] is a 82 year old woman with past medical history of PAD AAA, carotid stenosis, iliac artery aneurysm Diabetes Former smoker  30s to 82s,  [employer]ile/cotton [employer] COPD, non-small cell lung cancer, on chemo Hypertension Pulmonary embolism May 2024 Who presents by referral from Dr. Wyn Quaker for preop evaluation for AAA repair with endograft  Presents with family on today's visit Reports that she is active at baseline, yard work, ADLs, housework Denies any prior cardiac history, denies chest pain or shortness of breath concerning for angina  CT scan abdomen pelvis images pulled up and reviewed showing moderate aortic atherosclerosis, AAA  Discussed carotid ultrasound showing occluded left carotid, seen on [facility] at [facility]  History of pulmonary embolism, treated with Eliquis, maintained on 5 twice daily 5/24: embolism CT chest Bilateral pulmonary emboli involving the right upper and left lower lobe segmental arteries.   Discussed oncologic history ED 06/29/22 with slurred speech, mispronouncing words, stuttering increased fatigue and poor coordination.  two brain mets, [employer] SRT completed 07/24/2022.  SRS to brain has been completed on 09/08/22. weaned off steroids.  08/19/2022, she was admitted after RSV, and was on 2 L of oxygen, was given fluids, and was subsequently discharged.   PET scan performed on 09/17/2022 demonstrated improvement in spiculated LUL nodule, and stable mediastinal lymph node.  09/08/22 completed RT to primary lung mass and R temporal lesion , tolerated well.  MRI 10/21/2022 showed good control @ radiated locations in brain, but also a new 2mm R posteroparietal lesion.   CT  scan abdomen pelvis Enlargement of the distal abdominal aortic aneurysm measuring up to 4.8 cm and measured 4.5 cm on 07/31/2020. This abdominal aortic aneurysm has saccular characteristics  Lab work reviewed A1c 6.7 Total cholesterol 191  EKG personally reviewed by myself on todays visit EKG Interpretation Date/Time:  Wednesday June 16 2023 10:10:22 EDT Ventricular Rate:  70 PR Interval:  204 QRS Duration:  90 QT Interval:  382 QTC Calculation: 412 R Axis:   21  Text Interpretation: Sinus rhythm with Premature atrial complexes When compared with ECG of 02-Jun-1997 15:50, Premature atrial complexes are now Present Confirmed by Julien Nordmann [phone]) on 06/16/2023 10:13:37 AM    PMH:   has a past medical history of Anxiety, Aortic aneurysm (HCC), Asthma, Bell's palsy, Cancer (HCC), COPD (chronic obstructive pulmonary disease) (HCC), Deaf, right, Depression, Diabetes mellitus without complication (HCC), Hearing aid worn, Hyperlipidemia, Hypertension, Shingles (08/13/2020), Varicose veins of legs, Vertigo, and Wears dentures.  PSH:    Past Surgical History:  Procedure Laterality Date   ABDOMINAL HYSTERECTOMY     BREAST EXCISIONAL BIOPSY Left 1976   neg   CATARACT EXTRACTION W/ INTRAOCULAR LENS  IMPLANT, BILATERAL     COLONOSCOPY  2014   normal   cyst on bladder removed     cyst removed breast Left    KNEE ARTHROSCOPY WITH MEDIAL MENISECTOMY Left 10/20/2019   Procedure: KNEE ARTHROSCOPY WITH PARTIAL MEDIAL MENISECTOMY;  Surgeon: Signa Kell, MD;  Location: [facility];  Service: Orthopedics;  Laterality: Left;  DIABETIC - oral meds   PARTIAL HYSTERECTOMY     TUBAL LIGATION      Current Outpatient  Medications  Medication Sig Dispense Refill   ACCU-CHEK GUIDE test strip USE 1 STRIP TO CHECK GLUCOSE FOR BLOOD SUGAR THREE TIMES DAILY AND FOR SYMPTOMS OF HIGH OR LOW BLOOD SUGAR     acetaminophen (TYLENOL) 500 MG tablet Take by mouth.     albuterol (VENTOLIN HFA) 108  (90 Base) MCG/ACT inhaler INHALE 1 PUFF BY MOUTH 4 TIMES DAILY 18 g 5   amLODipine (NORVASC) 5 MG tablet Take 1 tablet (5 mg total) by mouth daily. 90 tablet 3   apixaban (ELIQUIS) 5 MG TABS tablet Take 5 mg by mouth 2 (two) times daily.     Blood Glucose Monitoring Suppl (ACCU-CHEK GUIDE ME) w/Device KIT Test once daily 1 kit 0   budesonide-formoterol (SYMBICORT) 80-4.5 MCG/ACT inhaler Inhale 2 puffs into the lungs 2 (two) times daily. 1 each 12   cetirizine (ZYRTEC) 10 MG tablet Take 10 mg by mouth as needed for allergies. otc     Cholecalciferol (VITAMIN D) 50 MCG (2000 UT) CAPS Take by mouth.     dexamethasone (DECADRON) 4 MG tablet Take 2 tablets by mouth daily.     ezetimibe (ZETIA) 10 MG tablet Take 1 tablet (10 mg total) by mouth daily. 90 tablet 3   famotidine (PEPCID) 20 MG tablet Take by mouth.     fluticasone (FLONASE) 50 MCG/ACT nasal spray Place 2 sprays into both nostrils daily.     folic acid (FOLVITE) 1 MG tablet Take 1 mg by mouth daily.     HUMALOG KWIKPEN 100 UNIT/ML KwikPen Inject into the skin. Sliding scale as needed per endo     ipratropium-albuterol (DUONEB) 0.5-2.5 (3) MG/3ML SOLN USE 1 AMPULE IN NEBULIZER EVERY 6 HOURS AS NEEDED 360 mL 0   LANTUS SOLOSTAR 100 UNIT/ML Solostar Pen Inject into the skin as needed.     lidocaine-prilocaine (EMLA) cream Apply to small amount to port site and cover with occlusive dressing 1 hr prior to access.     meloxicam (MOBIC) 7.5 MG tablet Take by mouth.     metFORMIN (GLUCOPHAGE) 500 MG tablet Take 1 tablet (500 mg total) by mouth 2 (two) times daily. (Patient taking differently: Take 500 mg by mouth 2 (two) times daily with a meal. Pt taking 500 mg in AM and 500mg  at night- endocrinology) 180 tablet 6   montelukast (SINGULAIR) 10 MG tablet TAKE 1 TABLET BY MOUTH AT BEDTIME 90 tablet 0   OLANZapine (ZYPREXA) 5 MG tablet Take 5 mg by mouth at bedtime.     ondansetron (ZOFRAN) 8 MG tablet Take 8 mg by mouth every 8 (eight) hours as  needed.     oxyCODONE (OXY IR/ROXICODONE) 5 MG immediate release tablet Take 5 mg by mouth every 4 (four) hours as needed.     predniSONE (DELTASONE) 50 MG tablet Take by mouth.     prochlorperazine (COMPAZINE) 10 MG tablet Take 10 mg by mouth every 6 (six) hours as needed.     RELION PEN NEEDLES 32G X 4 MM MISC      sertraline (ZOLOFT) 50 MG tablet Take 1 tablet by mouth once daily 90 tablet 0   atorvastatin (LIPITOR) 20 MG tablet Take 1 tablet (20 mg total) by mouth daily. 90 tablet 3   losartan (COZAAR) 50 MG tablet Take 1 tablet (50 mg total) by mouth 2 (two) times daily. 180 tablet 3   No current facility-administered medications for this visit.     Allergies:   Cefepime, Penicillins, Sulfa antibiotics, Iodinated contrast media,  and Vancomycin   Social History:  The patient  reports that she quit smoking about 24 years ago. Her smoking use included cigarettes. She started smoking about 34 years ago. She has a 20 pack-year smoking history. She has never used smokeless tobacco. She reports that she does not drink alcohol and does not use drugs.   Family History:   family history includes Arthritis in her daughter; COPD in her son; Cancer (age of onset: 72) in her son; Diabetes in her mother; Healthy in her daughter; Hypertension in her daughter and son; Rheum arthritis in her daughter; Stroke in her mother.    Review of Systems: Review of Systems  Constitutional: Negative.   HENT: Negative.    Respiratory: Negative.    Cardiovascular: Negative.   Gastrointestinal: Negative.   Musculoskeletal: Negative.   Neurological: Negative.   Psychiatric/Behavioral: Negative.    All other systems reviewed and are negative.    PHYSICAL EXAM: VS:  BP (!) 170/90 (BP Location: Right Arm, Patient Position: Sitting, Cuff Size: Normal)   Pulse 70   Ht 5\' 2"  (1.575 m)   Wt 112 lb 12.8 oz (51.2 kg)   SpO2 95%   BMI 20.63 kg/m  , BMI Body mass index is 20.63 kg/m. GEN: Well nourished, well  developed, in no acute distress HEENT: normal Neck: no JVD, carotid bruits, or masses Cardiac: RRR; no murmurs, rubs, or gallops,no edema  Respiratory:  clear to auscultation bilaterally, normal work of breathing GI: soft, nontender, nondistended, + BS MS: no deformity or atrophy Skin: warm and dry, no rash Neuro:  Strength and sensation are intact Psych: euthymic mood, full affect   Recent Labs: 06/24/2022: ALT 11; BUN 12; Creatinine, Ser 0.80; Potassium 4.4; Sodium 140    Lipid Panel Lab Results  Component Value Date   CHOL 191 06/24/2022   HDL 55 06/24/2022   LDLCALC 115 (H) 06/24/2022   TRIG 120 06/24/2022      Wt Readings from Last 3 Encounters:  06/16/23 112 lb 12.8 oz (51.2 kg)  03/10/23 113 lb (51.3 kg)  03/04/23 113 lb (51.3 kg)      ASSESSMENT AND PLAN:  Problem List Items Addressed This Visit       Cardiology Problems   AAA (abdominal aortic aneurysm) without rupture (HCC) - Primary   Relevant Medications   atorvastatin (LIPITOR) 20 MG tablet   ezetimibe (ZETIA) 10 MG tablet   losartan (COZAAR) 50 MG tablet   amLODipine (NORVASC) 5 MG tablet   Other Relevant Orders   EKG 12-Lead (Completed)   Carotid stenosis   Relevant Medications   atorvastatin (LIPITOR) 20 MG tablet   ezetimibe (ZETIA) 10 MG tablet   losartan (COZAAR) 50 MG tablet   amLODipine (NORVASC) 5 MG tablet   Other Relevant Orders   EKG 12-Lead (Completed)   BP (high blood pressure)   Relevant Medications   atorvastatin (LIPITOR) 20 MG tablet   ezetimibe (ZETIA) 10 MG tablet   losartan (COZAAR) 50 MG tablet   amLODipine (NORVASC) 5 MG tablet   Iliac artery aneurysm (HCC)   Relevant Medications   atorvastatin (LIPITOR) 20 MG tablet   ezetimibe (ZETIA) 10 MG tablet   losartan (COZAAR) 50 MG tablet   amLODipine (NORVASC) 5 MG tablet   Familial multiple lipoprotein-type hyperlipidemia   Relevant Medications   atorvastatin (LIPITOR) 20 MG tablet   ezetimibe (ZETIA) 10 MG tablet    losartan (COZAAR) 50 MG tablet   amLODipine (NORVASC) 5 MG tablet  Other   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)   Diabetes (HCC)   Relevant Medications   atorvastatin (LIPITOR) 20 MG tablet   losartan (COZAAR) 50 MG tablet   Other Visit Diagnoses     Essential hypertension       Relevant Medications   atorvastatin (LIPITOR) 20 MG tablet   ezetimibe (ZETIA) 10 MG tablet   losartan (COZAAR) 50 MG tablet   amLODipine (NORVASC) 5 MG tablet      Preop cardiovascular evaluation Preparing for AAA repair via endograft placement Increase in size over the past 2 years 4.8 cm Images pulled up and reviewed with her in detail Acceptable risk for procedure, no further cardiac testing needed Denies anginal symptoms Stressed importance of aggressive blood pressure control prior to procedure  Essential hypertension Recent blood pressures have been running high, recommend she closely monitor blood pressure at home Recommend she increase losartan up to 50 twice daily from daily, add amlodipine 5 daily She reports when she had RSV many of her blood pressure medications were held presumably for hypotension Recommend she call us with blood pressure measurements over the next week  Aortic atherosclerosis Images pulled up and reviewed with her Stressed importance of aggressive lipid management  Hyperlipidemia Recommend she increase Lipitor from 10 up to 20, add Zetia 10 mg daily  Pulmonary embolism Reports she is followed by pulmonary, maintained on Eliquis 5 twice daily  PAD Carotid stenosis/occlusion on left, asymptomatic Followed by vascular   Total encounter time more than 60 minutes  Greater than 50% was spent in counseling and coordination of care with the patient    Signed, Dossie Arbour, M.D., Ph.D. [facility] [place], [place] [phone]

## 2023-06-16 ENCOUNTER — Encounter: Payer: Self-pay | Admitting: Cardiovascular Disease

## 2023-06-16 ENCOUNTER — Ambulatory Visit: Payer: Medicare Other | Attending: Cardiovascular Disease | Admitting: Cardiovascular Disease

## 2023-06-16 VITALS — BP 170/90 | HR 70 | Ht 62.0 in | Wt 112.8 lb

## 2023-06-16 DIAGNOSIS — E7849 Other hyperlipidemia: Secondary | ICD-10-CM

## 2023-06-16 DIAGNOSIS — I6522 Occlusion and stenosis of left carotid artery: Secondary | ICD-10-CM

## 2023-06-16 DIAGNOSIS — I723 Aneurysm of iliac artery: Secondary | ICD-10-CM

## 2023-06-16 DIAGNOSIS — C3490 Malignant neoplasm of unspecified part of unspecified bronchus or lung: Secondary | ICD-10-CM

## 2023-06-16 DIAGNOSIS — E119 Type 2 diabetes mellitus without complications: Secondary | ICD-10-CM

## 2023-06-16 DIAGNOSIS — C7931 Secondary malignant neoplasm of brain: Secondary | ICD-10-CM | POA: Diagnosis not present

## 2023-06-16 DIAGNOSIS — I7143 Infrarenal abdominal aortic aneurysm, without rupture: Secondary | ICD-10-CM | POA: Diagnosis not present

## 2023-06-16 DIAGNOSIS — I10 Essential (primary) hypertension: Secondary | ICD-10-CM | POA: Diagnosis not present

## 2023-06-16 MED ORDER — ATORVASTATIN CALCIUM 20 MG PO TABS: 20.0000 mg | ORAL_TABLET | Freq: Every day | ORAL | 3 refills | Status: DC

## 2023-06-16 MED ORDER — EZETIMIBE 10 MG PO TABS: 10.0000 mg | ORAL_TABLET | Freq: Every day | ORAL | 3 refills | Status: DC

## 2023-06-16 MED ORDER — AMLODIPINE BESYLATE 5 MG PO TABS
5.0000 mg | ORAL_TABLET | Freq: Every day | ORAL | 3 refills | Status: DC
Start: 2023-06-16 — End: 2023-08-16

## 2023-06-16 MED ORDER — LOSARTAN POTASSIUM 50 MG PO TABS: 50.0000 mg | ORAL_TABLET | Freq: Two times a day (BID) | ORAL | 3 refills | Status: DC

## 2023-06-16 NOTE — Patient Instructions (Addendum)
Medication Instructions:  Please increase atorvastatin up to 20 mg daily Start zetia 10 mg daily   Please increase losartan up to 50 twice a day Please restart amlodipine 5 mg daily  Please call with blood pressure  If you need a refill on your cardiac medications before your next appointment, please call your pharmacy.   Lab work: No new labs needed  Testing/Procedures: No new testing needed  Follow-Up: At [facility], you and your health needs are our priority.  As part of our continuing mission to provide you with exceptional heart care, we have created designated Provider Care Teams.  These Care Teams include your primary Cardiologist (physician) and Advanced Practice Providers (APPs -  Physician Assistants and Nurse Practitioners) who all work together to provide you with the care you need, when you need it.  You will need a follow up appointment in 12 months  Providers on your designated Care Team:   Nicolasa Ducking, NP Eula Listen, PA-C Cadence Fransico Michael, [place]  COVID-19 Vaccine Information can be found at: [email or web address] For questions related to vaccine distribution or appointments, please email [email or web address] or call [phone].

## 2023-06-17 ENCOUNTER — Telehealth (INDEPENDENT_AMBULATORY_CARE_PROVIDER_SITE_OTHER): Payer: Self-pay

## 2023-06-17 NOTE — Telephone Encounter (Signed)
Spoke with the patient's daughter and the patient is scheduled with Dr. Wyn Quaker on 06/30/23 with a 9:30 am arrival time to the [facility] for her AAA repair. Pre-op is scheduled on 06/21/23 with a2:00 pm arrival time to the [facility]. Pre-surgical instructions were discussed and will be sent to Mychart and mailed.

## 2023-06-21 ENCOUNTER — Other Ambulatory Visit: Payer: Self-pay

## 2023-06-21 ENCOUNTER — Encounter: Payer: Self-pay | Admitting: Vascular Surgery

## 2023-06-21 ENCOUNTER — Encounter
Admission: RE | Admit: 2023-06-21 | Discharge: 2023-06-21 | Disposition: A | Discharge status: Home / Self Care | Payer: Medicare Other | Source: Ambulatory Visit | Attending: Vascular Surgery | Admitting: Vascular Surgery

## 2023-06-21 VITALS — Ht 62.0 in | Wt 115.5 lb

## 2023-06-21 DIAGNOSIS — Z01812 Encounter for preprocedural laboratory examination: Secondary | ICD-10-CM

## 2023-06-21 DIAGNOSIS — E119 Type 2 diabetes mellitus without complications: Secondary | ICD-10-CM

## 2023-06-21 DIAGNOSIS — I714 Abdominal aortic aneurysm, without rupture, unspecified: Secondary | ICD-10-CM

## 2023-06-21 DIAGNOSIS — H353131 Nonexudative age-related macular degeneration, bilateral, early dry stage: Secondary | ICD-10-CM

## 2023-06-21 HISTORY — DX: Nonexudative age-related macular degeneration, bilateral, early dry stage: H35.3131

## 2023-06-21 HISTORY — DX: Type 2 diabetes mellitus with other specified complication: E11.69

## 2023-06-21 HISTORY — DX: Other specified disorders of bone density and structure, unspecified site: M85.80

## 2023-06-21 HISTORY — DX: Unspecified asthma, uncomplicated: J45.909

## 2023-06-21 HISTORY — DX: Malignant neoplasm of unspecified part of unspecified bronchus or lung: C34.90

## 2023-06-21 HISTORY — DX: Multiple subsegmental thrombotic pulmonary emboli without acute cor pulmonale: I26.94

## 2023-06-21 LAB — SURGICAL PCR SCREEN
MRSA, PCR: NEGATIVE
Staphylococcus aureus: NEGATIVE

## 2023-06-21 LAB — TYPE AND SCREEN
ABO/RH(D): A POS
Antibody Screen: NEGATIVE

## 2023-06-21 NOTE — Progress Notes (Signed)
Perioperative Services Pre-Admission/Anesthesia Testing   Date: 06/21/23 Name: Jill Guzman MRN:   [id number]  Re: Consideration of preoperative prophylactic antibiotic change   Request sent to: Annice Needy, MD and Sheppard Plumber, NP-C (routed and/or faxed via [facility])  Planned Surgical Procedure(s):    Case: [id number] Date/Time: 06/30/23 1030   Procedure: ENDOVASCULAR REPAIR/STENT GRAFT   Anesthesia type: General   Diagnosis: Abdominal aortic aneurysm (AAA) without rupture, unspecified part (HCC) [I71.40]   Pre-op diagnosis: AAA   GORE    AAA repair   Location: [facility]   Providers: Annice Needy, MD      Clinical Notes:  Patient has a documented allergy/intolerance to PCN  Advising that PCN has caused her to experience mild-mod pruritus with rash in the past.   EMR review indicated that patient received PCN and/or cephalosporin in the past as follows: CEFTRIAXONE (3rd generation cephalosporin) received on 08/18/2022 with no documented ADRs. Allergy section in EMR updated to reflect tolerance.    Screened as appropriate for cephalosporin use during medication reconciliation No immediate angioedema, dysphagia, SOB, anaphylaxis symptoms. No severe rash involving mucous membranes or skin necrosis. No hospital admissions related to side effects of PCN/cephalosporin use.  No documented reaction to PCN or cephalosporin in the last 10 years.  Request:  As an evidence based approach to reducing the rate of incidence for post-operative SSI and the development of MDROs, could an agent that allows for narrower antimicrobial coverage for preoperative prophylaxis in this patient's upcoming surgical course be considered?   Currently ordered preoperative prophylactic ABX: ciprofloxacin.   Specifically requesting change to cephalosporin (CEFAZOLIN).   Drug of choice for many procedures; it is the most widely studied antimicrobial agent with proven efficacy for  antimicrobial prophylaxis.   Desirable duration of action, spectrum of activity against organisms commonly encountered in surgery, and it has an excellent safety profile and low cost.   Active against streptococci, methicillin-susceptible staphylococci, and many gram-negative organisms.  Despite being a first-generation cephalosporin, cefazolin is structurally different than other cephalosporins. Cefazolin has two side chains (R1 and R2) that have structures that do not match those of any other FDA-approved ?-lactams or PCN.  True allergies to cefazolin are extremely rare (<1%) and are usually specific for its side chains, not the shared core ring.  No documented history of true IgE mediated reaction in this patient.   Patient is scheduled to receive diphenhydramine per surgeon's orders. Additionally, patients generally receive dexamethasone as part of their premedication regimen with anesthesia. The aforementioned interventions should mitigate any rash/itching experienced by the patient should these symptoms occur. Please communicate decision with me and I will change the orders in Epic as per your direction.    Things to consider: Many patients report that they were "allergic" to PCN earlier in life, however this does not translate into a true lifelong allergy. Patients can lose sensitivity to specific IgE antibodies over time if PCN is avoided (Kleris & Lugar, 2019).  Penicillin allergies are reported by 8% to 15% of the [place] population, but up to 95% of these allergies do not correspond to a true allergy when tested Cathren Laine et al., 2022).   Cross-sensitivity between PCN and cephalosporins has been documented as being as high as 10%, however this estimation included data believed to have been collected in a setting where there was contamination. Newer data suggests that the prevalence of cross-sensitivity between PCN and cephalosporins is actually estimated to be closer to 1% (Hermanides  et al.,  2018).   Patients labeled as PCN allergic, whether they are truly allergic or not, have been found to have inferior outcomes in terms of rates of serious infection, and these patients tend to have longer hospital stays [facility] & Lugar, 2019).  Treatment related secondary infections, such as Clostridioides difficile, have been linked to the improper use of broad spectrum antibiotics in patients improperly labeled as PCN allergic (Kleris & Lugar, 2019).  Anaphylaxis from cephalosporins is rare and the evidence suggests that there is no increased risk of an anaphylactic type reaction when cephalosporins are used in a PCN allergic patient (Pichichero, 2006).  Citations: Hermanides J, Lemkes BA, Prins Gwenyth Bender MW, Terreehorst I. Presumed ?-Lactam Allergy and Cross-reactivity in the Operating Theater: A Practical Approach. Anesthesiology. 2018 Aug;129(2):335-342. doi: 10.1097/ALN.0000000000002252. PMID: 78469629.  Kleris, R. S., & Lugar, P. L. (2019). Things We Do For No Reason: Failing to Question a Penicillin Allergy History. Journal of hospital medicine, 14(10), [phone]. Advance online publication. airportbarriers.com  Pichichero, M. E. (2006). Cephalosporins can be prescribed safely for penicillin-allergic patients. Journal of family medicine, 55(2), 106-112. Accessed: https://cdn.mdedge.com/files/s4fs-public/Document/September-2017/5502JFP_AppliedEvidence1.pdf  Fransisco Beau., & Senaida Ores, D. R. (2022). Understanding Penicillin Allergy, Cross-reactivity, and Antibiotic Selection in the Preoperative Setting. The Journal of the American Academy of Orthopaedic Surgeons, 30(1), e1-e5. CallRank.tn   Quentin Mulling, MSN, APRN, FNP-C, CEN [facility]  Perioperative Services Nurse Practitioner Phone: [phone] Fax: [phone] 06/21/23 3:06 PM  NOTE: This note has been prepared using Dragon dictation  software. Despite my best ability to proofread, there is always the potential that unintentional transcriptional errors may still occur from this process.

## 2023-06-21 NOTE — Patient Instructions (Addendum)
Your procedure is scheduled on:Wednesday October 23  Report to the Registration Desk on the 1st floor of the [facility] To find out your arrival time, please call [phone] between 1PM - 3PM on:  Tuesday October 22 If your arrival time is 6:00 am, do not arrive before that time as the [facility] entrance doors do not open until 6:00 am.  REMEMBER: Instructions that are not followed completely may result in serious medical risk, up to and including death; or upon the discretion of your surgeon and anesthesiologist your surgery may need to be rescheduled.  Do not eat food after midnight the night before surgery.  No gum chewing or hard candies.   One week prior to surgery: Wednesday October 16  Stop Anti-inflammatories (NSAIDS) such as Advil, Aleve, Ibuprofen, Motrin, Naproxen, Naprosyn and Aspirin based products such as Excedrin, Goody's Powder, BC Powder. meloxicam (MOBIC)   Stop ANY OVER THE COUNTER supplements until after surgery. Cholecalciferol (VITAMIN D)  folic acid (FOLVITE)  cetirizine (ZYRTEC)  You may however, continue to take Tylenol if needed for pain up until the day of surgery.  **Follow guidelines for insulin and diabetes medications.**   the normal dose of insulin the night prior to surgery NO INSULIN MORNING OF SURGERY  **Follow recommendations regarding stopping blood thinners.** apixaban (ELIQUIS) hold 2 days prior to surgery  Continue taking all of your other prescription medications up until the day of surgery. metFORMIN (GLUCOPHAGE) hold 2 days prior to surgery   ON THE DAY OF SURGERY ONLY TAKE THESE MEDICATIONS WITH SIPS OF WATER:  amLODipine (NORVASC)  atorvastatin (LIPITOR)  dexamethasone (DECADRON)  ezetimibe (ZETIA)  famotidine (PEPCID)  predniSONE (DELTASONE)  prochlorperazine (COMPAZINE)  sertraline (ZOLOFT)   Use inhalers on the day of surgery and bring to the hospital. albuterol (VENTOLIN HFA)  budesonide-formoterol (SYMBICORT)     No Alcohol for 24 hours before or after surgery.  No Smoking including e-cigarettes for 24 hours before surgery.  No chewable tobacco products for at least 6 hours before surgery.  No nicotine patches on the day of surgery.  Do not use any "recreational" drugs for at least a week (preferably 2 weeks) before your surgery.  Please be advised that the combination of cocaine and anesthesia may have negative outcomes, up to and including death. If you test positive for cocaine, your surgery will be cancelled.  On the morning of surgery brush your teeth with toothpaste and water, you may rinse your mouth with mouthwash if you wish. Do not swallow any toothpaste or mouthwash.  Use CHG wipes as directed on instruction sheet.  Do not wear jewelry, make-up, hairpins, clips or nail polish.  For welded (permanent) jewelry: bracelets, anklets, waist bands, etc.  Please have this removed prior to surgery.  If it is not removed, there is a chance that hospital personnel will need to cut it off on the day of surgery.  Do not wear lotions, powders, or perfumes.   Do not shave body hair from the neck down 48 hours before surgery.  Contact lenses, hearing aids and dentures may not be worn into surgery.  Do not bring valuables to the hospital. [facility] is not responsible for any missing/lost belongings or valuables.   Notify your doctor if there is any change in your medical condition (cold, fever, infection).  Wear comfortable clothing (specific to your surgery type) to the hospital.  After surgery, you can help prevent lung complications by doing breathing exercises.  Take deep breaths  and cough every 1-2 hours.   If you are being admitted to the hospital overnight, leave your suitcase in the car. After surgery it may be brought to your room.  In case of increased patient census, it may be necessary for you, the patient, to continue your postoperative care in the Same Day Surgery  department.  If you are being discharged the day of surgery, you will not be allowed to drive home. You will need a responsible individual to drive you home and stay with you for 24 hours after surgery.   If you are taking public transportation, you will need to have a responsible individual with you.  Please call the Pre-admissions Testing Dept. at [phone] if you have any questions about these instructions.  Surgery Visitation Policy:  Patients having surgery or a procedure may have two visitors.  Children under the age of 59 must have an adult with them who is not the patient.  Inpatient Visitation:    Visiting hours are 7 a.m. to 8 p.m. Up to four visitors are allowed at one time in a patient room. The visitors may rotate out with other people during the day.  One visitor age 66 or older may stay with the patient overnight and must be in the room by 8 p.m.           Preparing for Surgery with CHLORHEXIDINE GLUCONATE (CHG) Soap  Chlorhexidine Gluconate (CHG) Soap  o An antiseptic cleaner that kills germs and bonds with the skin to continue killing germs even after washing  o Used for showering the night before surgery and morning of surgery  Before surgery, you can play an important role by reducing the number of germs on your skin.  CHG (Chlorhexidine gluconate) soap is an antiseptic cleanser which kills germs and bonds with the skin to continue killing germs even after washing.  Please do not use if you have an allergy to CHG or antibacterial soaps. If your skin becomes reddened/irritated stop using the CHG.  1. Shower the NIGHT BEFORE SURGERY and the MORNING OF SURGERY with CHG soap.  2. If you choose to wash your hair, wash your hair first as usual with your normal shampoo.  3. After shampooing, rinse your hair and body thoroughly to remove the shampoo.  4. Use CHG as you would any other liquid soap. You can apply CHG directly to the skin and wash gently  with a scrungie or a clean washcloth.  5. Apply the CHG soap to your body only from the neck down. Do not use on open wounds or open sores. Avoid contact with your eyes, ears, mouth, and genitals (private parts). Wash face and genitals (private parts) with your normal soap.  6. Wash thoroughly, paying special attention to the area where your surgery will be performed.  7. Thoroughly rinse your body with warm water.  8. Do not shower/wash with your normal soap after using and rinsing off the CHG soap.  9. Pat yourself dry with a clean towel.  10. Wear clean pajamas to bed the night before surgery.  12. Place clean sheets on your bed the night of your first shower and do not sleep with pets.  13. Shower again with the CHG soap on the day of surgery prior to arriving at the hospital.  14. Do not apply any deodorants/lotions/powders.  15. Please wear clean clothes to the hospital.

## 2023-06-21 NOTE — Progress Notes (Signed)
  Perioperative Services Pre-Admission/Anesthesia Testing     Date: 06/21/23  Name: DERICKA OSTENSON MRN:   [id number]  Re: Change in ABX for upcoming surgery   Case: [id number] Date/Time: 06/30/23 1030   Procedure: ENDOVASCULAR REPAIR/STENT GRAFT   Anesthesia type: General   Diagnosis: Abdominal aortic aneurysm (AAA) without rupture, unspecified part (HCC) [I71.40]   Pre-op diagnosis: AAA   GORE    AAA repair   Location: [facility]   Providers: Annice Needy, MD     Primary attending surgeon was consulted regarding consideration of therapeutic change in antimicrobial agent being used for preoperative prophylaxis in this patient's upcoming surgical case. Following analysis of the risk versus benefits, the patient's primary attending surgeon advised that it would be acceptable to discontinue the ordered ciprofloxacin and place an order for cefazolin 2 gm IV to be given on call to the OR. Orders for this patient were amended by me following collaborative conversation with attending surgeon taking into consideration of risk versus benefits associated with the change in therapy.  Quentin Mulling, MSN, APRN, FNP-C, CEN [facility]  Perioperative Services Nurse Practitioner Phone: [phone] Fax: [phone] 06/21/23 3:46 PM  NOTE: This note has been prepared using Dragon dictation software. Despite my best ability to proofread, there is always the potential that unintentional transcriptional errors may still occur from this process.

## 2023-06-30 ENCOUNTER — Encounter
Admission: RE | Disposition: A | Discharge status: Home / Self Care | Payer: Self-pay | Source: Home / Self Care | Attending: Vascular Surgery

## 2023-06-30 ENCOUNTER — Encounter: Payer: Self-pay | Admitting: Vascular Surgery

## 2023-06-30 ENCOUNTER — Inpatient Hospital Stay: Payer: Medicare Other | Admitting: Urgent Care

## 2023-06-30 ENCOUNTER — Inpatient Hospital Stay
Admission: RE | Admit: 2023-06-30 | Discharge: 2023-07-01 | DRG: 269 | Disposition: A | Discharge status: Home / Self Care | Payer: Medicare Other | Attending: Vascular Surgery | Admitting: Vascular Surgery

## 2023-06-30 ENCOUNTER — Other Ambulatory Visit: Payer: Self-pay

## 2023-06-30 DIAGNOSIS — H9191 Unspecified hearing loss, right ear: Secondary | ICD-10-CM | POA: Diagnosis present

## 2023-06-30 DIAGNOSIS — Z86711 Personal history of pulmonary embolism: Secondary | ICD-10-CM | POA: Diagnosis not present

## 2023-06-30 DIAGNOSIS — C3490 Malignant neoplasm of unspecified part of unspecified bronchus or lung: Secondary | ICD-10-CM | POA: Diagnosis not present

## 2023-06-30 DIAGNOSIS — E785 Hyperlipidemia, unspecified: Secondary | ICD-10-CM | POA: Diagnosis not present

## 2023-06-30 DIAGNOSIS — Z9842 Cataract extraction status, left eye: Secondary | ICD-10-CM

## 2023-06-30 DIAGNOSIS — M8580 Other specified disorders of bone density and structure, unspecified site: Secondary | ICD-10-CM | POA: Diagnosis not present

## 2023-06-30 DIAGNOSIS — Z87891 Personal history of nicotine dependence: Secondary | ICD-10-CM

## 2023-06-30 DIAGNOSIS — Z7951 Long term (current) use of inhaled steroids: Secondary | ICD-10-CM

## 2023-06-30 DIAGNOSIS — E1169 Type 2 diabetes mellitus with other specified complication: Secondary | ICD-10-CM | POA: Diagnosis present

## 2023-06-30 DIAGNOSIS — J4489 Other specified chronic obstructive pulmonary disease: Secondary | ICD-10-CM | POA: Diagnosis not present

## 2023-06-30 DIAGNOSIS — Z880 Allergy status to penicillin: Secondary | ICD-10-CM | POA: Diagnosis not present

## 2023-06-30 DIAGNOSIS — H353131 Nonexudative age-related macular degeneration, bilateral, early dry stage: Secondary | ICD-10-CM | POA: Diagnosis not present

## 2023-06-30 DIAGNOSIS — Z881 Allergy status to other antibiotic agents status: Secondary | ICD-10-CM

## 2023-06-30 DIAGNOSIS — C801 Malignant (primary) neoplasm, unspecified: Secondary | ICD-10-CM

## 2023-06-30 DIAGNOSIS — Z882 Allergy status to sulfonamides status: Secondary | ICD-10-CM

## 2023-06-30 DIAGNOSIS — I10 Essential (primary) hypertension: Secondary | ICD-10-CM | POA: Diagnosis present

## 2023-06-30 DIAGNOSIS — Z7901 Long term (current) use of anticoagulants: Secondary | ICD-10-CM | POA: Diagnosis not present

## 2023-06-30 DIAGNOSIS — Z8249 Family history of ischemic heart disease and other diseases of the circulatory system: Secondary | ICD-10-CM | POA: Diagnosis not present

## 2023-06-30 DIAGNOSIS — C7931 Secondary malignant neoplasm of brain: Secondary | ICD-10-CM | POA: Diagnosis present

## 2023-06-30 DIAGNOSIS — I7143 Infrarenal abdominal aortic aneurysm, without rupture: Secondary | ICD-10-CM | POA: Diagnosis not present

## 2023-06-30 DIAGNOSIS — Z794 Long term (current) use of insulin: Secondary | ICD-10-CM

## 2023-06-30 DIAGNOSIS — I872 Venous insufficiency (chronic) (peripheral): Secondary | ICD-10-CM | POA: Diagnosis not present

## 2023-06-30 DIAGNOSIS — Z888 Allergy status to other drugs, medicaments and biological substances status: Secondary | ICD-10-CM

## 2023-06-30 DIAGNOSIS — Z90711 Acquired absence of uterus with remaining cervical stump: Secondary | ICD-10-CM

## 2023-06-30 DIAGNOSIS — Z91041 Radiographic dye allergy status: Secondary | ICD-10-CM

## 2023-06-30 DIAGNOSIS — Z79899 Other long term (current) drug therapy: Secondary | ICD-10-CM

## 2023-06-30 DIAGNOSIS — I714 Abdominal aortic aneurysm, without rupture, unspecified: Principal | ICD-10-CM | POA: Diagnosis present

## 2023-06-30 DIAGNOSIS — Z7984 Long term (current) use of oral hypoglycemic drugs: Secondary | ICD-10-CM

## 2023-06-30 DIAGNOSIS — Z961 Presence of intraocular lens: Secondary | ICD-10-CM | POA: Diagnosis not present

## 2023-06-30 DIAGNOSIS — E119 Type 2 diabetes mellitus without complications: Secondary | ICD-10-CM

## 2023-06-30 DIAGNOSIS — Z01812 Encounter for preprocedural laboratory examination: Secondary | ICD-10-CM

## 2023-06-30 DIAGNOSIS — Z9841 Cataract extraction status, right eye: Secondary | ICD-10-CM

## 2023-06-30 HISTORY — DX: Complete loss of teeth, unspecified cause, unspecified class: Z97.2

## 2023-06-30 HISTORY — DX: Complete loss of teeth, unspecified cause, unspecified class: K08.109

## 2023-06-30 HISTORY — DX: Long term (current) use of anticoagulants: Z79.01

## 2023-06-30 HISTORY — PX: ENDOVASCULAR REPAIR/STENT GRAFT: CATH118280

## 2023-06-30 LAB — CBC
HCT: 35.7 % — ABNORMAL LOW (ref 36.0–46.0)
Hemoglobin: 11.5 g/dL — ABNORMAL LOW (ref 12.0–15.0)
MCH: 27.3 pg (ref 26.0–34.0)
MCHC: 32.2 g/dL (ref 30.0–36.0)
MCV: 84.8 fL (ref 80.0–100.0)
Platelets: 181 10*3/uL (ref 150–400)
RBC: 4.21 MIL/uL (ref 3.87–5.11)
RDW: 18.4 % — ABNORMAL HIGH (ref 11.5–15.5)
WBC: 5.3 10*3/uL (ref 4.0–10.5)
nRBC: 0 % (ref 0.0–0.2)

## 2023-06-30 LAB — GLUCOSE, CAPILLARY
Glucose-Capillary: 183 mg/dL — ABNORMAL HIGH (ref 70–99)
Glucose-Capillary: 189 mg/dL — ABNORMAL HIGH (ref 70–99)
Glucose-Capillary: 275 mg/dL — ABNORMAL HIGH (ref 70–99)
Glucose-Capillary: 227 mg/dL — ABNORMAL HIGH (ref 70–99)
Glucose-Capillary: 322 mg/dL — ABNORMAL HIGH (ref 70–99)

## 2023-06-30 LAB — BASIC METABOLIC PANEL
Anion gap: 7 (ref 5–15)
BUN: 15 mg/dL (ref 8–23)
CO2: 25 mmol/L (ref 22–32)
Calcium: 9.2 mg/dL (ref 8.9–10.3)
Chloride: 105 mmol/L (ref 98–111)
Creatinine, Ser: 0.6 mg/dL (ref 0.44–1.00)
GFR, Estimated: >60 mL/min (ref >60)
Glucose, Bld: 229 mg/dL — ABNORMAL HIGH (ref 70–99)
Potassium: 4.5 mmol/L (ref 3.5–5.1)
Sodium: 137 mmol/L (ref 135–145)

## 2023-06-30 LAB — PROTIME-INR
INR: 1.2 (ref 0.8–1.2)
Prothrombin Time: 15.4 s — ABNORMAL HIGH (ref 11.4–15.2)

## 2023-06-30 LAB — ABO/RH: ABO/RH(D): A POS

## 2023-06-30 LAB — APTT: aPTT: 84 s — ABNORMAL HIGH (ref 24–36)

## 2023-06-30 LAB — MAGNESIUM: Magnesium: 1.8 mg/dL (ref 1.7–2.4)

## 2023-06-30 SURGERY — ENDOVASCULAR STENT GRAFT (AAA)
Anesthesia: General

## 2023-06-30 MED ORDER — CEFAZOLIN SODIUM-DEXTROSE 2-4 GM/100ML-% IV SOLN
2.0000 g | Freq: Once | INTRAVENOUS | Status: AC
  Administered 2023-06-30: 2 g via INTRAVENOUS

## 2023-06-30 MED ORDER — CEFAZOLIN SODIUM-DEXTROSE 2-4 GM/100ML-% IV SOLN
INTRAVENOUS | Status: AC
Start: 2023-06-30 — End: ?
  Filled 2023-06-30: qty 100

## 2023-06-30 MED ORDER — ACETAMINOPHEN 325 MG PO TABS
325.0000 mg | ORAL_TABLET | ORAL | Status: DC | PRN
Start: 2023-06-30 — End: 2023-07-01

## 2023-06-30 MED ORDER — PROPOFOL 10 MG/ML IV BOLUS
INTRAVENOUS | Status: AC
  Filled 2023-06-30: qty 20

## 2023-06-30 MED ORDER — EZETIMIBE 10 MG PO TABS
10.0000 mg | ORAL_TABLET | Freq: Every day | ORAL | Status: DC
  Administered 2023-07-01: 10 mg via ORAL
  Filled 2023-06-30: qty 1

## 2023-06-30 MED ORDER — CHLORHEXIDINE GLUCONATE CLOTH 2 % EX PADS
6.0000 | MEDICATED_PAD | Freq: Once | CUTANEOUS | Status: AC
  Administered 2023-06-30: 6 via TOPICAL

## 2023-06-30 MED ORDER — ONDANSETRON HCL 4 MG/2ML IJ SOLN: 4.0000 mg | Freq: Four times a day (QID) | INTRAMUSCULAR | Status: DC | PRN

## 2023-06-30 MED ORDER — METHYLPREDNISOLONE SODIUM SUCC 125 MG IJ SOLR: 125.0000 mg | Freq: Once | INTRAMUSCULAR | Status: AC | PRN

## 2023-06-30 MED ORDER — CHLORHEXIDINE GLUCONATE 0.12 % MT SOLN
15.0000 mL | Freq: Once | OROMUCOSAL | Status: AC
  Administered 2023-06-30: 15 mL via OROMUCOSAL
  Filled 2023-06-30: qty 15

## 2023-06-30 MED ORDER — FENTANYL CITRATE (PF) 100 MCG/2ML IJ SOLN
INTRAMUSCULAR | Status: DC | PRN
  Administered 2023-06-30: 50 ug via INTRAVENOUS
  Administered 2023-06-30 (×2): 25 ug via INTRAVENOUS

## 2023-06-30 MED ORDER — ROCURONIUM BROMIDE 100 MG/10ML IV SOLN
INTRAVENOUS | Status: DC | PRN
  Administered 2023-06-30 (×2): 20 mg via INTRAVENOUS

## 2023-06-30 MED ORDER — SERTRALINE HCL 50 MG PO TABS
50.0000 mg | ORAL_TABLET | Freq: Every day | ORAL | Status: DC
  Administered 2023-06-30: 50 mg via ORAL
  Filled 2023-06-30 (×2): qty 1

## 2023-06-30 MED ORDER — ASPIRIN 81 MG PO TBEC
81.0000 mg | DELAYED_RELEASE_TABLET | Freq: Every day | ORAL | Status: DC
  Administered 2023-07-01: 81 mg via ORAL
  Filled 2023-06-30: qty 1

## 2023-06-30 MED ORDER — POTASSIUM CHLORIDE CRYS ER 20 MEQ PO TBCR: 20.0000 meq | EXTENDED_RELEASE_TABLET | Freq: Every day | ORAL | Status: DC | PRN

## 2023-06-30 MED ORDER — ROCURONIUM BROMIDE 10 MG/ML (PF) SYRINGE
PREFILLED_SYRINGE | INTRAVENOUS | Status: AC
  Filled 2023-06-30: qty 10

## 2023-06-30 MED ORDER — ALBUTEROL SULFATE (2.5 MG/3ML) 0.083% IN NEBU
2.5000 mg | INHALATION_SOLUTION | Freq: Four times a day (QID) | RESPIRATORY_TRACT | Status: DC
  Administered 2023-06-30 – 2023-07-01 (×3): 2.5 mg via RESPIRATORY_TRACT
  Filled 2023-06-30 (×4): qty 3

## 2023-06-30 MED ORDER — MOMETASONE FURO-FORMOTEROL FUM 100-5 MCG/ACT IN AERO
2.0000 | INHALATION_SPRAY | Freq: Two times a day (BID) | RESPIRATORY_TRACT | Status: DC
  Filled 2023-06-30: qty 8.8

## 2023-06-30 MED ORDER — SUCCINYLCHOLINE CHLORIDE 200 MG/10ML IV SOSY
PREFILLED_SYRINGE | INTRAVENOUS | Status: DC | PRN
  Administered 2023-06-30: 80 mg via INTRAVENOUS

## 2023-06-30 MED ORDER — OLANZAPINE 5 MG PO TABS
5.0000 mg | ORAL_TABLET | Freq: Every day | ORAL | Status: DC
  Filled 2023-06-30: qty 1

## 2023-06-30 MED ORDER — PHENOL 1.4 % MT LIQD: 1.0000 | OROMUCOSAL | Status: DC | PRN

## 2023-06-30 MED ORDER — BUDESONIDE-FORMOTEROL FUMARATE 160-4.5 MCG/ACT IN AERO
2.0000 | INHALATION_SPRAY | Freq: Two times a day (BID) | RESPIRATORY_TRACT | Status: DC
Start: 2023-06-30 — End: 2023-07-01
  Administered 2023-07-01: 2 via RESPIRATORY_TRACT
  Filled 2023-06-30: qty 6

## 2023-06-30 MED ORDER — SODIUM CHLORIDE 0.9 % IV SOLN: INTRAVENOUS | Status: DC

## 2023-06-30 MED ORDER — SUCCINYLCHOLINE CHLORIDE 200 MG/10ML IV SOSY
PREFILLED_SYRINGE | INTRAVENOUS | Status: AC
  Filled 2023-06-30: qty 10

## 2023-06-30 MED ORDER — DEXAMETHASONE SODIUM PHOSPHATE 10 MG/ML IJ SOLN
INTRAMUSCULAR | Status: AC
  Filled 2023-06-30: qty 1

## 2023-06-30 MED ORDER — HYDROMORPHONE HCL 1 MG/ML IJ SOLN: 1.0000 mg | Freq: Once | INTRAMUSCULAR | Status: DC | PRN

## 2023-06-30 MED ORDER — ONDANSETRON HCL 4 MG/2ML IJ SOLN
INTRAMUSCULAR | Status: DC | PRN
  Administered 2023-06-30: 4 mg via INTRAVENOUS

## 2023-06-30 MED ORDER — NITROGLYCERIN IN D5W 200-5 MCG/ML-% IV SOLN: 5.0000 ug/min | INTRAVENOUS | Status: DC

## 2023-06-30 MED ORDER — FAMOTIDINE IN NACL 20-0.9 MG/50ML-% IV SOLN
20.0000 mg | Freq: Two times a day (BID) | INTRAVENOUS | Status: DC
  Administered 2023-06-30 – 2023-07-01 (×2): 20 mg via INTRAVENOUS
  Filled 2023-06-30 (×2): qty 50

## 2023-06-30 MED ORDER — LABETALOL HCL 5 MG/ML IV SOLN: 10.0000 mg | INTRAVENOUS | Status: DC | PRN

## 2023-06-30 MED ORDER — SUGAMMADEX SODIUM 200 MG/2ML IV SOLN
INTRAVENOUS | Status: DC | PRN
  Administered 2023-06-30: 100 mg via INTRAVENOUS

## 2023-06-30 MED ORDER — FAMOTIDINE 20 MG PO TABS
20.0000 mg | ORAL_TABLET | Freq: Every day | ORAL | Status: DC
Start: 2023-06-30 — End: 2023-06-30

## 2023-06-30 MED ORDER — ALUM & MAG HYDROXIDE-SIMETH 200-200-20 MG/5ML PO SUSP: 15.0000 mL | ORAL | Status: DC | PRN

## 2023-06-30 MED ORDER — MONTELUKAST SODIUM 10 MG PO TABS
10.0000 mg | ORAL_TABLET | Freq: Every day | ORAL | Status: DC
  Administered 2023-06-30: 10 mg via ORAL
  Filled 2023-06-30: qty 1

## 2023-06-30 MED ORDER — MORPHINE SULFATE (PF) 2 MG/ML IV SOLN: 2.0000 mg | INTRAVENOUS | Status: DC | PRN

## 2023-06-30 MED ORDER — SODIUM CHLORIDE 0.9 % IV SOLN: 500.0000 mL | Freq: Once | INTRAVENOUS | Status: DC | PRN

## 2023-06-30 MED ORDER — DIPHENHYDRAMINE HCL 50 MG/ML IJ SOLN
INTRAMUSCULAR | Status: AC
  Administered 2023-06-30: 50 mg via INTRAVENOUS
  Filled 2023-06-30: qty 1

## 2023-06-30 MED ORDER — HEPARIN (PORCINE) IN NACL 1000-0.9 UT/500ML-% IV SOLN
INTRAVENOUS | Status: DC | PRN
  Administered 2023-06-30: 1500 mL

## 2023-06-30 MED ORDER — EPHEDRINE SULFATE-NACL 50-0.9 MG/10ML-% IV SOSY
PREFILLED_SYRINGE | INTRAVENOUS | Status: DC | PRN
  Administered 2023-06-30: 10 mg via INTRAVENOUS

## 2023-06-30 MED ORDER — APIXABAN 5 MG PO TABS
5.0000 mg | ORAL_TABLET | Freq: Two times a day (BID) | ORAL | Status: DC
  Administered 2023-07-01: 5 mg via ORAL
  Filled 2023-06-30: qty 1

## 2023-06-30 MED ORDER — FENTANYL CITRATE (PF) 100 MCG/2ML IJ SOLN
INTRAMUSCULAR | Status: AC
  Filled 2023-06-30: qty 2

## 2023-06-30 MED ORDER — OXYCODONE HCL 5 MG PO TABS
5.0000 mg | ORAL_TABLET | ORAL | Status: DC | PRN
  Administered 2023-06-30 – 2023-07-01 (×2): 5 mg via ORAL
  Filled 2023-06-30 (×2): qty 1

## 2023-06-30 MED ORDER — INSULIN ASPART 100 UNIT/ML IJ SOLN
0.0000 [IU] | Freq: Three times a day (TID) | INTRAMUSCULAR | Status: DC
  Administered 2023-06-30: 8 [IU] via SUBCUTANEOUS
  Administered 2023-07-01: 2 [IU] via SUBCUTANEOUS
  Administered 2023-07-01: 8 [IU] via SUBCUTANEOUS
  Filled 2023-06-30 (×3): qty 1

## 2023-06-30 MED ORDER — IODIXANOL 320 MG/ML IV SOLN
INTRAVENOUS | Status: DC | PRN
  Administered 2023-06-30: 60 mL

## 2023-06-30 MED ORDER — MIDAZOLAM HCL 2 MG/2ML IJ SOLN
INTRAMUSCULAR | Status: AC
Start: 2023-06-30 — End: ?
  Filled 2023-06-30: qty 2

## 2023-06-30 MED ORDER — FLUTICASONE PROPIONATE 50 MCG/ACT NA SUSP
2.0000 | Freq: Every day | NASAL | Status: DC
  Filled 2023-06-30: qty 16

## 2023-06-30 MED ORDER — ONDANSETRON HCL 4 MG/2ML IJ SOLN
INTRAMUSCULAR | Status: AC
  Filled 2023-06-30: qty 2

## 2023-06-30 MED ORDER — METOPROLOL TARTRATE 5 MG/5ML IV SOLN: 2.0000 mg | INTRAVENOUS | Status: DC | PRN

## 2023-06-30 MED ORDER — AMLODIPINE BESYLATE 5 MG PO TABS
5.0000 mg | ORAL_TABLET | Freq: Every day | ORAL | Status: DC
  Filled 2023-06-30: qty 1

## 2023-06-30 MED ORDER — ACETAMINOPHEN 500 MG PO TABS: 500.0000 mg | ORAL_TABLET | Freq: Four times a day (QID) | ORAL | Status: DC | PRN

## 2023-06-30 MED ORDER — ORAL CARE MOUTH RINSE
15.0000 mL | Freq: Once | OROMUCOSAL | Status: AC
  Filled 2023-06-30: qty 15

## 2023-06-30 MED ORDER — LIDOCAINE HCL (PF) 2 % IJ SOLN
INTRAMUSCULAR | Status: AC
  Filled 2023-06-30: qty 5

## 2023-06-30 MED ORDER — HYDRALAZINE HCL 20 MG/ML IJ SOLN: 5.0000 mg | INTRAMUSCULAR | Status: DC | PRN

## 2023-06-30 MED ORDER — INSULIN ASPART 100 UNIT/ML IJ SOLN: 0.0000 [IU] | Freq: Three times a day (TID) | INTRAMUSCULAR | Status: DC

## 2023-06-30 MED ORDER — ACETAMINOPHEN 650 MG RE SUPP: 325.0000 mg | RECTAL | Status: DC | PRN

## 2023-06-30 MED ORDER — DIPHENHYDRAMINE HCL 50 MG/ML IJ SOLN: 50.0000 mg | Freq: Once | INTRAMUSCULAR | Status: AC | PRN

## 2023-06-30 MED ORDER — MAGNESIUM SULFATE 2 GM/50ML IV SOLN: 2.0000 g | Freq: Every day | INTRAVENOUS | Status: DC | PRN

## 2023-06-30 MED ORDER — DEXAMETHASONE SODIUM PHOSPHATE 10 MG/ML IJ SOLN
INTRAMUSCULAR | Status: DC | PRN
  Administered 2023-06-30: 5 mg via INTRAVENOUS

## 2023-06-30 MED ORDER — FOLIC ACID 1 MG PO TABS
1.0000 mg | ORAL_TABLET | Freq: Every day | ORAL | Status: DC
  Administered 2023-07-01: 1 mg via ORAL
  Filled 2023-06-30: qty 1

## 2023-06-30 MED ORDER — HEPARIN SODIUM (PORCINE) 1000 UNIT/ML IJ SOLN
INTRAMUSCULAR | Status: DC | PRN
  Administered 2023-06-30: 5000 [IU] via INTRAVENOUS

## 2023-06-30 MED ORDER — SENNOSIDES-DOCUSATE SODIUM 8.6-50 MG PO TABS: 1.0000 | ORAL_TABLET | Freq: Every evening | ORAL | Status: DC | PRN

## 2023-06-30 MED ORDER — METHYLPREDNISOLONE SODIUM SUCC 125 MG IJ SOLR
INTRAMUSCULAR | Status: AC
  Administered 2023-06-30: 125 mg via INTRAVENOUS
  Filled 2023-06-30: qty 2

## 2023-06-30 MED ORDER — PROPOFOL 10 MG/ML IV BOLUS
INTRAVENOUS | Status: DC | PRN
  Administered 2023-06-30: 80 mg via INTRAVENOUS

## 2023-06-30 MED ORDER — GUAIFENESIN-DM 100-10 MG/5ML PO SYRP: 15.0000 mL | ORAL_SOLUTION | ORAL | Status: DC | PRN

## 2023-06-30 MED ORDER — DOCUSATE SODIUM 100 MG PO CAPS
100.0000 mg | ORAL_CAPSULE | Freq: Every day | ORAL | Status: DC
Start: 2023-07-01 — End: 2023-07-01
  Administered 2023-07-01: 100 mg via ORAL
  Filled 2023-06-30: qty 1

## 2023-06-30 MED ORDER — LOSARTAN POTASSIUM 50 MG PO TABS
50.0000 mg | ORAL_TABLET | Freq: Two times a day (BID) | ORAL | Status: DC
  Administered 2023-06-30 – 2023-07-01 (×2): 50 mg via ORAL
  Filled 2023-06-30 (×2): qty 1

## 2023-06-30 MED ORDER — LIDOCAINE HCL (CARDIAC) PF 100 MG/5ML IV SOSY
PREFILLED_SYRINGE | INTRAVENOUS | Status: DC | PRN
  Administered 2023-06-30: 60 mg via INTRAVENOUS

## 2023-06-30 MED ORDER — ATORVASTATIN CALCIUM 20 MG PO TABS
20.0000 mg | ORAL_TABLET | Freq: Every day | ORAL | Status: DC
  Administered 2023-06-30: 20 mg via ORAL
  Filled 2023-06-30 (×2): qty 1

## 2023-06-30 MED ORDER — DOPAMINE-DEXTROSE 3.2-5 MG/ML-% IV SOLN: 3.0000 ug/kg/min | INTRAVENOUS | Status: DC

## 2023-06-30 SURGICAL SUPPLY — 41 items
ADH SKN CLS APL DERMABOND .7 (GAUZE/BANDAGES/DRESSINGS) ×1
BALLN ATG 14X4X80 (BALLOONS) ×2
BALLOON ATG 14X4X80 (BALLOONS) IMPLANT
BENZOIN TINCTURE AMPULE (MISCELLANEOUS) IMPLANT
BRUSH SCRUB EZ 4% CHG (MISCELLANEOUS) IMPLANT
CATH ACCU-VU SIZ PIG 5F 70CM (CATHETERS) IMPLANT
CATH BALLN CODA 9X100X32 (BALLOONS) IMPLANT
CATH BEACON 5 .035 40 KMP TP (CATHETERS) IMPLANT
CATH BEACON 5 .035 65 KMP TIP (CATHETERS) IMPLANT
CLOSURE PERCLOSE PROSTYLE (VASCULAR PRODUCTS) IMPLANT
COVER DRAPE FLUORO 36X44 (DRAPES) IMPLANT
COVER PROBE ULTRASOUND 5X96 (MISCELLANEOUS) IMPLANT
DERMABOND ADVANCED .7 DNX12 (GAUZE/BANDAGES/DRESSINGS) IMPLANT
DEVICE SAFEGUARD 24CM (GAUZE/BANDAGES/DRESSINGS) IMPLANT
DEVICE TORQUE (MISCELLANEOUS) IMPLANT
ELECT REM PT RETURN 9FT ADLT (ELECTROSURGICAL) ×1
ELECTRODE REM PT RTRN 9FT ADLT (ELECTROSURGICAL) IMPLANT
EXCLUDER TNK LEG 23MX12X12 (Endovascular Graft) IMPLANT
EXCLUDER TRUNK LEG 23MX12X12 (Endovascular Graft) ×1 IMPLANT
GLIDEWIRE STIFF .35X180X3 HYDR (WIRE) IMPLANT
KIT ENCORE 26 ADVANTAGE (KITS) IMPLANT
NDL ENTRY 21GA 7CM ECHOTIP (NEEDLE) IMPLANT
NEEDLE ENTRY 21GA 7CM ECHOTIP (NEEDLE) ×1
PACK ANGIOGRAPHY (CUSTOM PROCEDURE TRAY) ×1 IMPLANT
PACK BASIN MAJOR ARMC (MISCELLANEOUS) IMPLANT
SET INTRO CAPELLA COAXIAL (SET/KITS/TRAYS/PACK) IMPLANT
SHEATH BRITE TIP 6FRX11 (SHEATH) IMPLANT
SHEATH BRITE TIP 8FRX11 (SHEATH) IMPLANT
SHEATH DRYSEAL FLEX 12FR 33CM (SHEATH) IMPLANT
SHEATH DRYSEAL FLEX 16FR 33CM (SHEATH) IMPLANT
SPONGE XRAY 4X4 16PLY STRL (MISCELLANEOUS) IMPLANT
STENT GRAFT CONTRALAT 16X12X10 (Vascular Products) IMPLANT
STENT LIFESTREAM 12X38X80 (Permanent Stent) IMPLANT
SUT MNCRL 4-0 (SUTURE) ×1
SUT MNCRL 4-0 27XMFL (SUTURE) ×1
SUTURE MNCRL 4-0 27XMF (SUTURE) IMPLANT
SYR MEDRAD MARK 7 150ML (SYRINGE) IMPLANT
TRAY FOLEY SLVR 16FR LF STAT (SET/KITS/TRAYS/PACK) IMPLANT
TUBING CONTRAST HIGH PRESS 72 (TUBING) IMPLANT
WIRE AMPLATZ SSTIFF .035X260CM (WIRE) IMPLANT
WIRE GUIDERIGHT .035X150 (WIRE) IMPLANT

## 2023-06-30 NOTE — Transfer of Care (Signed)
Immediate Anesthesia Transfer of Care Note  Patient: Jill Guzman  Procedure(s) Performed: ENDOVASCULAR REPAIR/STENT GRAFT  Patient Location: PACU  Anesthesia Type:General  Level of Consciousness: awake and alert   Airway & Oxygen Therapy: Patient Spontanous Breathing and Patient connected to face mask oxygen  Post-op Assessment: Report given to RN and Post -op Vital signs reviewed and stable  Post vital signs: Reviewed and stable  Last Vitals:  Vitals Value Taken Time  BP 142/64 06/30/23 1254  Temp    Pulse 82 06/30/23 1257  Resp 14 06/30/23 1257  SpO2 100 % 06/30/23 1257  Vitals shown include unfiled device data.  Last Pain:  Vitals:   06/30/23 1012  TempSrc: Oral  PainSc: 0-No pain         Complications: No notable events documented.

## 2023-06-30 NOTE — Anesthesia Preprocedure Evaluation (Signed)
Anesthesia Evaluation  Patient identified by MRN, date of birth, ID band Patient awake    Reviewed: Allergy & Precautions, NPO status , Patient's Chart, lab work & pertinent test results  History of Anesthesia Complications Negative for: history of anesthetic complications  Airway Mallampati: III  TM Distance: >3 FB Neck ROM: full    Dental  (+) Chipped   Pulmonary neg pulmonary ROS, former smoker   Pulmonary exam normal        Cardiovascular hypertension, Pt. on medications negative cardio ROS Normal cardiovascular exam     Neuro/Psych negative neurological ROS  negative psych ROS   GI/Hepatic negative GI ROS, Neg liver ROS,,,  Endo/Other  negative endocrine ROSdiabetes    Renal/GU      Musculoskeletal   Abdominal   Peds  Hematology negative hematology ROS (+)   Anesthesia Other Findings Past Medical History: 07/31/2020: AAA (abdominal aortic aneurysm) (HCC)     Comment:  a.) CTA AP 07/31/2020: 4.5 cm; b.) AAA duplex 11/05/20:               4.5; c.) AAA duplex 05/13/2021: 4.5 cm; d.) CTA AP               10/21/2021: 4.8 cm; e.) AAA duplex 01/27/2022: 4.5 cm;               f.) AAA duplex 05/01/2022: 4.5 cm; g.) AAA duplex               08/04/2022: 4.6 cm; h.) AAA duplex 02/19/2023: 5.0 cm;               i.) AAA duplex 06/04/2023: 5.6 cm No date: AB (asthmatic bronchitis) 07/09/2020: 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)     Comment:  a.) US aorta 07/09/2020: 4.2 cm No date: Anxiety No date: Asthma No date: Bell's palsy     Comment:  age 82 and age 82  No date: COPD (chronic obstructive pulmonary disease) (HCC) 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, right No date: Depression No date: Diabetes mellitus without complication (HCC) No date: Full dentures No date: Hearing aid worn (LEFT) No date: Hyperlipidemia due to type 2 diabetes mellitus (HCC) No date: Hypertension No date: 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) No date: 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) No date: Nonexudative age-related macular degeneration, bilateral,  early dry stage No date: On apixaban therapy No date: Osteopenia 08/13/2020: Shingles No date: Varicose veins of legs No date: Vertigo     Comment:  random, approx 1x/month  Past Surgical History: No date: ABDOMINAL HYSTERECTOMY 1976: BREAST EXCISIONAL BIOPSY; Left     Comment:  neg No date: CATARACT EXTRACTION W/ INTRAOCULAR LENS  IMPLANT, BILATERAL 2014: COLONOSCOPY     Comment:  normal No date: cyst on bladder removed No date: cyst removed breast; Left 10/20/2019: KNEE ARTHROSCOPY WITH MEDIAL MENISECTOMY; Left     Comment:  Procedure: KNEE ARTHROSCOPY WITH PARTIAL MEDIAL               MENISECTOMY;  Surgeon: Signa Kell, MD;  Location:               [facility];  Service: Orthopedics;  Laterality:               Left;  DIABETIC - oral meds No date: PARTIAL HYSTERECTOMY No date: TUBAL LIGATION  BMI    Body Mass Index: 21.40 kg/m      Reproductive/Obstetrics negative OB  ROS                              Anesthesia Physical Anesthesia Plan  ASA: 3  Anesthesia Plan: General ETT and General   Post-op Pain Management:    Induction: Intravenous  PONV Risk Score and Plan: 3 and Ondansetron, Dexamethasone and Midazolam  Airway Management Planned: Oral ETT  Additional Equipment:   Intra-op Plan:   Post-operative Plan: Extubation in OR  Informed Consent: I have reviewed the patients History and Physical, chart, labs and discussed the procedure including the risks, benefits and alternatives for the proposed anesthesia with the patient or authorized representative who has indicated his/her understanding and acceptance.     Dental Advisory Given  Plan Discussed with: Anesthesiologist, CRNA and Surgeon  Anesthesia Plan Comments: (Patient consented for risks of  anesthesia including but not limited to:  - adverse reactions to medications - damage to eyes, teeth, lips or other oral mucosa - nerve damage due to positioning  - sore throat or hoarseness - Damage to heart, brain, nerves, lungs, other parts of body or loss of life  Patient voiced understanding and assent.)         Anesthesia Quick Evaluation

## 2023-06-30 NOTE — Interval H&P Note (Signed)
History and Physical Interval Note:  06/30/2023 10:06 AM  Jill Guzman  has presented today for surgery, with the diagnosis of AAA   GORE    AAA repair.  The various methods of treatment have been discussed with the patient and family. After consideration of risks, benefits and other options for treatment, the patient has consented to  Procedure(s): ENDOVASCULAR REPAIR/STENT GRAFT (N/A) as a surgical intervention.  The patient's history has been reviewed, patient examined, no change in status, stable for surgery.  I have reviewed the patient's chart and labs.  Questions were answered to the patient's satisfaction.     Festus Barren

## 2023-06-30 NOTE — Op Note (Signed)
OPERATIVE NOTE   PROCEDURE: US guidance for vascular access, bilateral femoral arteries Catheter placement into aorta from bilateral femoral approaches Placement of a C3 23 x 12 x 12 Gore Excluder Endoprosthesis main body with a 12 x 10 contralateral limb Placement of a 12 mm x 38 mm Lifestream stent postdilated to 14 mm in the contralateral gate to treat persistent narrowing of the limb secondary to the aneurysm morphology.   ProGlide closure devices bilateral femoral arteries  PRE-OPERATIVE DIAGNOSIS: AAA  POST-OPERATIVE DIAGNOSIS: same  SURGEON: Levora Dredge, MD and Festus Barren, MD - Co-surgeons  ANESTHESIA: general  ESTIMATED BLOOD LOSS: 50 cc  FINDING(S): 1.  AAA  SPECIMEN(S):  none  INDICATIONS:   Jill Guzman is a 82 y.o. y.o. female who presents with presents with an abdominal aortic aneurysm that is greater than 5.7 cm placing her at risk for lethal rupture.  The anatomy is suitable for endovascular repair.  Risks and benefits for repair of the abdominal aortic aneurysm using an endograft was described in detail to the patient all questions have been answered patient agrees to proceed.  Co-surgeons are utilized to expedite the procedure and reduce operative time improving patient's safety and improving outcome.  DESCRIPTION: After obtaining full informed written consent, the patient was brought back to the operating room and placed supine upon the operating table.  The patient received IV antibiotics prior to induction.  After obtaining adequate anesthesia, the patient was prepped and draped in the standard fashion for endovascular AAA repair.  Co-surgeons are required because this is a complex bilateral procedure with work being performed simultaneously from both the right femoral and left femoral approach.  This also expedites the procedure making a shorter operative time reducing complications and improving patient safety.  We then began by gaining access to both  femoral arteries with US guidance with me working on the patient's left and Dr. Wyn Quaker working on the patient's right.  The femoral arteries were found to be patent and accessed without difficulty with a needle under ultrasound guidance without difficulty on each side and permanent images were recorded.  We then placed 2 proglide devices on each side in a pre-close fashion and placed 8 French sheaths.  The patient was then given 5000 units of intravenous heparin.   The Pigtail catheter was placed into the aorta from the left side. Using this image, we selected a C3 23 x 12 x 12 Main body device.  Over a stiff wire, an 16 French sheath was placed. The main body was then placed through the 16 French sheath.  The pigtail catheter was used to create magnified image at the renal arteries.  An Amplatz stiff wire was then advanced through the pigtail catheter and the 8 French sheath on the left was exchanged for a 12 [place] sheath.  The main body was then deployed just below the lowest renal artery.  We did have to reconstrained and adjust the main body once and subsequently obtained a follow-up image.  The pigtail catheter wire were then pulled back into the distal aneurysm sac and the pigtail exchanged for a Kumpe catheter.  The Kumpe catheter was used to cannulate the contralateral gate without difficulty and successful cannulation was confirmed by twirling the pigtail catheter in the main body.  A retrograde arteriogram was performed through the left femoral sheath.  A 12 x 10 contralateral iliac limb was selected and deployed. The main body deployment was then completed. Based off the angiographic findings, extension  limbs were not necessary.  However, the contralateral junction within the main body appeared to be persistently restrained from extrinsic compression.  Based on this we elected to place a 12 mm x 38 mm Lifestream stent.  This was advanced up from the left side while a 14 mm x 40 mm Atlas balloon was  advanced up from the right side in the kissing balloon fashion.  Simultaneous inflation was performed.  Subsequently the 12 mm balloon was removed and a 14 mm x 40 mm Atlas balloon was advanced up the left side and both 14 mm balloons were were again inflated at the same time.  Inflations were to 8 to 10 atm. All junction points and seals zones were treated with the compliant balloon.   The pigtail catheter was then replaced and a completion angiogram was performed.   No endoleak was detected on completion angiography. The renal arteries were found to be widely patent.    At this point we elected to terminate the procedure. We secured the pro glide devices for hemostasis on the femoral arteries. The skin incision was closed with a 4-0 Monocryl. Dermabond and pressure dressing were placed. The patient was taken to the recovery room in stable condition having tolerated the procedure well.  COMPLICATIONS: none  CONDITION: stable  Levora Dredge  06/30/2023, 1:43 PM

## 2023-06-30 NOTE — Anesthesia Procedure Notes (Signed)
Procedure Name: Intubation Date/Time: 06/30/2023 10:59 AM  Performed by: Berniece Pap, CRNAPre-anesthesia Checklist: Patient identified, Emergency Drugs available, Suction available and Patient being monitored Patient Re-evaluated:Patient Re-evaluated prior to induction Oxygen Delivery Method: Circle system utilized Preoxygenation: Pre-oxygenation with 100% oxygen Induction Type: IV induction Ventilation: Mask ventilation without difficulty Laryngoscope Size: McGraph and 3 Grade View: Grade I Tube type: Oral Tube size: 7.0 mm Number of attempts: 1 Airway Equipment and Method: Stylet and Oral airway Placement Confirmation: ETT inserted through vocal cords under direct vision, positive ETCO2 and breath sounds checked- equal and bilateral Secured at: 21 cm Tube secured with: Tape Dental Injury: Teeth and Oropharynx as per pre-operative assessment

## 2023-06-30 NOTE — Plan of Care (Signed)
Patient orientation remained stable through day.  Did not know the day of week but knows month.  Follows commands and feeding self.  Mother at bedside and said he is much more himself now.  Monitoring Na still Q4.

## 2023-06-30 NOTE — Op Note (Signed)
OPERATIVE NOTE   PROCEDURE: US guidance for vascular access, bilateral femoral arteries Catheter placement into aorta from bilateral femoral approaches Placement of a 23 mm diameter 12 cm length C3 Gore Excluder Endoprosthesis main body right with a 12 mm diameter by 10 cm length left iliac contralateral limb Placement of a 12 mm diameter by 38 mm length Lifestream stent postdilated to 14 mm in the contralateral gate to the contralateral limb junction area for constraint after initial stent ointment ProGlide closure devices bilateral femoral arteries  PRE-OPERATIVE DIAGNOSIS: AAA  POST-OPERATIVE DIAGNOSIS: same  SURGEON: Festus Barren, MD and Levora Dredge, MD - Co-surgeons  ANESTHESIA: General  ESTIMATED BLOOD LOSS: 50 cc  FINDING(S): 1.  AAA  SPECIMEN(S):  none  INDICATIONS:   Jill Guzman is a 82 y.o. female who presents with a roughly 5.7 cm infrarenal abdominal aortic aneurysm.  This has had relatively rapid growth over the past year.  Although she has ongoing treatment for malignancy, her oncologist feels aneurysm repair would be indicated as she has done quite well with treatment and her expected survivability could be several years. The anatomy was suitable for endovascular repair.  Risks and benefits of repair in an endovascular fashion were discussed and informed consent was obtained. Co-surgeons are used to expedite the procedure and reduce operative time as bilateral work needs to be done.  DESCRIPTION: After obtaining full informed written consent, the patient was brought back to the operating room and placed supine upon the operating table.  The patient received IV antibiotics prior to induction.  After obtaining adequate anesthesia, the patient was prepped and draped in the standard fashion for endovascular AAA repair.  We then began by gaining access to both femoral arteries with US guidance with me working on the right and Dr. Gilda Crease working on the left.  The  femoral arteries were found to be patent and accessed without difficulty with a needle under ultrasound guidance without difficulty on each side and permanent images were recorded.  We then placed 2 proglide devices on each side in a pre-close fashion and placed 8 French sheaths. The patient was then given 5000 units of intravenous heparin. The Pigtail catheter was placed into the aorta from the left side. Using this image, we selected a 23 mm diameter by 12 cm length Gore excluder C3 Main body device.  Over a stiff wire, an 16 French sheath was placed on the right side. The main body was then placed through the 16 French sheath. A Kumpe catheter was placed up the left side and a magnified image at the renal arteries was performed. The main body was then deployed just below the lowest renal artery was the left renal artery. The Kumpe catheter was used to cannulate the contralateral gate without difficulty and successful cannulation was confirmed by twirling the pigtail catheter in the main body. We then placed a stiff wire and a retrograde arteriogram was performed through the left femoral sheath. We upsized to the 12 [place] sheath on the left for the contralateral limb and a 12 mm diameter by 10 cm length left iliac limb was selected and deployed. The main body deployment was then completed. Based off the angiographic findings, extension limbs were not necessary.  There was however constraint at the contralateral gate and contralateral limb junction area due to the tight area in the distal aorta. All junction points and seals zones were treated with the compliant balloon. The pigtail catheter was then replaced and a completion  angiogram was performed.  No apparent endoleak was detected on completion angiography. The renal arteries were found to be widely patent.  Both hypogastric arteries remain patent.  However, there was still constraint at the contralateral gate and contralateral limb junction area and we  elected to place a balloon expandable stent in this area.  A 12 mm diameter by 38 mm length Lifestream stent was then deployed and then postdilated with a 14 mm balloon while a 14 mm balloon was placed up the right limb to avoid any narrowing on that side.  Completion imaging showed excellent flow with no residual constraint through both limbs of the stent graft at this point. At this point we elected to terminate the procedure. We secured the pro glide devices for hemostasis on the femoral arteries. The skin incision was closed with a 4-0 Monocryl. Dermabond and pressure dressing were placed. The patient was taken to the recovery room in stable condition having tolerated the procedure well.  COMPLICATIONS: none  CONDITION: stable  Festus Barren  06/30/2023, 12:46 PM   This note was created with Dragon Medical transcription system. Any errors in dictation are purely unintentional.

## 2023-06-30 NOTE — Anesthesia Postprocedure Evaluation (Signed)
Anesthesia Post Note  Patient: Jill Guzman  Procedure(s) Performed: ENDOVASCULAR REPAIR/STENT GRAFT  Patient location during evaluation: PACU Anesthesia Type: General Level of consciousness: awake and alert Pain management: pain level controlled Vital Signs Assessment: post-procedure vital signs reviewed and stable Respiratory status: spontaneous breathing, nonlabored ventilation, respiratory function stable and patient connected to nasal cannula oxygen Cardiovascular status: blood pressure returned to baseline and stable Postop Assessment: no apparent nausea or vomiting Anesthetic complications: no  No notable events documented.   Last Vitals:  Vitals:   06/30/23 1012  BP: (!) 156/73  Pulse: 75  Resp: 20  SpO2: 96%    Last Pain:  Vitals:   06/30/23 1012  TempSrc: Oral  PainSc: 0-No pain                 Stephanie Coup

## 2023-07-01 LAB — CBC
HCT: 30.6 % — ABNORMAL LOW (ref 36.0–46.0)
Hemoglobin: 10 g/dL — ABNORMAL LOW (ref 12.0–15.0)
MCH: 27.5 pg (ref 26.0–34.0)
MCHC: 32.7 g/dL (ref 30.0–36.0)
MCV: 84.3 fL (ref 80.0–100.0)
Platelets: 179 10*3/uL (ref 150–400)
RBC: 3.63 MIL/uL — ABNORMAL LOW (ref 3.87–5.11)
RDW: 18.1 % — ABNORMAL HIGH (ref 11.5–15.5)
WBC: 9.2 10*3/uL (ref 4.0–10.5)
nRBC: 0 % (ref 0.0–0.2)

## 2023-07-01 LAB — BASIC METABOLIC PANEL
Anion gap: 9 (ref 5–15)
BUN: 14 mg/dL (ref 8–23)
CO2: 22 mmol/L (ref 22–32)
Calcium: 8.6 mg/dL — ABNORMAL LOW (ref 8.9–10.3)
Chloride: 105 mmol/L (ref 98–111)
Creatinine, Ser: 0.63 mg/dL (ref 0.44–1.00)
GFR, Estimated: >60 mL/min (ref >60)
Glucose, Bld: 229 mg/dL — ABNORMAL HIGH (ref 70–99)
Potassium: 4.3 mmol/L (ref 3.5–5.1)
Sodium: 136 mmol/L (ref 135–145)

## 2023-07-01 LAB — GLUCOSE, CAPILLARY
Glucose-Capillary: 141 mg/dL — ABNORMAL HIGH (ref 70–99)
Glucose-Capillary: 254 mg/dL — ABNORMAL HIGH (ref 70–99)

## 2023-07-01 MED ORDER — HYDROCODONE-ACETAMINOPHEN 5-325 MG PO TABS: 2.0000 | ORAL_TABLET | Freq: Four times a day (QID) | ORAL | 0 refills | Status: AC | PRN

## 2023-07-01 MED ORDER — HEPARIN SOD (PORK) LOCK FLUSH 100 UNIT/ML IV SOLN
500.0000 [IU] | INTRAVENOUS | Status: AC | PRN
  Administered 2023-07-01: 500 [IU]

## 2023-07-01 NOTE — Plan of Care (Signed)
  Problem: Education: Goal: Knowledge of discharge needs will improve Outcome: Progressing   Problem: Clinical Measurements: Goal: Postoperative complications will be avoided or minimized Outcome: Progressing   Problem: Respiratory: Goal: Ability to achieve and maintain a regular respiratory rate will improve Outcome: Progressing   Problem: Skin Integrity: Goal: Demonstration of wound healing without infection will improve Outcome: Progressing   Problem: Education: Goal: Knowledge of General Education information will improve Description: Including pain rating scale, medication(s)/side effects and non-pharmacologic comfort measures Outcome: Progressing   Problem: Health Behavior/Discharge Planning: Goal: Ability to manage health-related needs will improve Outcome: Progressing   Problem: Clinical Measurements: Goal: Ability to maintain clinical measurements within normal limits will improve Outcome: Progressing Goal: Will remain free from infection Outcome: Progressing Goal: Diagnostic test results will improve Outcome: Progressing Goal: Respiratory complications will improve Outcome: Progressing Goal: Cardiovascular complication will be avoided Outcome: Progressing   Problem: Activity: Goal: Risk for activity intolerance will decrease Outcome: Progressing   Problem: Nutrition: Goal: Adequate nutrition will be maintained Outcome: Progressing   Problem: Coping: Goal: Level of anxiety will decrease Outcome: Progressing   Problem: Elimination: Goal: Will not experience complications related to bowel motility Outcome: Progressing Goal: Will not experience complications related to urinary retention Outcome: Progressing   Problem: Pain Management: Goal: General experience of comfort will improve Outcome: Progressing   Problem: Safety: Goal: Ability to remain free from injury will improve Outcome: Progressing   Problem: Skin Integrity: Goal: Risk for impaired  skin integrity will decrease Outcome: Progressing   Problem: Education: Goal: Ability to describe self-care measures that may prevent or decrease complications (Diabetes Survival Skills Education) will improve Outcome: Progressing   Problem: Fluid Volume: Goal: Ability to maintain a balanced intake and output will improve Outcome: Progressing   Problem: Health Behavior/Discharge Planning: Goal: Ability to manage health-related needs will improve Outcome: Progressing   Problem: Metabolic: Goal: Ability to maintain appropriate glucose levels will improve Outcome: Progressing

## 2023-07-01 NOTE — Progress Notes (Signed)
Patient A/O x4. Bilateral groin sites clean/dry/intact. No complaints of pain. Patient voided post foley removal x3. Patient ambulated unit 1.5 times. Tolerated well. Patient refused BP medication, MD made aware. Discharge instruction and medication reviewed with patient and family. Satisfied all belongings were returned.

## 2023-07-01 NOTE — Discharge Summary (Signed)
[facility]    Discharge Summary    Patient ID:  Jill Guzman MRN: [id number] DOB/AGE: 03-26-1941 82 y.o.  Admit date: 06/30/2023 Discharge date: 07/01/2023 Date of Surgery: 06/30/2023 Surgeon: Surgeon(s): Taiwo Fish, Marlow Baars, MD Schnier, Latina Craver, MD  Admission Diagnosis: AAA (abdominal aortic aneurysm) without rupture [facility]) [I71.40]  Discharge Diagnoses:  AAA (abdominal aortic aneurysm) without rupture (HCC) [I71.40]  Secondary Diagnoses: 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   a.) US aorta 07/09/2020: 4.2 cm   Anxiety    Asthma    Bell's palsy    age 59 and age 79    COPD (chronic obstructive pulmonary disease) (HCC)    Deaf, right    Depression    Diabetes mellitus without complication (HCC)    Full dentures    Hearing aid worn (LEFT)    Hyperlipidemia due to type 2 diabetes mellitus (HCC)    Hypertension    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 female is S/P  Neither Procedure(s): ENDOVASCULAR REPAIR/STENT GRAFT Extubated: POD # 0 Physical exam: Feet are warm, access site is without hematoma, vital signs are stable Post-op wounds clean, dry, intact or healing well Pt. Ambulating, voiding and taking PO diet without difficulty. Pt pain controlled with PO pain meds. Labs as below Complications:none  Consults:    Significant Diagnostic Studies: CBC Lab Results  Component Value Date   WBC 9.2 07/01/2023   HGB 10.0 (L) 07/01/2023   HCT 30.6 (L) 07/01/2023   MCV 84.3 07/01/2023   PLT 179 07/01/2023    BMET    Component Value Date/Time   NA 136 07/01/2023 0313   NA 140 06/24/2022 0956   K 4.3 07/01/2023 0313   CL 105 07/01/2023 0313   CO2 22 07/01/2023 0313   GLUCOSE 229 (H) 07/01/2023 0313   BUN 14 07/01/2023 0313   BUN 12 06/24/2022 0956   CREATININE 0.63 07/01/2023 0313   CALCIUM 8.6 (L) 07/01/2023 0313   GFRNONAA >60 07/01/2023 0313   GFRAA 94 11/15/2019 1141   COAG Lab Results  Component Value Date   INR 1.2 06/30/2023     Disposition:  Discharge to :Home Discharge Instructions     Call MD for:  redness, tenderness, or signs of infection (pain, swelling, bleeding, redness, odor or green/yellow discharge around incision site)   Complete by: As directed    Call MD for:  severe or increased pain, loss or decreased feeling  in affected  limb(s)   Complete by: As directed    Call MD for:  temperature >100.5   Complete by: As directed    Driving Restrictions   Complete by: As directed    No driving for 3 days   Lifting restrictions   Complete by: As directed    No lifting for 3 days   No dressing needed   Complete by: As directed    Replace only if drainage present   Resume previous diet   Complete by: As directed       Allergies as of 07/01/2023       Reactions   Cefepime Rash   Tolerated 3rd generation cephalosporin (CEFTRIAXONE) 08/18/2022 with no documented ADRs.    Penicillins Itching   Tolerated  3rd generation cephalosporin (CEFTRIAXONE) 08/18/2022 with no documented ADRs.    Sulfa Antibiotics Itching   Iodinated Contrast Media Itching   Redness and itchiness   Vancomycin Rash        Medication List     STOP taking these medications    cetirizine 10 MG tablet Commonly known as: ZYRTEC   dexamethasone 4 MG tablet Commonly known as: DECADRON   HumaLOG KwikPen 100 UNIT/ML KwikPen Generic drug: insulin lispro   ipratropium-albuterol 0.5-2.5 (3) MG/3ML Soln Commonly known as: DUONEB   Lantus SoloStar 100 UNIT/ML Solostar Pen Generic drug: insulin glargine   meloxicam 7.5 MG tablet Commonly known as: MOBIC   OLANZapine 5 MG tablet Commonly known as: ZYPREXA   ondansetron 8 MG tablet Commonly known as: ZOFRAN   oxyCODONE 5 MG immediate release tablet Commonly known as: Oxy IR/ROXICODONE   predniSONE 50 MG tablet Commonly known as: DELTASONE   prochlorperazine 10 MG tablet Commonly known as: COMPAZINE       TAKE these medications    Accu-Chek Guide Me w/Device Kit Test once daily   Accu-Chek Guide test strip Generic drug: glucose blood USE 1 STRIP TO CHECK GLUCOSE FOR BLOOD SUGAR THREE TIMES DAILY AND FOR SYMPTOMS OF HIGH OR LOW BLOOD SUGAR   acetaminophen 500 MG tablet Commonly known as: TYLENOL Take by mouth.   albuterol 108 (90 Base) MCG/ACT inhaler Commonly known as: VENTOLIN HFA INHALE 1 PUFF BY MOUTH 4 TIMES DAILY   amLODipine 5 MG tablet Commonly known as: NORVASC Take 1 tablet (5 mg total) by mouth daily.   apixaban 5 MG Tabs tablet Commonly known as: ELIQUIS Take 5 mg by mouth 2 (two) times daily.   atorvastatin 20 MG tablet Commonly known as: LIPITOR Take 1 tablet (20 mg total) by mouth daily.   ezetimibe 10 MG tablet Commonly known as: ZETIA Take 1 tablet (10 mg total) by mouth daily.   famotidine 20 MG tablet Commonly known as: PEPCID Take by mouth.   fluticasone 50 MCG/ACT nasal spray Commonly known as:  FLONASE Place 2 sprays into both nostrils daily.   folic acid 1 MG tablet Commonly known as: FOLVITE Take 1 mg by mouth daily.   HYDROcodone-acetaminophen 5-325 MG tablet Commonly known as: NORCO/VICODIN Take 2 tablets by mouth every 6 (six) hours as needed for moderate pain (pain score 4-6).   lidocaine-prilocaine cream Commonly known as: EMLA Apply to small amount to port site and cover with occlusive dressing 1 hr prior to access.   losartan 50 MG tablet Commonly known as: COZAAR Take 1 tablet (50 mg total) by mouth 2 (two) times daily.   metFORMIN 500 MG tablet Commonly known as: GLUCOPHAGE Take 1 tablet (500 mg total) by mouth 2 (  two) times daily. What changed:  when to take this additional instructions   montelukast 10 MG tablet Commonly known as: SINGULAIR TAKE 1 TABLET BY MOUTH AT BEDTIME   ReliOn Pen Needles 32G X 4 MM Misc Generic drug: Insulin Pen Needle   sertraline 50 MG tablet Commonly known as: ZOLOFT Take 1 tablet by mouth once daily   Symbicort 160-4.5 MCG/ACT inhaler Generic drug: budesonide-formoterol Inhale 2 puffs into the lungs 2 (two) times daily. What changed: Another medication with the same name was removed. Continue taking this medication, and follow the directions you see here.   Vitamin D 50 MCG (2000 UT) Caps Take by mouth.       Verbal and written Discharge instructions given to the patient. Wound care per Discharge AVS  Follow-up Information     Georgiana Spinner, NP Follow up in 4 week(s).   Specialty: Vascular Surgery Why: with EVAR duplex Contact information: [address] [place] [address] [phone]                 Signed: Festus Barren, MD  07/01/2023, 11:51 AM

## 2023-07-01 NOTE — Progress Notes (Signed)
[facility]  Daily Progress Note   Subjective  -   No major events overnight.  Has not been up and walking yet.  Foley catheter still in place.  Access sites are clean and dry without hematoma.  Objective Vitals:   07/01/23 0300 07/01/23 0400 07/01/23 0409 07/01/23 0500  BP: (!) 108/59 (!) 112/57  (!) 107/53  Pulse: 72 65 68 64  Resp: 13 13 13  (!) 21  Temp:  98 F (36.7 C)    TempSrc:  Axillary    SpO2: 97% 99% 99% 99%  Weight:      Height:        Intake/Output Summary (Last 24 hours) at 07/01/2023 0746 Last data filed at 07/01/2023 0715 Gross per 24 hour  Intake 1257.83 ml  Output 2605 ml  Net -1347.17 ml    PULM  CTAB CV  RRR VASC  access site clean and dry without hematoma.  Feet are warm.  Laboratory CBC    Component Value Date/Time   WBC 9.2 07/01/2023 0313   HGB 10.0 (L) 07/01/2023 0313   HGB 14.1 03/29/2019 1031   HCT 30.6 (L) 07/01/2023 0313   HCT 42.9 03/29/2019 1031   PLT 179 07/01/2023 0313   PLT 225 03/29/2019 1031    BMET    Component Value Date/Time   NA 136 07/01/2023 0313   NA 140 06/24/2022 0956   K 4.3 07/01/2023 0313   CL 105 07/01/2023 0313   CO2 22 07/01/2023 0313   GLUCOSE 229 (H) 07/01/2023 0313   BUN 14 07/01/2023 0313   BUN 12 06/24/2022 0956   CREATININE 0.63 07/01/2023 0313   CALCIUM 8.6 (L) 07/01/2023 0313   GFRNONAA >60 07/01/2023 0313   GFRAA 94 11/15/2019 1141    Assessment/Planning: POD #1 s/p AAA repair  Remove Foley and ambulate this morning Tolerating diet so far Likely can go home later today but given her somewhat debilitated state if she is unable to ambulate well and feels weak, another day in the hospital may be of benefit.   Festus Barren  07/01/2023, 7:46 AM

## 2023-07-01 NOTE — TOC CM/SW Note (Signed)
Transition of Care [facility]) - Inpatient Brief Assessment   Patient Details  Name: Jill Guzman MRN: [id number] Date of Birth: [birth year]  [facility]) CM/SW Contact:    Margarito Liner, LCSW Phone Number: 07/01/2023, 1:31 PM   Clinical Narrative: Patient has orders to discharge home today. Chart reviewed. No TOC needs identified. Per RN, patient has been weaned to room air and has oxygen at home to use as needed. CSW signing off.  Transition of Care Asessment: Insurance and Status: Insurance coverage has been reviewed Patient has primary care physician: Yes Home environment has been reviewed: Single family home Prior level of function:: Not documented Prior/Current Home Services: No current home services Social Determinants of Health Reivew: SDOH reviewed no interventions necessary Readmission risk has been reviewed: Yes Transition of care needs: no transition of care needs at this time

## 2023-07-02 ENCOUNTER — Emergency Department: Payer: Medicare Other

## 2023-07-02 ENCOUNTER — Other Ambulatory Visit: Payer: Self-pay

## 2023-07-02 ENCOUNTER — Emergency Department
Admission: EM | Admit: 2023-07-02 | Discharge: 2023-07-02 | Disposition: A | Discharge status: Home / Self Care | Payer: Medicare Other | Attending: Emergency Medicine | Admitting: Emergency Medicine

## 2023-07-02 DIAGNOSIS — R509 Fever, unspecified: Secondary | ICD-10-CM | POA: Diagnosis not present

## 2023-07-02 DIAGNOSIS — I517 Cardiomegaly: Secondary | ICD-10-CM | POA: Diagnosis not present

## 2023-07-02 DIAGNOSIS — I10 Essential (primary) hypertension: Secondary | ICD-10-CM | POA: Insufficient documentation

## 2023-07-02 DIAGNOSIS — E119 Type 2 diabetes mellitus without complications: Secondary | ICD-10-CM | POA: Insufficient documentation

## 2023-07-02 DIAGNOSIS — I7143 Infrarenal abdominal aortic aneurysm, without rupture: Secondary | ICD-10-CM | POA: Diagnosis not present

## 2023-07-02 DIAGNOSIS — R918 Other nonspecific abnormal finding of lung field: Secondary | ICD-10-CM | POA: Diagnosis not present

## 2023-07-02 DIAGNOSIS — I719 Aortic aneurysm of unspecified site, without rupture: Secondary | ICD-10-CM | POA: Diagnosis not present

## 2023-07-02 DIAGNOSIS — R5082 Postprocedural fever: Secondary | ICD-10-CM | POA: Diagnosis not present

## 2023-07-02 LAB — URINALYSIS, W/ REFLEX TO CULTURE (INFECTION SUSPECTED)
Bacteria, UA: NONE SEEN
Bilirubin Urine: NEGATIVE
Glucose, UA: NEGATIVE mg/dL
Hgb urine dipstick: NEGATIVE
Ketones, ur: 5 mg/dL — AB
Leukocytes,Ua: NEGATIVE
Nitrite: NEGATIVE
Protein, ur: NEGATIVE mg/dL
Specific Gravity, Urine: 1.015 (ref 1.005–1.030)
pH: 7 (ref 5.0–8.0)

## 2023-07-02 LAB — COMPREHENSIVE METABOLIC PANEL
ALT: 17 U/L (ref 0–44)
AST: 14 U/L — ABNORMAL LOW (ref 15–41)
Albumin: 4.2 g/dL (ref 3.5–5.0)
Alkaline Phosphatase: 45 U/L (ref 38–126)
Anion gap: 10 (ref 5–15)
BUN: 12 mg/dL (ref 8–23)
CO2: 25 mmol/L (ref 22–32)
Calcium: 9.6 mg/dL (ref 8.9–10.3)
Chloride: 97 mmol/L — ABNORMAL LOW (ref 98–111)
Creatinine, Ser: 0.63 mg/dL (ref 0.44–1.00)
GFR, Estimated: >60 mL/min (ref >60)
Glucose, Bld: 151 mg/dL — ABNORMAL HIGH (ref 70–99)
Potassium: 3.7 mmol/L (ref 3.5–5.1)
Sodium: 132 mmol/L — ABNORMAL LOW (ref 135–145)
Total Bilirubin: 1.2 mg/dL (ref 0.3–1.2)
Total Protein: 7.1 g/dL (ref 6.5–8.1)

## 2023-07-02 LAB — CBC WITH DIFFERENTIAL/PLATELET
Abs Immature Granulocytes: 0.09 10*3/uL — ABNORMAL HIGH (ref 0.00–0.07)
Basophils Absolute: 0 10*3/uL (ref 0.0–0.1)
Basophils Relative: 0 %
Eosinophils Absolute: 0.1 10*3/uL (ref 0.0–0.5)
Eosinophils Relative: 1 %
HCT: 33.2 % — ABNORMAL LOW (ref 36.0–46.0)
Hemoglobin: 10.7 g/dL — ABNORMAL LOW (ref 12.0–15.0)
Immature Granulocytes: 1 %
Lymphocytes Relative: 13 %
Lymphs Abs: 1.5 10*3/uL (ref 0.7–4.0)
MCH: 27.5 pg (ref 26.0–34.0)
MCHC: 32.2 g/dL (ref 30.0–36.0)
MCV: 85.3 fL (ref 80.0–100.0)
Monocytes Absolute: 2.1 10*3/uL — ABNORMAL HIGH (ref 0.1–1.0)
Monocytes Relative: 18 %
Neutro Abs: 8.1 10*3/uL — ABNORMAL HIGH (ref 1.7–7.7)
Neutrophils Relative %: 67 %
Platelets: 252 10*3/uL (ref 150–400)
RBC: 3.89 MIL/uL (ref 3.87–5.11)
RDW: 18.6 % — ABNORMAL HIGH (ref 11.5–15.5)
WBC: 12 10*3/uL — ABNORMAL HIGH (ref 4.0–10.5)
nRBC: 0 % (ref 0.0–0.2)

## 2023-07-02 LAB — LACTIC ACID, PLASMA: Lactic Acid, Venous: 0.9 mmol/L (ref 0.5–1.9)

## 2023-07-02 NOTE — Discharge Instructions (Signed)
You were seen in the emergency department today for evaluation of your fever.  Your testing was overall reassuring.  We will call you if your blood cultures return positive.  Please return to the ER immediately for any new or worsening symptoms.  Otherwise, follow-up with your primary care doctor and vascular surgeon for further evaluation.  Use your incentive spirometer a few times a day to help prevent pneumonia.

## 2023-07-02 NOTE — ED Triage Notes (Addendum)
Pt arrives with family to the ED 2 days post op from an aortic aneurysm surgery. Family noticed that pt started to get a fever this morning and it has progressed throughout the day. Pt is also actively getting chemotherapy for lung cancer.

## 2023-07-02 NOTE — ED Provider Notes (Signed)
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  Patient reports she is overall been doing well since discharge yesterday.  Specifically denies headache, chest pain, shortness of breath, abdominal pain, nausea, vomiting, diarrhea, dysuria.  However, felt warm at home today in the morning that progressed throughout the day.  Daughter attempted to contact vascular surgery office unsuccessfully, so presented to the ER for further evaluation.     Physical Exam   Triage Vital Signs: ED Triage Vitals  Encounter Vitals Group     BP 07/02/23 1731 128/63     Systolic BP Percentile --      Diastolic BP Percentile --      Pulse Rate 07/02/23 1731 79     Resp 07/02/23 1731 16     Temp 07/02/23 1731 (!) 101.4 F (38.6 C)     Temp Source 07/02/23 1731 Oral     SpO2 07/02/23 1731 100 %     Weight 07/02/23 1733 124 lb 9 oz (56.5 kg)     Height 07/02/23 1733 5\' 2"  (1.575 m)     Head Circumference --      Peak Flow --      Pain Score 07/02/23 1733 0     Pain Loc --      Pain Education --      Exclude from Growth Chart --     Most recent vital signs: Vitals:   07/02/23 1940 07/02/23 1942  BP: (!) 125/99   Pulse: 72   Resp: 16   Temp:  100 F (37.8 C)  SpO2: 99%      General: Awake, interactive  CV:  Regular rate, good peripheral perfusion.  Resp:  Lungs clear to auscultation, respirations unlabored Abd:  Soft, nontender, nondistended, puncture wounds in the bilateral femoral region with overlying dressing.  When these are taken down there is an area of clotted blood closed with glue, no surrounding erythema,  drainage.  No significant tenderness or swelling over this area. Neuro:  Symmetric facial movement, fluid speech   ED Results / Procedures / Treatments   Labs (all labs ordered are listed, but only abnormal results are displayed) Labs Reviewed  COMPREHENSIVE METABOLIC PANEL - Abnormal; Notable for the following components:      Result Value   Sodium 132 (*)    Chloride 97 (*)    Glucose, Bld 151 (*)    AST 14 (*)    All other components within normal limits  CBC WITH DIFFERENTIAL/PLATELET - Abnormal; Notable for the following components:   WBC 12.0 (*)    Hemoglobin 10.7 (*)    HCT 33.2 (*)    RDW 18.6 (*)    Neutro Abs 8.1 (*)    Monocytes Absolute 2.1 (*)    Abs Immature Granulocytes 0.09 (*)    All other components within normal limits  URINALYSIS, W/ REFLEX TO CULTURE (INFECTION SUSPECTED) - Abnormal; Notable for the following components:   Color, Urine YELLOW (*)    APPearance CLEAR (*)    Ketones, ur 5 (*)    All other components within normal limits  CULTURE, BLOOD (SINGLE) W REFLEX TO ID PANEL  CULTURE, BLOOD (SINGLE)  LACTIC ACID, PLASMA  LACTIC ACID, PLASMA     EKG EKG independently reviewed interpreted by myself (ER attending) demonstrates:    RADIOLOGY Imaging independently reviewed and interpreted by myself demonstrates:  Chest x-Lycan Davee with left apical abnormal area likely reflective of her malignancy as well as possible atelectasis versus small infiltrate in the left lung, better visualized on the Noncontrast CT abdomen pelvis demonstrates stent placement, radiology notes small volume gas likely related to recent procedure  PROCEDURES:  Critical Care performed: No  Procedures   MEDICATIONS ORDERED IN ED: Medications - No data to display   IMPRESSION / MDM / ASSESSMENT AND PLAN / ED COURSE  I reviewed the triage vital signs and the nursing notes.  Differential diagnosis includes, but is not limited to, noninfectious postoperative fever including  atelectasis, reactive related to recent surgery, infectious causes including pneumonia, UTI, postop infection  Patient's presentation is most consistent with acute presentation with potential threat to life or bodily function.  82 year old female with multiple comorbidities presenting with a fever 2 days after AAA repair.  Febrile, but otherwise stable vitals on presentation.  Labs with mild leukocytosis, stable anemia.  CMP without critical derangements.  Urine without evidence of infection.  Chest x-Kym Fenter with questionable area of atelectasis versus pneumonia, better visualized on CT without evidence of pneumonia, reflective of likely atelectasis.  Reviewed case with Dr. Evie Lacks with vascular surgery team.  She reported is not atypical to have a fever at this stage secondary to a reaction from the patient's graft.  With overall reassuring workup, she did feel discharged without antibiotics was reasonable with strict return precautions.  Reported that patient was uncomfortable with this plan due to her comorbidities, could consider observation admission for further fever monitoring.  I did discuss results of workup as well as discussion with vascular surgery with patient and her daughter.  They are comfortable with discharge home with strict return precautions for any worsening symptoms.  They do understand that if her blood cultures return positive, she will be called and will likely need to return at that time as well. Discussed use of incentive spirometer in the setting of her atelectasis, family reports patient has this at home and will use this regularly for the next few days.  Patient was discharged in stable condition.    FINAL CLINICAL IMPRESSION(S) / ED DIAGNOSES   Final diagnoses:  Postoperative fever     Rx / DC Orders   ED Discharge Orders     None        Note:  This document was prepared using Dragon voice recognition software and may include unintentional dictation errors.   Trinna Post, MD 07/02/23 [phone]

## 2023-07-02 NOTE — ED Notes (Signed)
Dressed pt's surgical wounds with sterile gauze and cloth tape

## 2023-07-03 DIAGNOSIS — J441 Chronic obstructive pulmonary disease with (acute) exacerbation: Secondary | ICD-10-CM | POA: Diagnosis not present

## 2023-07-03 DIAGNOSIS — J9601 Acute respiratory failure with hypoxia: Secondary | ICD-10-CM | POA: Diagnosis not present

## 2023-07-05 ENCOUNTER — Encounter: Payer: Self-pay | Admitting: Vascular Surgery

## 2023-07-05 ENCOUNTER — Telehealth: Payer: Self-pay

## 2023-07-05 ENCOUNTER — Telehealth (INDEPENDENT_AMBULATORY_CARE_PROVIDER_SITE_OTHER): Payer: Self-pay

## 2023-07-05 NOTE — Telephone Encounter (Addendum)
Received 2 voicemails from the patients daughter one from Friday and one from this morning at 8:13 am . Rosey Bath stated that her mother had a surgery with Dr.Dew last Wednesday and now has a fever ranging from 100.2-100.9. Wanted to bring her in to have her sutures checked out.  According to her chart she was seen in the ED 07/02/23 Patient was taken by the daughter and had her checked out. She stated she is now waiting on a culture to come back to be sure there wasn't any infection causing the fever.

## 2023-07-05 NOTE — Telephone Encounter (Signed)
As of right now the cultures are still pending, but there has been no growth noted so far

## 2023-07-05 NOTE — Transitions of Care (Post Inpatient/ED Visit) (Signed)
07/05/2023  Name: Jill Guzman MRN: [id number] DOB: [birth year]  Today's TOC FU Call Status: Today's TOC FU Call Status:: Successful TOC FU Call Completed TOC FU Call Complete Date: 07/05/23 Patient's Name and Date of Birth confirmed.  Transition Care Management Follow-up Telephone Call Date of Discharge: 07/02/23 Discharge Facility: [facility]) Type of Discharge: Inpatient Admission Primary Inpatient Discharge Diagnosis:: AAA rupture back to ER for elevated temp < 24 hrs How have you been since you were released from the hospital?: Better Any questions or concerns?: No  Items Reviewed: Did you receive and understand the discharge instructions provided?: Yes (Had concerns regarding elevated temp, called number provided was not able to reach a provider,she did end up back to the ER) Medications obtained,verified, and reconciled?: Yes (Medications Reviewed) Any new allergies since your discharge?: No Dietary orders reviewed?: Yes Type of Diet Ordered:: Reg Heart Healthy, NAS Do you have support at home?: Yes People in Home: child(ren), adult Name of Support/Comfort Primary Source: Daughter Rosey Bath  Medications Reviewed Today: Medications Reviewed Today     Reviewed by Johnnette Barrios, RN (Registered Nurse) on 07/05/23 at 1342  Med List Status: <None>   Medication Order Taking? Sig Documenting Provider Last Dose Status Informant  ACCU-CHEK GUIDE test strip [id number] Yes USE 1 STRIP TO CHECK GLUCOSE FOR BLOOD SUGAR THREE TIMES DAILY AND FOR SYMPTOMS OF HIGH OR LOW BLOOD SUGAR [provider] Taking Active Child  acetaminophen (TYLENOL) 500 MG tablet [id number] Yes Take by mouth. [provider] Taking Active Child  albuterol (VENTOLIN HFA) 108 (90 Base) MCG/ACT inhaler [id number] Yes INHALE 1 PUFF BY MOUTH 4 TIMES DAILY Duanne Limerick, MD Taking Active Child  amLODipine (NORVASC) 5 MG tablet [id number] No Take 1 tablet (5 mg total) by  mouth daily.  Patient not taking: Reported on 07/05/2023   Antonieta Iba, MD Not Taking Active Child  apixaban (ELIQUIS) 5 MG TABS tablet [id number]  Take 5 mg by mouth 2 (two) times daily. [provider]  Expired 06/21/23 2359 Child, Family Member  atorvastatin (LIPITOR) 20 MG tablet [id number] Yes Take 1 tablet (20 mg total) by mouth daily. Antonieta Iba, MD Taking Active Child  Blood Glucose Monitoring Suppl (ACCU-CHEK GUIDE ME) w/Device Andria Rhein [id number] Yes Test once daily Duanne Limerick, MD Taking Active Child  Cholecalciferol (VITAMIN D) 50 MCG (2000 UT) CAPS [id number] Yes Take by mouth. [provider] Taking Active Child  ezetimibe (ZETIA) 10 MG tablet [id number] Yes Take 1 tablet (10 mg total) by mouth daily. Antonieta Iba, MD Taking Active Child  famotidine (PEPCID) 20 MG tablet [id number] Yes Take by mouth. [provider] Taking Active Child  fluticasone (FLONASE) 50 MCG/ACT nasal spray [id number] Yes Place 2 sprays into both nostrils daily. [provider] Taking Active Child           Med Note Sharon Seller, Kashmir Leedy L   Mon Jul 05, 2023  1:40 PM) As needed   folic acid (FOLVITE) 1 MG tablet [id number] Yes Take 1 mg by mouth daily. [provider] Taking Active Child  HYDROcodone-acetaminophen (NORCO/VICODIN) 5-325 MG tablet [id number] Yes Take 2 tablets by mouth every 6 (six) hours as needed for moderate pain (pain score 4-6). Annice Needy, MD Taking Active   lidocaine-prilocaine (EMLA) cream [id number] Yes Apply to small amount to port site and cover with occlusive dressing 1 hr prior to access. [provider] Taking Active Child  losartan (COZAAR) 50 MG tablet [id number]  Yes Take 1 tablet (50 mg total) by mouth 2 (two) times daily. Antonieta Iba, MD Taking Active Child  metFORMIN (GLUCOPHAGE) 500 MG tablet [id number] Yes Take 1 tablet (500 mg total) by mouth 2 (two) times daily.  Patient taking differently: Take 500 mg by mouth 2  (two) times daily with a meal. Pt taking 500 mg in AM and 500mg  at night- endocrinology   Duanne Limerick, MD Taking Active Child  montelukast (SINGULAIR) 10 MG tablet [id number] Yes TAKE 1 TABLET BY MOUTH AT BEDTIME Duanne Limerick, MD Taking Active Child  RELION PEN NEEDLES 32G X 4 MM MISC [id number] Yes  [provider] Taking Active Child  sertraline (ZOLOFT) 50 MG tablet [id number] Yes Take 1 tablet by mouth once daily Duanne Limerick, MD Taking Active Child  SYMBICORT 160-4.5 MCG/ACT inhaler [id number] Yes Inhale 2 puffs into the lungs 2 (two) times daily. [provider] Taking Active Child            Home Care and Equipment/Supplies: Were Home Health Services Ordered?: No Any new equipment or medical supplies ordered?: No  Functional Questionnaire: Do you need assistance with bathing/showering or dressing?: No Do you need assistance with meal preparation?: No Do you need assistance with eating?: No Do you have difficulty maintaining continence: No Do you need assistance with getting out of bed/getting out of a chair/moving?: No (moves slow) Do you have difficulty managing or taking your medications?: No  Follow up appointments reviewed: Specialist Hospital Follow-up appointment confirmed?: Yes Date of Specialist follow-up appointment?: 08/02/23 Follow-Up Specialty Provider:: Vascular follow-up Do you need transportation to your follow-up appointment?: No (Daughter drives) Do you understand care options if your condition(s) worsen?: Yes-patient verbalized understanding    Discussed [facility]  TOC program and weekly calls to patient to assess condition/status, medication management  and provide support/education as indicated . Patient/ Caregiver voiced understanding and declined enrollment in the 30-day TOC Program.    Followed by PCP, Vascular and Oncology.She is doing well, up ad lib,.  Fever resolved , labs pending, prelim no growth.   The patient has been  provided with contact information for the care management team and has been advised to call with any health related questions or concerns.     Susa Loffler , BSN, RN Care Management Coordinator [place]   [facility] [email or web address] Direct Dial: [phone]

## 2023-07-06 NOTE — Telephone Encounter (Signed)
Daughter notified 

## 2023-07-07 LAB — CULTURE, BLOOD (SINGLE)
Culture: NO GROWTH
Culture: NO GROWTH
Special Requests: ADEQUATE
Special Requests: ADEQUATE

## 2023-07-15 DIAGNOSIS — Z794 Long term (current) use of insulin: Secondary | ICD-10-CM | POA: Diagnosis not present

## 2023-07-15 DIAGNOSIS — Z5112 Encounter for antineoplastic immunotherapy: Secondary | ICD-10-CM | POA: Diagnosis not present

## 2023-07-15 DIAGNOSIS — E785 Hyperlipidemia, unspecified: Secondary | ICD-10-CM | POA: Diagnosis not present

## 2023-07-15 DIAGNOSIS — Z7901 Long term (current) use of anticoagulants: Secondary | ICD-10-CM | POA: Diagnosis not present

## 2023-07-15 DIAGNOSIS — Z79899 Other long term (current) drug therapy: Secondary | ICD-10-CM | POA: Diagnosis not present

## 2023-07-15 DIAGNOSIS — Z7951 Long term (current) use of inhaled steroids: Secondary | ICD-10-CM | POA: Diagnosis not present

## 2023-07-15 DIAGNOSIS — Z87891 Personal history of nicotine dependence: Secondary | ICD-10-CM | POA: Diagnosis not present

## 2023-07-15 DIAGNOSIS — E1169 Type 2 diabetes mellitus with other specified complication: Secondary | ICD-10-CM | POA: Diagnosis not present

## 2023-07-15 DIAGNOSIS — C7931 Secondary malignant neoplasm of brain: Secondary | ICD-10-CM | POA: Diagnosis not present

## 2023-07-15 DIAGNOSIS — R9389 Abnormal findings on diagnostic imaging of other specified body structures: Secondary | ICD-10-CM | POA: Diagnosis not present

## 2023-07-15 DIAGNOSIS — Z7984 Long term (current) use of oral hypoglycemic drugs: Secondary | ICD-10-CM | POA: Diagnosis not present

## 2023-07-15 DIAGNOSIS — Z880 Allergy status to penicillin: Secondary | ICD-10-CM | POA: Diagnosis not present

## 2023-07-15 DIAGNOSIS — C3490 Malignant neoplasm of unspecified part of unspecified bronchus or lung: Secondary | ICD-10-CM | POA: Diagnosis not present

## 2023-07-15 DIAGNOSIS — Z1159 Encounter for screening for other viral diseases: Secondary | ICD-10-CM | POA: Diagnosis not present

## 2023-07-15 DIAGNOSIS — Z881 Allergy status to other antibiotic agents status: Secondary | ICD-10-CM | POA: Diagnosis not present

## 2023-07-15 DIAGNOSIS — Z91041 Radiographic dye allergy status: Secondary | ICD-10-CM | POA: Diagnosis not present

## 2023-07-15 DIAGNOSIS — Z882 Allergy status to sulfonamides status: Secondary | ICD-10-CM | POA: Diagnosis not present

## 2023-07-15 DIAGNOSIS — Z5111 Encounter for antineoplastic chemotherapy: Secondary | ICD-10-CM | POA: Diagnosis not present

## 2023-07-15 DIAGNOSIS — E119 Type 2 diabetes mellitus without complications: Secondary | ICD-10-CM | POA: Diagnosis not present

## 2023-07-15 DIAGNOSIS — I10 Essential (primary) hypertension: Secondary | ICD-10-CM | POA: Diagnosis not present

## 2023-07-21 ENCOUNTER — Other Ambulatory Visit: Payer: Self-pay | Admitting: Family Medicine

## 2023-07-23 ENCOUNTER — Other Ambulatory Visit (INDEPENDENT_AMBULATORY_CARE_PROVIDER_SITE_OTHER): Payer: Self-pay | Admitting: Vascular Surgery

## 2023-07-23 DIAGNOSIS — I714 Abdominal aortic aneurysm, without rupture, unspecified: Secondary | ICD-10-CM

## 2023-07-27 ENCOUNTER — Other Ambulatory Visit: Payer: Self-pay | Admitting: Family Medicine

## 2023-08-02 ENCOUNTER — Encounter (INDEPENDENT_AMBULATORY_CARE_PROVIDER_SITE_OTHER): Payer: Self-pay | Admitting: Nurse Practitioner

## 2023-08-02 ENCOUNTER — Ambulatory Visit (INDEPENDENT_AMBULATORY_CARE_PROVIDER_SITE_OTHER): Payer: Medicare Other

## 2023-08-02 ENCOUNTER — Ambulatory Visit (INDEPENDENT_AMBULATORY_CARE_PROVIDER_SITE_OTHER): Payer: Medicare Other | Admitting: Nurse Practitioner

## 2023-08-02 VITALS — BP 146/76 | HR 72 | Resp 16 | Wt 118.2 lb

## 2023-08-02 DIAGNOSIS — E119 Type 2 diabetes mellitus without complications: Secondary | ICD-10-CM

## 2023-08-02 DIAGNOSIS — J4489 Other specified chronic obstructive pulmonary disease: Secondary | ICD-10-CM | POA: Diagnosis not present

## 2023-08-02 DIAGNOSIS — I7143 Infrarenal abdominal aortic aneurysm, without rupture: Secondary | ICD-10-CM

## 2023-08-02 DIAGNOSIS — I10 Essential (primary) hypertension: Secondary | ICD-10-CM

## 2023-08-02 DIAGNOSIS — C7931 Secondary malignant neoplasm of brain: Secondary | ICD-10-CM | POA: Diagnosis not present

## 2023-08-02 DIAGNOSIS — C3490 Malignant neoplasm of unspecified part of unspecified bronchus or lung: Secondary | ICD-10-CM | POA: Diagnosis not present

## 2023-08-02 DIAGNOSIS — R0609 Other forms of dyspnea: Secondary | ICD-10-CM | POA: Diagnosis not present

## 2023-08-02 DIAGNOSIS — I2694 Multiple subsegmental pulmonary emboli without acute cor pulmonale: Secondary | ICD-10-CM | POA: Diagnosis not present

## 2023-08-02 DIAGNOSIS — J984 Other disorders of lung: Secondary | ICD-10-CM | POA: Diagnosis not present

## 2023-08-02 DIAGNOSIS — I714 Abdominal aortic aneurysm, without rupture, unspecified: Secondary | ICD-10-CM | POA: Diagnosis not present

## 2023-08-02 NOTE — Progress Notes (Signed)
Subjective:    Patient ID: Jill Guzman, female    DOB: 1941-02-21, 82 y.o.   MRN: [id number] No chief complaint on file.   The patient returns to the office for surveillance of an abdominal aortic aneurysm status post stent graft placement on 06/30/2023.   Procedure: PROCEDURE: 1. US guidance for vascular access, bilateral femoral arteries 2. Catheter placement into aorta from bilateral femoral approaches 3. Placement of a C3 23 x 12 x 12 Gore Excluder Endoprosthesis main body with a 12 x 10 contralateral limb 4. Placement of a 12 mm x 38 mm Lifestream stent postdilated to 14 mm in the contralateral gate to treat persistent narrowing of the limb secondary to the aneurysm morphology.   5. ProGlide closure devices bilateral femoral arteries   Patient denies abdominal pain or back pain, no other abdominal complaints.  No symptoms consistent with distal embolization No changes in claudication distance or new rest pain symptoms. No interval development of new ulcers or wounds  There have been no significant interval changes in his overall healthcare since his last visit.   Patient denies amaurosis fugax or TIA symptoms.  The patient denies recent episodes of angina or shortness of breath.   Duplex US of the aorta and iliac arteries shows a 5.51 AAA sac with no  endoleak, decrease in the sac compared to the previous study.    Review of Systems  Cardiovascular:  Negative for leg swelling.  Skin:  Negative for wound.  All other systems reviewed and are negative.      Objective:   Physical Exam Vitals reviewed.  HENT:     Head: Normocephalic.  Cardiovascular:     Rate and Rhythm: Normal rate.     Pulses:          Dorsalis pedis pulses are 2+ on the right side and 2+ on the left side.       Posterior tibial pulses are 1+ on the right side and 1+ on the left side.  Pulmonary:     Effort: Pulmonary effort is normal.  Skin:    General: Skin is warm and dry.  Neurological:      Mental Status: She is alert and oriented to person, place, and time.  Psychiatric:        Mood and Affect: Mood normal.        Behavior: Behavior normal.        Thought Content: Thought content normal.        Judgment: Judgment normal.     There were no vitals taken for this visit.  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   a.) US aorta 07/09/2020: 4.2 cm   Anxiety    Asthma    Bell's palsy    age 27 and age 51    COPD (chronic obstructive pulmonary disease) (HCC)    Deaf, right    Depression    Diabetes mellitus without complication (HCC)    Full dentures    Hearing aid worn (LEFT)    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 mellitus (HCC)    Hypertension    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)    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)    Nonexudative age-related macular degeneration, bilateral, early dry stage    On apixaban therapy    Osteopenia    Shingles 08/13/2020   Varicose veins of legs    Vertigo    random, approx 1x/month    Social History   Socioeconomic History   Marital status: Divorced    Spouse name: Not on file   Number of children: 5   Years of education: Not on file   Highest education level: 12th grade  Occupational History   Occupation: Retired  Tobacco Use   Smoking status: Former    Current packs/day: 0.00    Average packs/day: 2.0 packs/day for 10.0 years (20.0 ttl pk-yrs)    Types: Cigarettes    Start date: 05/10/1989    Quit date: 05/11/1999     Years since quitting: 24.2   Smokeless tobacco: Never   Tobacco comments:    Smoking cessation materials not required  Vaping Use   Vaping status: Never Used  Substance and Sexual Activity   Alcohol use: Never   Drug use: Never   Sexual activity: Not Currently    Birth control/protection: Post-menopausal  Other Topics Concern   Not on file  Social History Narrative   Pt lives by herself, 3 children living. Daughter passed away Aug 21, 2016 from car accident and son passed away August 21, 2010 after living with her for 10 years after recovery of burns and lung damage. Daughter Rosey Bath Lives with pt. Most of the time: she lives next door    Social Determinants of Health   Financial Resource Strain: Low Risk  (03/10/2023)   Overall Financial Resource Strain (CARDIA)    Difficulty of Paying Living Expenses: Not hard at all  Food Insecurity: No Food Insecurity (06/30/2023)   Hunger Vital Sign    Worried About Running Out of Food in the Last Year: Never true    Ran Out of Food in the Last Year: Never true  Transportation Needs: No Transportation Needs (06/30/2023)   PRAPARE - Administrator, Civil Service (Medical): No    Lack of Transportation (Non-Medical): No  Physical Activity: Insufficiently Active (03/10/2023)   Exercise Vital Sign    Days of Exercise per Week: 7 days    Minutes of Exercise per Session: 10 min  Stress: No Stress Concern Present (03/10/2023)   [employer] of Occupational Health - Occupational Stress Questionnaire    Feeling of Stress : Only a little  Social Connections: Moderately Isolated (03/10/2023)   Social Connection and Isolation Panel [NHANES]    Frequency of Communication with Friends and Family: More than three times a week    Frequency of Social Gatherings with Friends and Family: More than three times a week    Attends Religious Services: Never    Database administrator or Organizations: Yes    Attends Banker Meetings: Never    Marital Status:  Divorced  Catering manager Violence: Not At Risk (06/30/2023)   Humiliation, Afraid, Rape, and Kick questionnaire    Fear of Current or Ex-Partner: No    Emotionally Abused: No    Physically Abused: No    Sexually Abused: No    Past Surgical History:  Procedure Laterality Date   ABDOMINAL HYSTERECTOMY     BREAST EXCISIONAL BIOPSY Left 1976  neg   CATARACT EXTRACTION W/ INTRAOCULAR LENS  IMPLANT, BILATERAL     COLONOSCOPY  2014   normal   cyst on bladder removed     cyst removed breast Left    ENDOVASCULAR REPAIR/STENT GRAFT N/A 06/30/2023   Procedure: ENDOVASCULAR REPAIR/STENT GRAFT;  Surgeon: Annice Needy, MD;  Location: [facility] IN[facility]IVE [facility];  Service: Cardiovascular;  Laterality: N/A;   KNEE ARTHROSCOPY WITH MEDIAL MENISECTOMY Left 10/20/2019   Procedure: KNEE ARTHROSCOPY WITH PARTIAL MEDIAL MENISECTOMY;  Surgeon: Signa Kell, MD;  Location: [facility];  Service: Orthopedics;  Laterality: Left;  DIABETIC - oral meds   PARTIAL HYSTERECTOMY     TUBAL LIGATION      Family History  Problem Relation Age of Onset   Diabetes Mother    Stroke Mother    Healthy Daughter    Cancer Son 19       lung   Hypertension Son    Rheum arthritis Daughter    Hypertension Daughter    Arthritis Daughter    COPD Son    Breast cancer Neg Hx     Allergies  Allergen Reactions   Cefepime Rash    Tolerated 3rd generation cephalosporin (CEFTRIAXONE) 08/18/2022 with no documented ADRs.    Penicillins Itching    Tolerated 3rd generation cephalosporin (CEFTRIAXONE) 08/18/2022 with no documented ADRs.    Sulfa Antibiotics Itching   Iodinated Contrast Media Itching    Redness and itchiness   Vancomycin Rash       Latest Ref Rng & Units 07/02/2023    6:05 PM 07/01/2023    3:13 AM 06/30/2023    2:17 PM  CBC  WBC 4.0 - 10.5 K/uL 12.0  9.2  5.3   Hemoglobin 12.0 - 15.0 g/dL 16.1  09.6  04.5   Hematocrit 36.0 - 46.0 % 33.2  30.6  35.7   Platelets 150 - 400 K/uL 252  179  181        CMP     Component Value Date/Time   NA 132 (L) 07/02/2023 1805   NA 140 06/24/2022 0956   K 3.7 07/02/2023 1805   CL 97 (L) 07/02/2023 1805   CO2 25 07/02/2023 1805   GLUCOSE 151 (H) 07/02/2023 1805   BUN 12 07/02/2023 1805   BUN 12 06/24/2022 0956   CREATININE 0.63 07/02/2023 1805   CALCIUM 9.6 07/02/2023 1805   PROT 7.1 07/02/2023 1805   PROT 7.0 06/24/2022 0956   ALBUMIN 4.2 07/02/2023 1805   ALBUMIN 4.8 06/24/2022 0956   AST 14 (L) 07/02/2023 1805   ALT 17 07/02/2023 1805   ALKPHOS 45 07/02/2023 1805   BILITOT 1.2 07/02/2023 1805   BILITOT 0.5 06/24/2022 0956   EGFR 75 06/24/2022 0956   GFRNONAA >60 07/02/2023 1805     No results found.     Assessment & Plan:   1. Infrarenal abdominal aortic aneurysm (AAA) without rupture (HCC) Recommend:  Patient is status post successful endovascular repair of the AAA.   No further intervention is required at this time.   No endoleak is detected and the aneurysm sac is stable.  The patient will continue antiplatelet therapy as prescribed as well as aggressive management of hyperlipidemia. Exercise is encouraged.   However, endografts require continued surveillance with ultrasound or CT scan. This is mandatory to detect any changes that allow repressurization of the aneurysm sac.  The patient is informed that this would be asymptomatic.  The patient is reminded that lifelong routine surveillance is  a necessity with an endograft. Patient will continue to follow-up at the specified interval with ultrasound of the aorta.  2. Primary hypertension  Continue antihypertensive medications as already ordered, these medications have been reviewed and there are no changes at this time. 3. Type 2 diabetes mellitus without complication, without long-term current use of insulin (HCC) Continue hypoglycemic medications as already ordered, these medications have been reviewed and there are no changes at this time.  Hgb A1C to be  monitored as already arranged by primary service   Current Outpatient Medications on File Prior to Visit  Medication Sig Dispense Refill   ACCU-CHEK GUIDE test strip USE 1 STRIP TO CHECK GLUCOSE FOR BLOOD SUGAR THREE TIMES DAILY AND FOR SYMPTOMS OF HIGH OR LOW BLOOD SUGAR     acetaminophen (TYLENOL) 500 MG tablet Take by mouth.     albuterol (VENTOLIN HFA) 108 (90 Base) MCG/ACT inhaler INHALE 1 PUFF BY MOUTH 4 TIMES DAILY 18 g 5   amLODipine (NORVASC) 5 MG tablet Take 1 tablet (5 mg total) by mouth daily. (Patient not taking: Reported on 07/05/2023) 90 tablet 3   apixaban (ELIQUIS) 5 MG TABS tablet Take 5 mg by mouth 2 (two) times daily.     atorvastatin (LIPITOR) 20 MG tablet Take 1 tablet (20 mg total) by mouth daily. 90 tablet 3   Blood Glucose Monitoring Suppl (ACCU-CHEK GUIDE ME) w/Device KIT Test once daily 1 kit 0   Cholecalciferol (VITAMIN D) 50 MCG (2000 UT) CAPS Take by mouth.     ezetimibe (ZETIA) 10 MG tablet Take 1 tablet (10 mg total) by mouth daily. 90 tablet 3   fluticasone (FLONASE) 50 MCG/ACT nasal spray Use 2 spray(s) in each nostril once daily 16 g 0   folic acid (FOLVITE) 1 MG tablet Take 1 mg by mouth daily.     HYDROcodone-acetaminophen (NORCO/VICODIN) 5-325 MG tablet Take 2 tablets by mouth every 6 (six) hours as needed for moderate pain (pain score 4-6). 20 tablet 0   lidocaine-prilocaine (EMLA) cream Apply to small amount to port site and cover with occlusive dressing 1 hr prior to access.     losartan (COZAAR) 50 MG tablet Take 1 tablet (50 mg total) by mouth 2 (two) times daily. 180 tablet 3   metFORMIN (GLUCOPHAGE) 500 MG tablet Take 1 tablet (500 mg total) by mouth 2 (two) times daily. (Patient taking differently: Take 500 mg by mouth 2 (two) times daily with a meal. Pt taking 500 mg in AM and 500mg  at night- endocrinology) 180 tablet 6   montelukast (SINGULAIR) 10 MG tablet TAKE 1 TABLET BY MOUTH AT BEDTIME 90 tablet 0   RELION PEN NEEDLES 32G X 4 MM MISC       sertraline (ZOLOFT) 50 MG tablet Take 1 tablet by mouth once daily 90 tablet 0   SYMBICORT 160-4.5 MCG/ACT inhaler Inhale 2 puffs into the lungs 2 (two) times daily.     No current facility-administered medications on file prior to visit.    There are no Patient Instructions on file for this visit. No follow-ups on file.   Georgiana Spinner, NP

## 2023-08-03 DIAGNOSIS — J9601 Acute respiratory failure with hypoxia: Secondary | ICD-10-CM | POA: Diagnosis not present

## 2023-08-03 DIAGNOSIS — J441 Chronic obstructive pulmonary disease with (acute) exacerbation: Secondary | ICD-10-CM | POA: Diagnosis not present

## 2023-08-09 DIAGNOSIS — Z5112 Encounter for antineoplastic immunotherapy: Secondary | ICD-10-CM | POA: Diagnosis not present

## 2023-08-09 DIAGNOSIS — C3490 Malignant neoplasm of unspecified part of unspecified bronchus or lung: Secondary | ICD-10-CM | POA: Diagnosis not present

## 2023-08-09 DIAGNOSIS — R9389 Abnormal findings on diagnostic imaging of other specified body structures: Secondary | ICD-10-CM | POA: Diagnosis not present

## 2023-08-09 DIAGNOSIS — Z1159 Encounter for screening for other viral diseases: Secondary | ICD-10-CM | POA: Diagnosis not present

## 2023-08-09 DIAGNOSIS — Z7901 Long term (current) use of anticoagulants: Secondary | ICD-10-CM | POA: Diagnosis not present

## 2023-08-09 DIAGNOSIS — E119 Type 2 diabetes mellitus without complications: Secondary | ICD-10-CM | POA: Diagnosis not present

## 2023-08-09 DIAGNOSIS — Z79899 Other long term (current) drug therapy: Secondary | ICD-10-CM | POA: Diagnosis not present

## 2023-08-09 DIAGNOSIS — C7931 Secondary malignant neoplasm of brain: Secondary | ICD-10-CM | POA: Diagnosis not present

## 2023-08-09 DIAGNOSIS — Z882 Allergy status to sulfonamides status: Secondary | ICD-10-CM | POA: Diagnosis not present

## 2023-08-09 DIAGNOSIS — E7849 Other hyperlipidemia: Secondary | ICD-10-CM | POA: Diagnosis not present

## 2023-08-09 DIAGNOSIS — Z5111 Encounter for antineoplastic chemotherapy: Secondary | ICD-10-CM | POA: Diagnosis not present

## 2023-08-09 DIAGNOSIS — Z87891 Personal history of nicotine dependence: Secondary | ICD-10-CM | POA: Diagnosis not present

## 2023-08-09 DIAGNOSIS — G939 Disorder of brain, unspecified: Secondary | ICD-10-CM | POA: Diagnosis not present

## 2023-08-09 DIAGNOSIS — Z7984 Long term (current) use of oral hypoglycemic drugs: Secondary | ICD-10-CM | POA: Diagnosis not present

## 2023-08-09 DIAGNOSIS — I10 Essential (primary) hypertension: Secondary | ICD-10-CM | POA: Diagnosis not present

## 2023-08-09 DIAGNOSIS — Z794 Long term (current) use of insulin: Secondary | ICD-10-CM | POA: Diagnosis not present

## 2023-08-16 ENCOUNTER — Other Ambulatory Visit: Payer: Self-pay | Admitting: Family Medicine

## 2023-08-16 ENCOUNTER — Encounter: Payer: Self-pay | Admitting: Family Medicine

## 2023-08-16 ENCOUNTER — Ambulatory Visit: Payer: Medicare Other | Admitting: Family Medicine

## 2023-08-16 VITALS — BP 137/67 | HR 81 | Ht 62.0 in | Wt 117.0 lb

## 2023-08-16 DIAGNOSIS — C3490 Malignant neoplasm of unspecified part of unspecified bronchus or lung: Secondary | ICD-10-CM | POA: Diagnosis not present

## 2023-08-16 DIAGNOSIS — I10 Essential (primary) hypertension: Secondary | ICD-10-CM

## 2023-08-16 DIAGNOSIS — F329 Major depressive disorder, single episode, unspecified: Secondary | ICD-10-CM

## 2023-08-16 DIAGNOSIS — Z7984 Long term (current) use of oral hypoglycemic drugs: Secondary | ICD-10-CM | POA: Diagnosis not present

## 2023-08-16 DIAGNOSIS — C7931 Secondary malignant neoplasm of brain: Secondary | ICD-10-CM | POA: Diagnosis not present

## 2023-08-16 DIAGNOSIS — E119 Type 2 diabetes mellitus without complications: Secondary | ICD-10-CM | POA: Diagnosis not present

## 2023-08-16 DIAGNOSIS — C3492 Malignant neoplasm of unspecified part of left bronchus or lung: Secondary | ICD-10-CM | POA: Diagnosis not present

## 2023-08-16 DIAGNOSIS — F3341 Major depressive disorder, recurrent, in partial remission: Secondary | ICD-10-CM

## 2023-08-16 MED ORDER — LOSARTAN POTASSIUM 50 MG PO TABS: 50.0000 mg | ORAL_TABLET | Freq: Every day | ORAL | 1 refills | Status: AC

## 2023-08-16 MED ORDER — SERTRALINE HCL 50 MG PO TABS: 50.0000 mg | ORAL_TABLET | Freq: Every day | ORAL | 1 refills | Status: AC

## 2023-08-16 NOTE — Progress Notes (Signed)
Date:  08/16/2023   Name:  Jill Guzman   DOB:  03-31-1941   MRN:  [id number]   Chief Complaint: Hypertension and Depression  Hypertension This is a chronic problem. The current episode started more than 1 year ago. The problem has been gradually improving since onset. The problem is controlled. Pertinent negatives include no anxiety, blurred vision, chest pain, headaches, malaise/fatigue, neck pain, orthopnea, palpitations, peripheral edema, PND, shortness of breath or sweats. There are no associated agents to hypertension. There are no known risk factors for coronary artery disease. Past treatments include angiotensin blockers. The current treatment provides moderate improvement. There are no compliance problems.  There is no history of chronic renal disease, a hypertension causing med or renovascular disease.  Depression        This is a chronic problem.  The current episode started more than 1 year ago.   The onset quality is gradual.   The problem occurs constantly.  The problem has been waxing and waning since onset.  Associated symptoms include decreased interest and sad.  Associated symptoms include no decreased concentration, no fatigue, no helplessness, no hopelessness, no restlessness and no headaches.     Exacerbated by: Medical condition.  Past treatments include SSRIs - Selective serotonin reuptake inhibitors.  Compliance with treatment is good.   Pertinent negatives include no anxiety.   Lab Results  Component Value Date   NA 132 (L) 07/02/2023   K 3.7 07/02/2023   CO2 25 07/02/2023   GLUCOSE 151 (H) 07/02/2023   BUN 12 07/02/2023   CREATININE 0.63 07/02/2023   CALCIUM 9.6 07/02/2023   EGFR 75 06/24/2022   GFRNONAA >60 07/02/2023   Lab Results  Component Value Date   CHOL 191 06/24/2022   HDL 55 06/24/2022   LDLCALC 115 (H) 06/24/2022   TRIG 120 06/24/2022   CHOLHDL 2.9 03/29/2019   Lab Results  Component Value Date   TSH 1.69 06/13/2021   Lab Results   Component Value Date   HGBA1C 7.2 05/07/2023   Lab Results  Component Value Date   WBC 12.0 (H) 07/02/2023   HGB 10.7 (L) 07/02/2023   HCT 33.2 (L) 07/02/2023   MCV 85.3 07/02/2023   PLT 252 07/02/2023   Lab Results  Component Value Date   ALT 17 07/02/2023   AST 14 (L) 07/02/2023   ALKPHOS 45 07/02/2023   BILITOT 1.2 07/02/2023   No results found for: "25OHVITD2", "25OHVITD3", "VD25OH"   Review of Systems  Constitutional:  Negative for diaphoresis, fatigue, fever, malaise/fatigue and unexpected weight change.  Eyes:  Negative for blurred vision and visual disturbance.  Respiratory:  Negative for choking, chest tightness, shortness of breath and wheezing.   Cardiovascular:  Negative for chest pain, palpitations, orthopnea and PND.  Gastrointestinal:  Negative for abdominal distention, abdominal pain and blood in stool.  Endocrine: Negative for polydipsia and polyuria.  Genitourinary:  Negative for difficulty urinating, hematuria and vaginal bleeding.  Musculoskeletal:  Negative for neck pain.  Neurological:  Negative for headaches.  Psychiatric/Behavioral:  Positive for depression. Negative for decreased concentration.     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  Nonexudative age-related macular degeneration, bilateral, early dry stage 03/27/2019   Recurrent major depressive disorder, in partial remission (HCC) 02/14/2018   Age-related osteoporosis without current pathological fracture 11/01/2017   Type 2 diabetes mellitus without complication, without long-term current use of insulin (HCC) 11/01/2017    Hyperlipidemia due to type 2 diabetes mellitus (HCC) 11/01/2017   Chronic anxiety 02/09/2017   AB (asthmatic bronchitis), mild intermittent, uncomplicated 02/09/2017   Chronic obstructive pulmonary disease (HCC) 07/14/2016   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    Penicillins Itching    Tolerated 3rd generation cephalosporin (CEFTRIAXONE) 08/18/2022 with no documented ADRs.    Sulfa Antibiotics Itching   Iodinated Contrast Media Itching    Redness and itchiness   Vancomycin Rash    Past Surgical History:  Procedure Laterality Date   ABDOMINAL HYSTERECTOMY     BREAST EXCISIONAL BIOPSY Left 1976   neg   CATARACT EXTRACTION W/ INTRAOCULAR LENS  IMPLANT, BILATERAL     COLONOSCOPY  2014   normal   cyst on bladder removed     cyst removed breast Left    ENDOVASCULAR REPAIR/STENT GRAFT N/A 06/30/2023   Procedure: ENDOVASCULAR REPAIR/STENT GRAFT;  Surgeon: Annice Needy, MD;  Location: [facility] INV[facility];  Service: Cardiovascular;  Laterality: N/A;   KNEE ARTHROSCOPY WITH MEDIAL MENISECTOMY Left 10/20/2019   Procedure: KNEE ARTHROSCOPY WITH PARTIAL MEDIAL MENISECTOMY;  Surgeon: Signa Kell, MD;  Location: [facility];  Service: Orthopedics;  Laterality: Left;  DIABETIC - oral meds   PARTIAL HYSTERECTOMY     TUBAL LIGATION      Social History   Tobacco Use   Smoking status: Former    Current packs/day: 0.00    Average packs/day: 2.0 packs/day for 10.0 years (20.0 ttl pk-yrs)    Types: Cigarettes    Start date: 05/10/1989    Quit date: 05/11/1999    Years since quitting:  24.2   Smokeless tobacco: Never   Tobacco comments:    Smoking cessation materials not required  Vaping Use   Vaping status: Never Used  Substance Use Topics   Alcohol use: Never   Drug use: Never     Medication list has been reviewed and updated.  Current Meds  Medication Sig   ACCU-CHEK GUIDE test strip USE 1 STRIP TO CHECK GLUCOSE FOR BLOOD SUGAR THREE TIMES DAILY AND FOR SYMPTOMS OF HIGH OR LOW BLOOD SUGAR   acetaminophen (TYLENOL) 500 MG tablet Take by mouth.   albuterol (VENTOLIN HFA) 108 (90 Base) MCG/ACT inhaler INHALE 1 PUFF BY MOUTH 4 TIMES DAILY   apixaban (ELIQUIS) 5 MG TABS tablet Take 5 mg by mouth 2 (two) times daily.   atorvastatin (LIPITOR) 20 MG tablet Take 1 tablet (20 mg total) by mouth daily.   Blood Glucose Monitoring Suppl (ACCU-CHEK GUIDE ME) w/Device KIT Test once daily   Cholecalciferol (VITAMIN D) 50 MCG (2000 UT) CAPS Take by mouth.   ezetimibe (ZETIA) 10 MG tablet Take 1 tablet (10 mg total) by mouth daily.   fluticasone (FLONASE) 50 MCG/ACT nasal spray Use 2 spray(s) in each nostril once daily   folic acid (FOLVITE) 1  MG tablet Take 1 mg by mouth daily.   HYDROcodone-acetaminophen (NORCO/VICODIN) 5-325 MG tablet Take 2 tablets by mouth every 6 (six) hours as needed for moderate pain (pain score 4-6).   lidocaine-prilocaine (EMLA) cream Apply to small amount to port site and cover with occlusive dressing 1 hr prior to access.   losartan (COZAAR) 50 MG tablet Take 1 tablet (50 mg total) by mouth 2 (two) times daily. (Patient taking differently: Take 50 mg by mouth daily.)   metFORMIN (GLUCOPHAGE) 500 MG tablet Take 1 tablet (500 mg total) by mouth 2 (two) times daily. (Patient taking differently: Take 500 mg by mouth 2 (two) times daily with a meal. Pt taking 500 mg in AM and 500mg  at night- endocrinology)   montelukast (SINGULAIR) 10 MG tablet TAKE 1 TABLET BY MOUTH AT BEDTIME   RELION PEN NEEDLES 32G X 4 MM MISC    sertraline (ZOLOFT) 50 MG tablet Take 1  tablet by mouth once daily   SYMBICORT 160-4.5 MCG/ACT inhaler Inhale 2 puffs into the lungs 2 (two) times daily.       08/16/2023    1:47 PM 02/08/2023    3:21 PM 09/15/2022    9:42 AM 08/24/2022    3:57 PM  GAD 7 : Generalized Anxiety Score  Nervous, Anxious, on Edge 1 0 2 3  Control/stop worrying 1 2 0 3  Worry too much - different things 1 2 0 3  Trouble relaxing 0 0 0 1  Restless 0 0 0 1  Easily annoyed or irritable 0 0 0 2  Afraid - awful might happen 0 0 0 1  Total GAD 7 Score 3 4 2 14   Anxiety Difficulty Somewhat difficult Not difficult at all Not difficult at all Very difficult       08/16/2023    1:46 PM 03/10/2023    9:22 AM 02/08/2023    3:19 PM  Depression screen PHQ 2/9  Decreased Interest 1 0 0  Down, Depressed, Hopeless 1 0 0  PHQ - 2 Score 2 0 0  Altered sleeping 0 0 0  Tired, decreased energy 0 0 1  Change in appetite 0 0 1  Feeling bad or failure about yourself  0 0 0  Trouble concentrating 0 0 0  Moving slowly or fidgety/restless 0 0 0  Suicidal thoughts 0 0 0  PHQ-9 Score 2 0 2  Difficult doing work/chores Not difficult at all Not difficult at all Somewhat difficult    BP Readings from Last 3 Encounters:  08/16/23 (!) 140/70  08/02/23 (!) 146/76  07/02/23 (!) 131/53    Physical Exam Vitals and nursing note reviewed. Exam conducted with a chaperone present.  Constitutional:      General: She is not in acute distress.    Appearance: She is not diaphoretic.  HENT:     Head: Normocephalic and atraumatic.     Right Ear: Tympanic membrane and external ear normal. There is no impacted cerumen.     Left Ear: Tympanic membrane and external ear normal. There is no impacted cerumen.     Nose: Nose normal. No congestion or rhinorrhea.     Mouth/Throat:     Mouth: Mucous membranes are moist.  Eyes:     General:        Right eye: No discharge.        Left eye: No discharge.     Conjunctiva/sclera: Conjunctivae normal.     Pupils: Pupils are equal, round,  and reactive  to light.  Neck:     Thyroid: No thyromegaly.     Vascular: No JVD.  Cardiovascular:     Rate and Rhythm: Normal rate and regular rhythm.     Heart sounds: Normal heart sounds. No murmur heard.    No friction rub. No gallop.  Pulmonary:     Effort: Pulmonary effort is normal.     Breath sounds: Normal breath sounds. No wheezing, rhonchi or rales.  Abdominal:     General: Bowel sounds are normal.     Palpations: Abdomen is soft. There is no mass.     Tenderness: There is no abdominal tenderness. There is no guarding.  Musculoskeletal:        General: Normal range of motion.     Cervical back: Normal range of motion and neck supple.  Lymphadenopathy:     Cervical: No cervical adenopathy.  Skin:    General: Skin is warm and dry.  Neurological:     Mental Status: She is alert.     Wt Readings from Last 3 Encounters:  08/16/23 117 lb (53.1 kg)  08/02/23 118 lb 3.2 oz (53.6 kg)  07/02/23 124 lb 9 oz (56.5 kg)    BP (!) 140/70   Pulse 81   Ht 5\' 2"  (1.575 m)   Wt 117 lb (53.1 kg)   SpO2 97%   BMI 21.40 kg/m   Assessment and Plan: 1. Essential hypertension Chronic.  Controlled.  Stable.  Blood pressure 137/67.  Asymptomatic.  Tolerating medications well.  Continue losartan 50 mg once a day.  Review of previous labs are acceptable. - losartan (COZAAR) 50 MG tablet; Take 1 tablet (50 mg total) by mouth daily.  Dispense: 90 tablet; Refill: 1  2. Recurrent major depressive disorder, in partial remission (HCC) Chronic.  Controlled.  Stable.  PHQ is 2 GAD score is 3 patient is tolerating current dosing but would like to go up to 75 mg and we have increased to 1-1/2 tablets once a day and will recheck in 6 months. - sertraline (ZOLOFT) 50 MG tablet; Take 1 tablet (50 mg total) by mouth daily.  Dispense: 135 tablet; Refill: 1  3. Type 2 diabetes mellitus without complication, without long-term current use of insulin (HCC) Chronic.  Controlled.  Stable.  Followed by  endocrinology at [facility].  Will check microalbuminuria. - Microalbumin / creatinine urine ratio  4. Reactive depression As noted above. - sertraline (ZOLOFT) 50 MG tablet; Take 1 tablet (50 mg total) by mouth daily.  Dispense: 135 tablet; Refill: 1     Elizabeth Sauer, MD

## 2023-08-17 DIAGNOSIS — Z794 Long term (current) use of insulin: Secondary | ICD-10-CM | POA: Diagnosis not present

## 2023-08-17 DIAGNOSIS — Z881 Allergy status to other antibiotic agents status: Secondary | ICD-10-CM | POA: Diagnosis not present

## 2023-08-17 DIAGNOSIS — C3490 Malignant neoplasm of unspecified part of unspecified bronchus or lung: Secondary | ICD-10-CM | POA: Diagnosis not present

## 2023-08-17 DIAGNOSIS — C7931 Secondary malignant neoplasm of brain: Secondary | ICD-10-CM | POA: Diagnosis not present

## 2023-08-17 DIAGNOSIS — Z87891 Personal history of nicotine dependence: Secondary | ICD-10-CM | POA: Diagnosis not present

## 2023-08-17 DIAGNOSIS — Z79899 Other long term (current) drug therapy: Secondary | ICD-10-CM | POA: Diagnosis not present

## 2023-08-17 DIAGNOSIS — Z79891 Long term (current) use of opiate analgesic: Secondary | ICD-10-CM | POA: Diagnosis not present

## 2023-08-17 DIAGNOSIS — R9389 Abnormal findings on diagnostic imaging of other specified body structures: Secondary | ICD-10-CM | POA: Diagnosis not present

## 2023-08-17 DIAGNOSIS — E119 Type 2 diabetes mellitus without complications: Secondary | ICD-10-CM | POA: Diagnosis not present

## 2023-08-17 DIAGNOSIS — R5383 Other fatigue: Secondary | ICD-10-CM | POA: Diagnosis not present

## 2023-08-17 DIAGNOSIS — Z880 Allergy status to penicillin: Secondary | ICD-10-CM | POA: Diagnosis not present

## 2023-08-17 DIAGNOSIS — Z882 Allergy status to sulfonamides status: Secondary | ICD-10-CM | POA: Diagnosis not present

## 2023-08-17 DIAGNOSIS — I10 Essential (primary) hypertension: Secondary | ICD-10-CM | POA: Diagnosis not present

## 2023-08-17 DIAGNOSIS — Z1159 Encounter for screening for other viral diseases: Secondary | ICD-10-CM | POA: Diagnosis not present

## 2023-08-17 DIAGNOSIS — E7849 Other hyperlipidemia: Secondary | ICD-10-CM | POA: Diagnosis not present

## 2023-08-17 LAB — MICROALBUMIN / CREATININE URINE RATIO
Creatinine, Urine: 41.5 mg/dL
Microalb/Creat Ratio: 36 mg/g{creat} — ABNORMAL HIGH (ref 0–29)
Microalbumin, Urine: 14.9 ug/mL

## 2023-08-18 ENCOUNTER — Encounter: Payer: Self-pay | Admitting: Family Medicine

## 2023-08-18 DIAGNOSIS — E119 Type 2 diabetes mellitus without complications: Secondary | ICD-10-CM | POA: Diagnosis not present

## 2023-08-23 ENCOUNTER — Ambulatory Visit: Payer: Medicare Other | Admitting: Cardiovascular Disease

## 2023-08-28 ENCOUNTER — Other Ambulatory Visit: Payer: Self-pay | Admitting: Family Medicine

## 2023-08-28 DIAGNOSIS — E7849 Other hyperlipidemia: Secondary | ICD-10-CM

## 2023-09-02 DIAGNOSIS — J9601 Acute respiratory failure with hypoxia: Secondary | ICD-10-CM | POA: Diagnosis not present

## 2023-09-02 DIAGNOSIS — J441 Chronic obstructive pulmonary disease with (acute) exacerbation: Secondary | ICD-10-CM | POA: Diagnosis not present

## 2023-09-06 ENCOUNTER — Other Ambulatory Visit: Payer: Self-pay | Admitting: Family Medicine

## 2023-09-06 DIAGNOSIS — J301 Allergic rhinitis due to pollen: Secondary | ICD-10-CM

## 2023-09-06 DIAGNOSIS — Z1159 Encounter for screening for other viral diseases: Secondary | ICD-10-CM | POA: Diagnosis not present

## 2023-09-06 DIAGNOSIS — R9389 Abnormal findings on diagnostic imaging of other specified body structures: Secondary | ICD-10-CM | POA: Diagnosis not present

## 2023-09-06 DIAGNOSIS — C7931 Secondary malignant neoplasm of brain: Secondary | ICD-10-CM | POA: Diagnosis not present

## 2023-09-06 DIAGNOSIS — Z5111 Encounter for antineoplastic chemotherapy: Secondary | ICD-10-CM | POA: Diagnosis not present

## 2023-09-06 DIAGNOSIS — Z5112 Encounter for antineoplastic immunotherapy: Secondary | ICD-10-CM | POA: Diagnosis not present

## 2023-09-06 DIAGNOSIS — C3490 Malignant neoplasm of unspecified part of unspecified bronchus or lung: Secondary | ICD-10-CM | POA: Diagnosis not present

## 2023-09-10 DIAGNOSIS — M810 Age-related osteoporosis without current pathological fracture: Secondary | ICD-10-CM | POA: Diagnosis not present

## 2023-09-10 DIAGNOSIS — E119 Type 2 diabetes mellitus without complications: Secondary | ICD-10-CM | POA: Diagnosis not present

## 2023-09-10 DIAGNOSIS — E1169 Type 2 diabetes mellitus with other specified complication: Secondary | ICD-10-CM | POA: Diagnosis not present

## 2023-09-10 DIAGNOSIS — E785 Hyperlipidemia, unspecified: Secondary | ICD-10-CM | POA: Diagnosis not present

## 2023-09-20 DIAGNOSIS — E119 Type 2 diabetes mellitus without complications: Secondary | ICD-10-CM | POA: Diagnosis not present

## 2023-09-20 DIAGNOSIS — H353131 Nonexudative age-related macular degeneration, bilateral, early dry stage: Secondary | ICD-10-CM | POA: Diagnosis not present

## 2023-09-20 LAB — HM DIABETES EYE EXAM

## 2023-09-28 DIAGNOSIS — G9389 Other specified disorders of brain: Secondary | ICD-10-CM | POA: Diagnosis not present

## 2023-09-28 DIAGNOSIS — E785 Hyperlipidemia, unspecified: Secondary | ICD-10-CM | POA: Diagnosis not present

## 2023-09-28 DIAGNOSIS — Z7901 Long term (current) use of anticoagulants: Secondary | ICD-10-CM | POA: Diagnosis not present

## 2023-09-28 DIAGNOSIS — C7931 Secondary malignant neoplasm of brain: Secondary | ICD-10-CM | POA: Diagnosis not present

## 2023-09-28 DIAGNOSIS — Z7951 Long term (current) use of inhaled steroids: Secondary | ICD-10-CM | POA: Diagnosis not present

## 2023-09-28 DIAGNOSIS — E039 Hypothyroidism, unspecified: Secondary | ICD-10-CM | POA: Diagnosis not present

## 2023-09-28 DIAGNOSIS — E7849 Other hyperlipidemia: Secondary | ICD-10-CM | POA: Diagnosis not present

## 2023-09-28 DIAGNOSIS — I10 Essential (primary) hypertension: Secondary | ICD-10-CM | POA: Diagnosis not present

## 2023-09-28 DIAGNOSIS — E1169 Type 2 diabetes mellitus with other specified complication: Secondary | ICD-10-CM | POA: Diagnosis not present

## 2023-09-28 DIAGNOSIS — Z7984 Long term (current) use of oral hypoglycemic drugs: Secondary | ICD-10-CM | POA: Diagnosis not present

## 2023-09-28 DIAGNOSIS — J4489 Other specified chronic obstructive pulmonary disease: Secondary | ICD-10-CM | POA: Diagnosis not present

## 2023-09-28 DIAGNOSIS — R918 Other nonspecific abnormal finding of lung field: Secondary | ICD-10-CM | POA: Diagnosis not present

## 2023-09-28 DIAGNOSIS — Z87891 Personal history of nicotine dependence: Secondary | ICD-10-CM | POA: Diagnosis not present

## 2023-09-28 DIAGNOSIS — Z79899 Other long term (current) drug therapy: Secondary | ICD-10-CM | POA: Diagnosis not present

## 2023-09-28 DIAGNOSIS — I878 Other specified disorders of veins: Secondary | ICD-10-CM | POA: Diagnosis not present

## 2023-09-28 DIAGNOSIS — E119 Type 2 diabetes mellitus without complications: Secondary | ICD-10-CM | POA: Diagnosis not present

## 2023-09-28 DIAGNOSIS — R9389 Abnormal findings on diagnostic imaging of other specified body structures: Secondary | ICD-10-CM | POA: Diagnosis not present

## 2023-09-28 DIAGNOSIS — Z1159 Encounter for screening for other viral diseases: Secondary | ICD-10-CM | POA: Diagnosis not present

## 2023-09-28 DIAGNOSIS — Z880 Allergy status to penicillin: Secondary | ICD-10-CM | POA: Diagnosis not present

## 2023-09-28 DIAGNOSIS — Z882 Allergy status to sulfonamides status: Secondary | ICD-10-CM | POA: Diagnosis not present

## 2023-09-28 DIAGNOSIS — C3490 Malignant neoplasm of unspecified part of unspecified bronchus or lung: Secondary | ICD-10-CM | POA: Diagnosis not present

## 2023-09-28 DIAGNOSIS — Z794 Long term (current) use of insulin: Secondary | ICD-10-CM | POA: Diagnosis not present

## 2023-10-03 DIAGNOSIS — J441 Chronic obstructive pulmonary disease with (acute) exacerbation: Secondary | ICD-10-CM | POA: Diagnosis not present

## 2023-10-03 DIAGNOSIS — J9601 Acute respiratory failure with hypoxia: Secondary | ICD-10-CM | POA: Diagnosis not present

## 2023-10-12 DIAGNOSIS — C7931 Secondary malignant neoplasm of brain: Secondary | ICD-10-CM | POA: Diagnosis not present

## 2023-10-12 DIAGNOSIS — I2699 Other pulmonary embolism without acute cor pulmonale: Secondary | ICD-10-CM | POA: Diagnosis not present

## 2023-10-12 DIAGNOSIS — I10 Essential (primary) hypertension: Secondary | ICD-10-CM | POA: Diagnosis not present

## 2023-10-12 DIAGNOSIS — Z87891 Personal history of nicotine dependence: Secondary | ICD-10-CM | POA: Diagnosis not present

## 2023-10-12 DIAGNOSIS — E1169 Type 2 diabetes mellitus with other specified complication: Secondary | ICD-10-CM | POA: Diagnosis not present

## 2023-10-12 DIAGNOSIS — J449 Chronic obstructive pulmonary disease, unspecified: Secondary | ICD-10-CM | POA: Diagnosis not present

## 2023-10-12 DIAGNOSIS — E785 Hyperlipidemia, unspecified: Secondary | ICD-10-CM | POA: Diagnosis not present

## 2023-10-12 DIAGNOSIS — C3490 Malignant neoplasm of unspecified part of unspecified bronchus or lung: Secondary | ICD-10-CM | POA: Diagnosis not present

## 2023-10-15 DIAGNOSIS — C3412 Malignant neoplasm of upper lobe, left bronchus or lung: Secondary | ICD-10-CM | POA: Diagnosis not present

## 2023-10-15 DIAGNOSIS — C7931 Secondary malignant neoplasm of brain: Secondary | ICD-10-CM | POA: Diagnosis not present

## 2023-10-15 DIAGNOSIS — C3490 Malignant neoplasm of unspecified part of unspecified bronchus or lung: Secondary | ICD-10-CM | POA: Diagnosis not present

## 2023-10-19 DIAGNOSIS — C7931 Secondary malignant neoplasm of brain: Secondary | ICD-10-CM | POA: Diagnosis not present

## 2023-10-19 DIAGNOSIS — Z880 Allergy status to penicillin: Secondary | ICD-10-CM | POA: Diagnosis not present

## 2023-10-19 DIAGNOSIS — Z1159 Encounter for screening for other viral diseases: Secondary | ICD-10-CM | POA: Diagnosis not present

## 2023-10-19 DIAGNOSIS — Z87891 Personal history of nicotine dependence: Secondary | ICD-10-CM | POA: Diagnosis not present

## 2023-10-19 DIAGNOSIS — Z79891 Long term (current) use of opiate analgesic: Secondary | ICD-10-CM | POA: Diagnosis not present

## 2023-10-19 DIAGNOSIS — Z79899 Other long term (current) drug therapy: Secondary | ICD-10-CM | POA: Diagnosis not present

## 2023-10-19 DIAGNOSIS — M609 Myositis, unspecified: Secondary | ICD-10-CM | POA: Diagnosis not present

## 2023-10-19 DIAGNOSIS — Z7901 Long term (current) use of anticoagulants: Secondary | ICD-10-CM | POA: Diagnosis not present

## 2023-10-19 DIAGNOSIS — Z923 Personal history of irradiation: Secondary | ICD-10-CM | POA: Diagnosis not present

## 2023-10-19 DIAGNOSIS — E039 Hypothyroidism, unspecified: Secondary | ICD-10-CM | POA: Diagnosis not present

## 2023-10-19 DIAGNOSIS — E119 Type 2 diabetes mellitus without complications: Secondary | ICD-10-CM | POA: Diagnosis not present

## 2023-10-19 DIAGNOSIS — Z881 Allergy status to other antibiotic agents status: Secondary | ICD-10-CM | POA: Diagnosis not present

## 2023-10-19 DIAGNOSIS — Z91041 Radiographic dye allergy status: Secondary | ICD-10-CM | POA: Diagnosis not present

## 2023-10-19 DIAGNOSIS — Z7984 Long term (current) use of oral hypoglycemic drugs: Secondary | ICD-10-CM | POA: Diagnosis not present

## 2023-10-19 DIAGNOSIS — I10 Essential (primary) hypertension: Secondary | ICD-10-CM | POA: Diagnosis not present

## 2023-10-19 DIAGNOSIS — C3490 Malignant neoplasm of unspecified part of unspecified bronchus or lung: Secondary | ICD-10-CM | POA: Diagnosis not present

## 2023-10-19 DIAGNOSIS — R9389 Abnormal findings on diagnostic imaging of other specified body structures: Secondary | ICD-10-CM | POA: Diagnosis not present

## 2023-10-19 DIAGNOSIS — Z794 Long term (current) use of insulin: Secondary | ICD-10-CM | POA: Diagnosis not present

## 2023-10-19 DIAGNOSIS — C3412 Malignant neoplasm of upper lobe, left bronchus or lung: Secondary | ICD-10-CM | POA: Diagnosis not present

## 2023-10-19 DIAGNOSIS — Z882 Allergy status to sulfonamides status: Secondary | ICD-10-CM | POA: Diagnosis not present

## 2023-10-19 DIAGNOSIS — E7849 Other hyperlipidemia: Secondary | ICD-10-CM | POA: Diagnosis not present

## 2023-11-03 DIAGNOSIS — J9601 Acute respiratory failure with hypoxia: Secondary | ICD-10-CM | POA: Diagnosis not present

## 2023-11-03 DIAGNOSIS — J441 Chronic obstructive pulmonary disease with (acute) exacerbation: Secondary | ICD-10-CM | POA: Diagnosis not present

## 2023-11-15 DIAGNOSIS — E785 Hyperlipidemia, unspecified: Secondary | ICD-10-CM | POA: Diagnosis not present

## 2023-11-15 DIAGNOSIS — E1169 Type 2 diabetes mellitus with other specified complication: Secondary | ICD-10-CM | POA: Diagnosis not present

## 2023-11-18 DIAGNOSIS — E119 Type 2 diabetes mellitus without complications: Secondary | ICD-10-CM | POA: Diagnosis not present

## 2023-12-01 DIAGNOSIS — J9601 Acute respiratory failure with hypoxia: Secondary | ICD-10-CM | POA: Diagnosis not present

## 2023-12-01 DIAGNOSIS — J441 Chronic obstructive pulmonary disease with (acute) exacerbation: Secondary | ICD-10-CM | POA: Diagnosis not present

## 2023-12-03 ENCOUNTER — Ambulatory Visit (INDEPENDENT_AMBULATORY_CARE_PROVIDER_SITE_OTHER): Payer: Medicare Other | Admitting: Vascular Surgery

## 2023-12-03 ENCOUNTER — Encounter (INDEPENDENT_AMBULATORY_CARE_PROVIDER_SITE_OTHER): Payer: Medicare Other

## 2023-12-03 DIAGNOSIS — Z79899 Other long term (current) drug therapy: Secondary | ICD-10-CM | POA: Diagnosis not present

## 2023-12-03 DIAGNOSIS — M19012 Primary osteoarthritis, left shoulder: Secondary | ICD-10-CM | POA: Diagnosis not present

## 2023-12-03 DIAGNOSIS — C7931 Secondary malignant neoplasm of brain: Secondary | ICD-10-CM | POA: Diagnosis not present

## 2023-12-03 DIAGNOSIS — M11211 Other chondrocalcinosis, right shoulder: Secondary | ICD-10-CM | POA: Diagnosis not present

## 2023-12-03 DIAGNOSIS — C3490 Malignant neoplasm of unspecified part of unspecified bronchus or lung: Secondary | ICD-10-CM | POA: Diagnosis not present

## 2023-12-03 DIAGNOSIS — M11212 Other chondrocalcinosis, left shoulder: Secondary | ICD-10-CM | POA: Diagnosis not present

## 2023-12-03 DIAGNOSIS — M19011 Primary osteoarthritis, right shoulder: Secondary | ICD-10-CM | POA: Diagnosis not present

## 2023-12-13 DIAGNOSIS — C3490 Malignant neoplasm of unspecified part of unspecified bronchus or lung: Secondary | ICD-10-CM | POA: Diagnosis not present

## 2023-12-13 DIAGNOSIS — R9389 Abnormal findings on diagnostic imaging of other specified body structures: Secondary | ICD-10-CM | POA: Diagnosis not present

## 2023-12-13 DIAGNOSIS — Z1159 Encounter for screening for other viral diseases: Secondary | ICD-10-CM | POA: Diagnosis not present

## 2023-12-13 DIAGNOSIS — C7931 Secondary malignant neoplasm of brain: Secondary | ICD-10-CM | POA: Diagnosis not present

## 2024-01-01 DIAGNOSIS — J441 Chronic obstructive pulmonary disease with (acute) exacerbation: Secondary | ICD-10-CM | POA: Diagnosis not present

## 2024-01-01 DIAGNOSIS — J9601 Acute respiratory failure with hypoxia: Secondary | ICD-10-CM | POA: Diagnosis not present

## 2024-01-18 DIAGNOSIS — E119 Type 2 diabetes mellitus without complications: Secondary | ICD-10-CM | POA: Diagnosis not present

## 2024-01-18 DIAGNOSIS — Z881 Allergy status to other antibiotic agents status: Secondary | ICD-10-CM | POA: Diagnosis not present

## 2024-01-18 DIAGNOSIS — L299 Pruritus, unspecified: Secondary | ICD-10-CM | POA: Diagnosis not present

## 2024-01-18 DIAGNOSIS — R0602 Shortness of breath: Secondary | ICD-10-CM | POA: Diagnosis not present

## 2024-01-18 DIAGNOSIS — R936 Abnormal findings on diagnostic imaging of limbs: Secondary | ICD-10-CM | POA: Diagnosis not present

## 2024-01-18 DIAGNOSIS — Z7901 Long term (current) use of anticoagulants: Secondary | ICD-10-CM | POA: Diagnosis not present

## 2024-01-18 DIAGNOSIS — E069 Thyroiditis, unspecified: Secondary | ICD-10-CM | POA: Diagnosis not present

## 2024-01-18 DIAGNOSIS — R911 Solitary pulmonary nodule: Secondary | ICD-10-CM | POA: Diagnosis not present

## 2024-01-18 DIAGNOSIS — N3281 Overactive bladder: Secondary | ICD-10-CM | POA: Diagnosis not present

## 2024-01-18 DIAGNOSIS — C7931 Secondary malignant neoplasm of brain: Secondary | ICD-10-CM | POA: Diagnosis not present

## 2024-01-18 DIAGNOSIS — M75121 Complete rotator cuff tear or rupture of right shoulder, not specified as traumatic: Secondary | ICD-10-CM | POA: Diagnosis not present

## 2024-01-18 DIAGNOSIS — Z87891 Personal history of nicotine dependence: Secondary | ICD-10-CM | POA: Diagnosis not present

## 2024-01-18 DIAGNOSIS — Z91041 Radiographic dye allergy status: Secondary | ICD-10-CM | POA: Diagnosis not present

## 2024-01-18 DIAGNOSIS — J3089 Other allergic rhinitis: Secondary | ICD-10-CM | POA: Diagnosis not present

## 2024-01-18 DIAGNOSIS — R9389 Abnormal findings on diagnostic imaging of other specified body structures: Secondary | ICD-10-CM | POA: Diagnosis not present

## 2024-01-18 DIAGNOSIS — Z1159 Encounter for screening for other viral diseases: Secondary | ICD-10-CM | POA: Diagnosis not present

## 2024-01-18 DIAGNOSIS — Z79899 Other long term (current) drug therapy: Secondary | ICD-10-CM | POA: Diagnosis not present

## 2024-01-18 DIAGNOSIS — E7849 Other hyperlipidemia: Secondary | ICD-10-CM | POA: Diagnosis not present

## 2024-01-18 DIAGNOSIS — M19011 Primary osteoarthritis, right shoulder: Secondary | ICD-10-CM | POA: Diagnosis not present

## 2024-01-18 DIAGNOSIS — C3490 Malignant neoplasm of unspecified part of unspecified bronchus or lung: Secondary | ICD-10-CM | POA: Diagnosis not present

## 2024-01-18 DIAGNOSIS — Z794 Long term (current) use of insulin: Secondary | ICD-10-CM | POA: Diagnosis not present

## 2024-01-18 DIAGNOSIS — Z880 Allergy status to penicillin: Secondary | ICD-10-CM | POA: Diagnosis not present

## 2024-01-18 DIAGNOSIS — C3412 Malignant neoplasm of upper lobe, left bronchus or lung: Secondary | ICD-10-CM | POA: Diagnosis not present

## 2024-01-18 DIAGNOSIS — Z882 Allergy status to sulfonamides status: Secondary | ICD-10-CM | POA: Diagnosis not present

## 2024-01-18 DIAGNOSIS — Z7952 Long term (current) use of systemic steroids: Secondary | ICD-10-CM | POA: Diagnosis not present

## 2024-01-18 DIAGNOSIS — I10 Essential (primary) hypertension: Secondary | ICD-10-CM | POA: Diagnosis not present

## 2024-01-20 DIAGNOSIS — C7931 Secondary malignant neoplasm of brain: Secondary | ICD-10-CM | POA: Diagnosis not present

## 2024-01-20 DIAGNOSIS — C3490 Malignant neoplasm of unspecified part of unspecified bronchus or lung: Secondary | ICD-10-CM | POA: Diagnosis not present

## 2024-01-20 DIAGNOSIS — Z5112 Encounter for antineoplastic immunotherapy: Secondary | ICD-10-CM | POA: Diagnosis not present

## 2024-01-20 DIAGNOSIS — Z7289 Other problems related to lifestyle: Secondary | ICD-10-CM | POA: Diagnosis not present

## 2024-01-20 DIAGNOSIS — M353 Polymyalgia rheumatica: Secondary | ICD-10-CM | POA: Diagnosis not present

## 2024-01-24 LAB — LAB REPORT - SCANNED: HM Hepatitis Screen: NEGATIVE

## 2024-01-25 ENCOUNTER — Encounter (INDEPENDENT_AMBULATORY_CARE_PROVIDER_SITE_OTHER): Payer: Self-pay

## 2024-02-02 ENCOUNTER — Other Ambulatory Visit (INDEPENDENT_AMBULATORY_CARE_PROVIDER_SITE_OTHER): Payer: Self-pay | Admitting: Vascular Surgery

## 2024-02-02 DIAGNOSIS — I6522 Occlusion and stenosis of left carotid artery: Secondary | ICD-10-CM

## 2024-02-02 DIAGNOSIS — I7143 Infrarenal abdominal aortic aneurysm, without rupture: Secondary | ICD-10-CM

## 2024-02-03 ENCOUNTER — Other Ambulatory Visit: Payer: Self-pay | Admitting: Family Medicine

## 2024-02-03 ENCOUNTER — Ambulatory Visit (INDEPENDENT_AMBULATORY_CARE_PROVIDER_SITE_OTHER): Payer: Medicare Other

## 2024-02-03 ENCOUNTER — Ambulatory Visit (INDEPENDENT_AMBULATORY_CARE_PROVIDER_SITE_OTHER): Payer: Medicare Other | Admitting: Nurse Practitioner

## 2024-02-03 DIAGNOSIS — J301 Allergic rhinitis due to pollen: Secondary | ICD-10-CM

## 2024-02-03 DIAGNOSIS — E1169 Type 2 diabetes mellitus with other specified complication: Secondary | ICD-10-CM | POA: Diagnosis not present

## 2024-02-03 DIAGNOSIS — I6522 Occlusion and stenosis of left carotid artery: Secondary | ICD-10-CM

## 2024-02-03 DIAGNOSIS — M810 Age-related osteoporosis without current pathological fracture: Secondary | ICD-10-CM | POA: Diagnosis not present

## 2024-02-03 DIAGNOSIS — I7143 Infrarenal abdominal aortic aneurysm, without rupture: Secondary | ICD-10-CM | POA: Diagnosis not present

## 2024-02-03 DIAGNOSIS — E785 Hyperlipidemia, unspecified: Secondary | ICD-10-CM | POA: Diagnosis not present

## 2024-02-03 DIAGNOSIS — I10 Essential (primary) hypertension: Secondary | ICD-10-CM | POA: Diagnosis not present

## 2024-02-05 NOTE — Progress Notes (Unsigned)
 MRN : [id number]  Jill Guzman is a 83 y.o. (Jun 27, 1941) female who presents with chief complaint of check circulation.  History of Present Illness:   The patient returns to the office for surveillance of an abdominal aortic aneurysm status post stent graft placement on 06/30/2023.    Procedure: PROCEDURE: 1.US  guidance for vascular access, bilateral femoral arteries 2.Catheter placement into aorta from bilateral femoral approaches 3.Placement of a C3 23 x 12 x 12 Gore Excluder Endoprosthesis main body with a 12 x 10 contralateral limb 4.         Placement of a 12 mm x 38 mm Lifestream stent postdilated to 14 mm in the contralateral gate to treat persistent narrowing of the limb secondary to the aneurysm morphology.   5.ProGlide closure devices bilateral femoral arteries     Patient denies abdominal pain or back pain, no other abdominal complaints.  No symptoms consistent with distal embolization No changes in claudication distance or new rest pain symptoms. No interval development of new ulcers or wounds   There have been no significant interval changes in his overall healthcare since his last visit.    Patient denies amaurosis fugax or TIA symptoms.  The patient denies recent episodes of angina or shortness of breath.    Duplex US  of the aorta and iliac arteries shows a 3.72 AAA sac with no  endoleak, decrease in the sac compared to the previous study.  Duplex ultrasound of the carotid arteries dated Feb 03, 2024 demonstrates RICA 1-39% and LICA occlusion previously documented in September 2024 by MRA of the neck.  No outpatient medications have been marked as taking for the 02/07/24 encounter (Appointment) with Prescilla Brod, Ninette Basque, MD.    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   a.) US  aorta 07/09/2020: 4.2 cm   Anxiety    Asthma    Bell's palsy    age 37 and age 67    COPD (chronic obstructive pulmonary disease) (HCC)    Deaf, right    Depression    Diabetes mellitus without complication (HCC)    Full dentures    Hearing aid worn (LEFT)    Hyperlipidemia due to type 2 diabetes mellitus (HCC)    Hypertension    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)    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)    Nonexudative age-related macular degeneration, bilateral, early dry stage    On apixaban  therapy    Osteopenia    Shingles 08/13/2020   Varicose veins of legs    Vertigo    random, approx 1x/month    Past  Surgical History:  Procedure Laterality Date   ABDOMINAL HYSTERECTOMY     BREAST EXCISIONAL BIOPSY Left 1976   neg   CATARACT EXTRACTION W/ INTRAOCULAR LENS  IMPLANT, BILATERAL     COLONOSCOPY  2014   normal   cyst on bladder removed     cyst removed breast Left    ENDOVASCULAR REPAIR/STENT GRAFT N/A 06/30/2023   Procedure: ENDOVASCULAR REPAIR/STENT GRAFT;  Surgeon: Celso College, MD;  Location: [facility];  Service: Cardiovascular;  Laterality: N/A;   KNEE ARTHROSCOPY WITH MEDIAL MENISECTOMY Left 10/20/2019   Procedure: KNEE ARTHROSCOPY WITH PARTIAL MEDIAL MENISECTOMY;  Surgeon: Lorri Rota, MD;  Location: [facility];  Service: Orthopedics;  Laterality: Left;  DIABETIC - oral meds   PARTIAL HYSTERECTOMY     TUBAL LIGATION      Social History Social History   Tobacco Use   Smoking status: Former    Current packs/day: 0.00    Average packs/day: 2.0 packs/day for 10.0  years (20.0 ttl pk-yrs)    Types: Cigarettes    Start date: 05/10/1989    Quit date: 05/11/1999    Years since quitting: 24.7   Smokeless tobacco: Never   Tobacco comments:    Smoking cessation materials not required  Vaping Use   Vaping status: Never Used  Substance Use Topics   Alcohol use: Never   Drug use: Never    Family History Family History  Problem Relation Age of Onset   Diabetes Mother    Stroke Mother    Healthy Daughter    Cancer Son 58       lung   Hypertension Son    Rheum arthritis Daughter    Hypertension Daughter    Arthritis Daughter    COPD Son    Breast cancer Neg Hx     Allergies  Allergen Reactions   Cefepime Rash    Tolerated 3rd generation cephalosporin (CEFTRIAXONE) 08/18/2022 with no documented ADRs.    Penicillins Itching    Tolerated 3rd generation cephalosporin (CEFTRIAXONE) 08/18/2022 with no documented ADRs.    Sulfa Antibiotics Itching   Iodinated Contrast Media Itching    Redness and itchiness   Vancomycin Rash     REVIEW OF SYSTEMS (Negative unless checked)  Constitutional: [] Weight loss  [] Fever  [] Chills Cardiac: [] Chest pain   [] Chest pressure   [] Palpitations   [] Shortness of breath when laying flat   [] Shortness of breath with exertion. Vascular:  [x] Pain in legs with walking   [] Pain in legs at rest  [] History of DVT   [] Phlebitis   [] Swelling in legs   [] Varicose veins   [] Non-healing ulcers Pulmonary:   [] Uses home oxygen   [] Productive cough   [] Hemoptysis   [] Wheeze  [] COPD   [] Asthma Neurologic:  [] Dizziness   [] Seizures   [] History of stroke   [] History of TIA  [] Aphasia   [] Vissual changes   [] Weakness or numbness in arm   [] Weakness or numbness in leg Musculoskeletal:   [] Joint swelling   [] Joint pain   [] Low back pain Hematologic:  [] Easy bruising  [] Easy bleeding   [] Hypercoagulable state   [] Anemic Gastrointestinal:  [] Diarrhea   [] Vomiting  [] Gastroesophageal reflux/heartburn   [] Difficulty  swallowing. Genitourinary:  [] Chronic kidney disease   [] Difficult urination  [] Frequent urination   [] Blood in urine Skin:  [] Rashes   [] Ulcers  Psychological:  [] History of anxiety   []  History of major depression.  Physical Examination  There were no vitals filed for this visit.  There is no height or weight on file to calculate BMI. Gen: WD/WN, NAD Head: [place]/AT, No temporalis wasting.  Ear/Nose/Throat: Hearing grossly intact, nares w/o erythema or drainage Eyes: PER, EOMI, sclera nonicteric.  Neck: Supple, no masses.  No bruit or JVD.  Pulmonary:  Good air movement, no audible wheezing, no use of accessory muscles.  Cardiac: RRR, normal S1, S2, no Murmurs. Vascular:  mild trophic changes, no open wounds Vessel Right Left  Radial Palpable Palpable  PT Not Palpable Not Palpable  DP Not Palpable Not Palpable  Gastrointestinal: soft, non-distended. No guarding/no peritoneal signs.  Musculoskeletal: M/S 5/5 throughout.  No visible deformity.  Neurologic: CN 2-12 intact. Pain and light touch intact in extremities.  Symmetrical.  Speech is fluent. Motor exam as listed above. Psychiatric: Judgment intact, Mood & affect appropriate for pt's clinical situation. Dermatologic: No rashes or ulcers noted.  No changes consistent with cellulitis.   CBC Lab Results  Component Value Date   WBC 12.0 (H) 07/02/2023   HGB 10.7 (L) 07/02/2023   HCT 33.2 (L) 07/02/2023   MCV 85.3 07/02/2023   PLT 252 07/02/2023    BMET    Component Value Date/Time   NA 132 (L) 07/02/2023 1805   NA 140 06/24/2022 0956   K 3.7 07/02/2023 1805   CL 97 (L) 07/02/2023 1805   CO2 25 07/02/2023 1805   GLUCOSE 151 (H) 07/02/2023 1805   BUN 12 07/02/2023 1805   BUN 12 06/24/2022 0956   CREATININE 0.63 07/02/2023 1805   CALCIUM  9.6 07/02/2023 1805   GFRNONAA >60 07/02/2023 1805   GFRAA 94 11/15/2019 1141   CrCl cannot be calculated (Patient's most recent lab result is older than the maximum 21 days  allowed.).  COAG Lab Results  Component Value Date   INR 1.2 06/30/2023    Radiology No results found.   Assessment/Plan There are no diagnoses linked to this encounter.   Devon Fogo, MD  02/05/2024 2:50 PM

## 2024-02-07 ENCOUNTER — Encounter (INDEPENDENT_AMBULATORY_CARE_PROVIDER_SITE_OTHER): Payer: Self-pay | Admitting: Vascular Surgery

## 2024-02-07 ENCOUNTER — Ambulatory Visit (INDEPENDENT_AMBULATORY_CARE_PROVIDER_SITE_OTHER): Admitting: Vascular Surgery

## 2024-02-07 VITALS — BP 180/77 | HR 68 | Resp 16 | Wt 115.2 lb

## 2024-02-07 DIAGNOSIS — E1169 Type 2 diabetes mellitus with other specified complication: Secondary | ICD-10-CM | POA: Diagnosis not present

## 2024-02-07 DIAGNOSIS — I6522 Occlusion and stenosis of left carotid artery: Secondary | ICD-10-CM

## 2024-02-07 DIAGNOSIS — E119 Type 2 diabetes mellitus without complications: Secondary | ICD-10-CM | POA: Diagnosis not present

## 2024-02-07 DIAGNOSIS — E785 Hyperlipidemia, unspecified: Secondary | ICD-10-CM

## 2024-02-07 DIAGNOSIS — I7143 Infrarenal abdominal aortic aneurysm, without rupture: Secondary | ICD-10-CM | POA: Diagnosis not present

## 2024-02-07 DIAGNOSIS — I2694 Multiple subsegmental pulmonary emboli without acute cor pulmonale: Secondary | ICD-10-CM | POA: Diagnosis not present

## 2024-02-07 DIAGNOSIS — I10 Essential (primary) hypertension: Secondary | ICD-10-CM

## 2024-02-08 ENCOUNTER — Encounter (INDEPENDENT_AMBULATORY_CARE_PROVIDER_SITE_OTHER): Payer: Self-pay | Admitting: Vascular Surgery

## 2024-02-10 DIAGNOSIS — M810 Age-related osteoporosis without current pathological fracture: Secondary | ICD-10-CM | POA: Diagnosis not present

## 2024-02-14 ENCOUNTER — Ambulatory Visit: Payer: Medicare Other | Admitting: Family Medicine

## 2024-02-14 DIAGNOSIS — Z0000 Encounter for general adult medical examination without abnormal findings: Secondary | ICD-10-CM | POA: Diagnosis not present

## 2024-02-14 DIAGNOSIS — I2699 Other pulmonary embolism without acute cor pulmonale: Secondary | ICD-10-CM | POA: Diagnosis not present

## 2024-02-14 DIAGNOSIS — M069 Rheumatoid arthritis, unspecified: Secondary | ICD-10-CM | POA: Diagnosis not present

## 2024-02-14 DIAGNOSIS — J449 Chronic obstructive pulmonary disease, unspecified: Secondary | ICD-10-CM | POA: Diagnosis not present

## 2024-02-14 DIAGNOSIS — M810 Age-related osteoporosis without current pathological fracture: Secondary | ICD-10-CM | POA: Diagnosis not present

## 2024-02-14 DIAGNOSIS — I10 Essential (primary) hypertension: Secondary | ICD-10-CM | POA: Diagnosis not present

## 2024-02-14 DIAGNOSIS — R413 Other amnesia: Secondary | ICD-10-CM | POA: Diagnosis not present

## 2024-02-14 DIAGNOSIS — Z794 Long term (current) use of insulin: Secondary | ICD-10-CM | POA: Diagnosis not present

## 2024-02-14 DIAGNOSIS — E119 Type 2 diabetes mellitus without complications: Secondary | ICD-10-CM | POA: Diagnosis not present

## 2024-02-14 DIAGNOSIS — E1169 Type 2 diabetes mellitus with other specified complication: Secondary | ICD-10-CM | POA: Diagnosis not present

## 2024-02-14 DIAGNOSIS — E785 Hyperlipidemia, unspecified: Secondary | ICD-10-CM | POA: Diagnosis not present

## 2024-02-14 DIAGNOSIS — Z87891 Personal history of nicotine dependence: Secondary | ICD-10-CM | POA: Diagnosis not present

## 2024-02-15 DIAGNOSIS — E119 Type 2 diabetes mellitus without complications: Secondary | ICD-10-CM | POA: Diagnosis not present

## 2024-02-17 DIAGNOSIS — C3490 Malignant neoplasm of unspecified part of unspecified bronchus or lung: Secondary | ICD-10-CM | POA: Diagnosis not present

## 2024-02-17 DIAGNOSIS — M353 Polymyalgia rheumatica: Secondary | ICD-10-CM | POA: Diagnosis not present

## 2024-02-17 DIAGNOSIS — C7931 Secondary malignant neoplasm of brain: Secondary | ICD-10-CM | POA: Diagnosis not present

## 2024-02-17 DIAGNOSIS — R9389 Abnormal findings on diagnostic imaging of other specified body structures: Secondary | ICD-10-CM | POA: Diagnosis not present

## 2024-02-17 DIAGNOSIS — Z1159 Encounter for screening for other viral diseases: Secondary | ICD-10-CM | POA: Diagnosis not present

## 2024-02-17 DIAGNOSIS — T508X5S Adverse effect of diagnostic agents, sequela: Secondary | ICD-10-CM | POA: Diagnosis not present

## 2024-03-01 DIAGNOSIS — M810 Age-related osteoporosis without current pathological fracture: Secondary | ICD-10-CM | POA: Diagnosis not present

## 2024-03-28 NOTE — Progress Notes (Signed)
 [facility] Quality Team Note  Name: Jill Guzman Date of Birth: 1941-08-02 MRN: [id number] Date: 03/28/2024  [facility] Quality Team has reviewed this patient's chart, please see recommendations below:  [facility] Quality Other; (Chart reviewed for KED measure. Patient appears to be seen by [facility] clinic. Will outreach.)

## 2024-04-28 ENCOUNTER — Other Ambulatory Visit: Payer: Self-pay

## 2024-05-02 ENCOUNTER — Other Ambulatory Visit: Payer: Self-pay | Admitting: Cardiovascular Disease

## 2024-05-02 DIAGNOSIS — E7849 Other hyperlipidemia: Secondary | ICD-10-CM

## 2024-06-11 ENCOUNTER — Other Ambulatory Visit: Payer: Self-pay | Admitting: Cardiovascular Disease

## 2024-07-09 ENCOUNTER — Other Ambulatory Visit: Payer: Self-pay | Admitting: Cardiovascular Disease

## 2024-07-28 ENCOUNTER — Other Ambulatory Visit: Payer: Self-pay | Admitting: Cardiovascular Disease

## 2024-07-28 DIAGNOSIS — E7849 Other hyperlipidemia: Secondary | ICD-10-CM

## 2024-07-28 NOTE — Telephone Encounter (Signed)
 Please contact pt for future appointment. Pt due for follow up.

## 2024-07-31 NOTE — Telephone Encounter (Signed)
Scheduled 01/13

## 2024-09-17 NOTE — Progress Notes (Unsigned)
 Cardiology Office Note  Date:  09/17/2024   ID:  Jill Guzman, DOB 1941-01-24, MRN [id number]  PCP:  No primary care provider on file.   No chief complaint on file.   HPI:  [name] is a 84 year old woman with past medical history of PAD AAA, carotid stenosis, iliac artery aneurysm Diabetes Former smoker  30s to 46s,  [employer]ile[employer]cot[employer] COPD, non-small cell lung cancer, on chemo Hypertension Pulmonary embolism May 2024 Who presents by referral from Dr. Marea for preop evaluation for AAA repair with endograft  LOV 10/24   Reports that she is active at baseline, yard work, ADLs, housework Denies any prior cardiac history, denies chest pain or shortness of breath concerning for angina  CT scan abdomen pelvis images pulled up and reviewed showing moderate aortic atherosclerosis, AAA  Discussed carotid ultrasound showing occluded left carotid, seen on MRA at [facility]  History of pulmonary embolism, treated with Eliquis , maintained on 5 twice daily 5/24: embolism CT chest Bilateral pulmonary emboli involving the right upper and left lower lobe segmental arteries.   Discussed oncologic history ED 06/29/22 with slurred speech, mispronouncing words, stuttering increased fatigue and poor coordination.  two brain mets, CyberKnife SRT completed 07/24/2022.  SRS to brain has been completed on 09/08/22. weaned off steroids.  08/19/2022, she was admitted after RSV, and was on 2 L of oxygen , was given fluids, and was subsequently discharged.   PET scan performed on 09/17/2022 demonstrated improvement in spiculated LUL nodule, and stable mediastinal lymph node.  09/08/22 completed RT to primary lung mass and R temporal lesion , tolerated well.  MRI 10/21/2022 showed good control @ radiated locations in brain, but also a new 2mm R posteroparietal lesion.   CT scan abdomen pelvis Enlargement of the distal abdominal aortic aneurysm measuring up to 4.8 cm and measured 4.5 cm on  07/31/2020. This abdominal aortic aneurysm has saccular characteristics  Lab work reviewed A1c 6.7 Total cholesterol 191  EKG personally reviewed by myself on todays visit      PMH:   has a past medical history of AAA (abdominal aortic aneurysm) (07/31/2020), AB (asthmatic bronchitis), Aneurysm of left common iliac artery (07/09/2020), Anxiety, Asthma, Bell's palsy, COPD (chronic obstructive pulmonary disease) (HCC), Deaf, right, Depression, Diabetes mellitus without complication (HCC), Full dentures, Hearing aid worn (LEFT), Hyperlipidemia due to type 2 diabetes mellitus (HCC), Hypertension, 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), 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  PSH:    Past Surgical History:  Procedure Laterality Date   ABDOMINAL HYSTERECTOMY     BREAST EXCISIONAL BIOPSY Left 1976   neg   CATARACT EXTRACTION W/ INTRAOCULAR LENS  IMPLANT, BILATERAL     COLONOSCOPY  2014   normal   cyst on bladder removed     cyst removed breast Left    ENDOVASCULAR STENT GRAFT (AAA) N/A 06/30/2023   Procedure: ENDOVASCULAR REPAIR/STENT GRAFT;  Surgeon: Marea Selinda GORMAN, MD;  Location: [facility];  Service: Cardiovascular;  Laterality: N/A;   KNEE ARTHROSCOPY WITH MEDIAL MENISECTOMY Left 10/20/2019   Procedure: KNEE ARTHROSCOPY WITH PARTIAL MEDIAL MENISECTOMY;  Surgeon: Tobie Priest, MD;  Location: [facility];  Service: Orthopedics;  Laterality: Left;  DIABETIC - oral meds   PARTIAL HYSTERECTOMY     TUBAL LIGATION      Current Outpatient Medications  Medication Sig Dispense  Refill   ACCU-CHEK GUIDE test strip USE 1 STRIP TO CHECK GLUCOSE FOR BLOOD SUGAR THREE TIMES DAILY AND FOR SYMPTOMS OF HIGH OR LOW BLOOD SUGAR     acetaminophen  (TYLENOL ) 500 MG  tablet Take by mouth.     albuterol  (VENTOLIN  HFA) 108 (90 Base) MCG/ACT inhaler INHALE 1 PUFF BY MOUTH 4 TIMES DAILY 18 g 5   apixaban  (ELIQUIS ) 5 MG TABS tablet Take 5 mg by mouth 2 (two) times daily.     atorvastatin  (LIPITOR) 10 MG tablet Take 1 tablet by mouth once daily 90 tablet 0   atorvastatin  (LIPITOR) 20 MG tablet Take 1 tablet by mouth once daily 90 tablet 0   Blood Glucose Monitoring Suppl (ACCU-CHEK GUIDE ME) w/Device KIT Test once daily 1 kit 0   Cholecalciferol (VITAMIN D) 50 MCG (2000 UT) CAPS Take by mouth.     ezetimibe  (ZETIA ) 10 MG tablet TAKE 1 TABLET BY MOUTH ONCE DAILY *APPOINTMENT NEEDED FOR ADDITIONAL REFILLS* 15 tablet 0   fluticasone  (FLONASE ) 50 MCG/ACT nasal spray Use 2 spray(s) in each nostril once daily 16 g 0   losartan  (COZAAR ) 50 MG tablet Take 1 tablet (50 mg total) by mouth daily. 90 tablet 1   metFORMIN  (GLUCOPHAGE ) 500 MG tablet Take 1 tablet (500 mg total) by mouth 2 (two) times daily. (Patient taking differently: Take 500 mg by mouth 2 (two) times daily with a meal. Pt taking 500 mg in AM and 500mg  at night- endocrinology) 180 tablet 6   montelukast  (SINGULAIR ) 10 MG tablet TAKE 1 TABLET BY MOUTH AT BEDTIME 90 tablet 0   predniSONE  (DELTASONE ) 5 MG tablet Take 5 mg by mouth as directed.     RELION PEN NEEDLES 32G X 4 MM MISC      sertraline  (ZOLOFT ) 50 MG tablet Take 1 tablet (50 mg total) by mouth daily. 135 tablet 1   SYMBICORT  160-4.5 MCG/ACT inhaler Inhale 2 puffs into the lungs 2 (two) times daily.     No current facility-administered medications for this visit.     Allergies:   Cefepime, Penicillins, Sulfa antibiotics, Iodinated contrast media, and Vancomycin   Social History:  The patient  reports that she quit smoking about 25 years ago. Her smoking use included cigarettes. She started smoking about 35 years ago. She has a 20 pack-year smoking history. She has never used smokeless tobacco. She reports that she does not drink alcohol and does not  use drugs.   Family History:   family history includes Arthritis in her daughter; COPD in her son; Cancer (age of onset: 30) in her son; Diabetes in her mother; Healthy in her daughter; Hypertension in her daughter and son; Rheum arthritis in her daughter; Stroke in her mother.    Review of Systems: Review of Systems  Constitutional: Negative.   HENT: Negative.    Respiratory: Negative.    Cardiovascular: Negative.   Gastrointestinal: Negative.   Musculoskeletal: Negative.   Neurological: Negative.   Psychiatric/Behavioral: Negative.    All other systems reviewed and are negative.    PHYSICAL EXAM: VS:  There were no vitals taken for this visit. , BMI There is no height or weight on file to calculate BMI. GEN: Well nourished, well developed, in no acute distress HEENT: normal Neck: no JVD, carotid bruits, or masses Cardiac: RRR; no murmurs, rubs, or gallops,no edema  Respiratory:  clear to auscultation bilaterally, normal work of breathing GI: soft, nontender, nondistended, + BS MS: no deformity or atrophy Skin: warm and  dry, no rash Neuro:  Strength and sensation are intact Psych: euthymic mood, full affect   Recent Labs: No results found for requested labs within last 365 days.    Lipid Panel Lab Results  Component Value Date   CHOL 191 06/24/2022   HDL 55 06/24/2022   LDLCALC 115 (H) 06/24/2022   TRIG 120 06/24/2022      Wt Readings from Last 3 Encounters:  02/07/24 115 lb 3.2 oz (52.3 kg)  08/16/23 117 lb (53.1 kg)  08/02/23 118 lb 3.2 oz (53.6 kg)      ASSESSMENT AND PLAN:  Problem List Items Addressed This Visit   None   Preop cardiovascular evaluation Preparing for AAA repair via endograft placement Increase in size over the past 2 years 4.8 cm Images pulled up and reviewed with her in detail Acceptable risk for procedure, no further cardiac testing needed Denies anginal symptoms Stressed importance of aggressive blood pressure control prior to  procedure  Essential hypertension Recent blood pressures have been running high, recommend she closely monitor blood pressure at home Recommend she increase losartan  up to 50 twice daily from daily, add amlodipine  5 daily She reports when she had RSV many of her blood pressure medications were held presumably for hypotension Recommend she call us  with blood pressure measurements over the next week  Aortic atherosclerosis Images pulled up and reviewed with her Stressed importance of aggressive lipid management  Hyperlipidemia Recommend she increase Lipitor from 10 up to 20, add Zetia  10 mg daily  Pulmonary embolism Reports she is followed by pulmonary, maintained on Eliquis  5 twice daily  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 [facility] [place], [place] [phone]

## 2024-09-19 ENCOUNTER — Ambulatory Visit: Admitting: Cardiovascular Disease

## 2024-09-19 DIAGNOSIS — I739 Peripheral vascular disease, unspecified: Secondary | ICD-10-CM

## 2024-09-19 DIAGNOSIS — I7143 Infrarenal abdominal aortic aneurysm, without rupture: Secondary | ICD-10-CM

## 2024-09-19 DIAGNOSIS — E119 Type 2 diabetes mellitus without complications: Secondary | ICD-10-CM

## 2024-09-19 DIAGNOSIS — I2694 Multiple subsegmental pulmonary emboli without acute cor pulmonale: Secondary | ICD-10-CM

## 2024-09-19 DIAGNOSIS — I723 Aneurysm of iliac artery: Secondary | ICD-10-CM

## 2024-09-19 DIAGNOSIS — E1169 Type 2 diabetes mellitus with other specified complication: Secondary | ICD-10-CM

## 2024-09-19 DIAGNOSIS — I10 Essential (primary) hypertension: Secondary | ICD-10-CM

## 2024-09-19 DIAGNOSIS — I6522 Occlusion and stenosis of left carotid artery: Secondary | ICD-10-CM

## 2025-02-09 ENCOUNTER — Ambulatory Visit (INDEPENDENT_AMBULATORY_CARE_PROVIDER_SITE_OTHER): Admitting: Vascular Surgery

## 2025-02-09 ENCOUNTER — Encounter (INDEPENDENT_AMBULATORY_CARE_PROVIDER_SITE_OTHER)
